# Patient Record
Sex: Male | Born: 1965
Health system: Southern US, Community
[De-identification: ages and names within clinical notes are randomized; demographics above are authoritative.]

## PROBLEM LIST (undated history)

## (undated) ENCOUNTER — Emergency Department (HOSPITAL_COMMUNITY): Admission: EM | Payer: Medicaid Other | Source: Home / Self Care

## (undated) DIAGNOSIS — R519 Headache, unspecified: Secondary | ICD-10-CM

## (undated) DIAGNOSIS — S51002A Unspecified open wound of left elbow, initial encounter: Secondary | ICD-10-CM

## (undated) DIAGNOSIS — K219 Gastro-esophageal reflux disease without esophagitis: Secondary | ICD-10-CM

## (undated) DIAGNOSIS — F419 Anxiety disorder, unspecified: Secondary | ICD-10-CM

## (undated) DIAGNOSIS — F319 Bipolar disorder, unspecified: Secondary | ICD-10-CM

## (undated) DIAGNOSIS — I700 Atherosclerosis of aorta: Secondary | ICD-10-CM

## (undated) DIAGNOSIS — F909 Attention-deficit hyperactivity disorder, unspecified type: Secondary | ICD-10-CM

## (undated) DIAGNOSIS — S53105A Unspecified dislocation of left ulnohumeral joint, initial encounter: Secondary | ICD-10-CM

## (undated) DIAGNOSIS — T8182XA Emphysema (subcutaneous) resulting from a procedure, initial encounter: Secondary | ICD-10-CM

## (undated) DIAGNOSIS — J439 Emphysema, unspecified: Secondary | ICD-10-CM

## (undated) DIAGNOSIS — R51 Headache: Secondary | ICD-10-CM

## (undated) DIAGNOSIS — T40601A Poisoning by unspecified narcotics, accidental (unintentional), initial encounter: Secondary | ICD-10-CM

## (undated) DIAGNOSIS — W5911XA Bitten by nonvenomous snake, initial encounter: Secondary | ICD-10-CM

## (undated) DIAGNOSIS — Z5189 Encounter for other specified aftercare: Secondary | ICD-10-CM

## (undated) DIAGNOSIS — R0600 Dyspnea, unspecified: Secondary | ICD-10-CM

## (undated) DIAGNOSIS — F32A Depression, unspecified: Secondary | ICD-10-CM

## (undated) DIAGNOSIS — J45909 Unspecified asthma, uncomplicated: Secondary | ICD-10-CM

## (undated) DIAGNOSIS — W3400XA Accidental discharge from unspecified firearms or gun, initial encounter: Secondary | ICD-10-CM

## (undated) DIAGNOSIS — I4891 Unspecified atrial fibrillation: Secondary | ICD-10-CM

## (undated) DIAGNOSIS — J449 Chronic obstructive pulmonary disease, unspecified: Secondary | ICD-10-CM

## (undated) HISTORY — DX: Emphysema, unspecified: J43.9

## (undated) HISTORY — PX: COLON SURGERY: SHX602

## (undated) HISTORY — DX: Encounter for other specified aftercare: Z51.89

## (undated) HISTORY — DX: Atherosclerosis of aorta: I70.0

## (undated) HISTORY — PX: LUNG SURGERY: SHX703

## (undated) HISTORY — PX: SPINE SURGERY: SHX786

## (undated) HISTORY — PX: HERNIA REPAIR: SHX51

## (undated) HISTORY — PX: FRACTURE SURGERY: SHX138

## (undated) HISTORY — DX: Unspecified dislocation of left ulnohumeral joint, initial encounter: S53.105A

## (undated) HISTORY — DX: Anxiety disorder, unspecified: F41.9

## (undated) HISTORY — DX: Depression, unspecified: F32.A

## (undated) HISTORY — DX: Unspecified dislocation of left ulnohumeral joint, initial encounter: S51.002A

## (undated) HISTORY — DX: Gastro-esophageal reflux disease without esophagitis: K21.9

---
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
## 1998-07-13 ENCOUNTER — Encounter: Payer: Self-pay | Admitting: General Surgery

## 1998-07-14 ENCOUNTER — Ambulatory Visit (HOSPITAL_COMMUNITY): Admission: RE | Admit: 1998-07-14 | Discharge: 1998-07-14 | Payer: Self-pay | Admitting: General Surgery

## 2000-01-30 ENCOUNTER — Emergency Department (HOSPITAL_COMMUNITY): Admission: EM | Admit: 2000-01-30 | Discharge: 2000-01-30 | Payer: Self-pay | Admitting: Emergency Medicine

## 2000-01-30 ENCOUNTER — Encounter: Payer: Self-pay | Admitting: Emergency Medicine

## 2000-04-09 ENCOUNTER — Emergency Department (HOSPITAL_COMMUNITY): Admission: EM | Admit: 2000-04-09 | Discharge: 2000-04-09 | Payer: Self-pay

## 2002-09-21 ENCOUNTER — Emergency Department (HOSPITAL_COMMUNITY): Admission: EM | Admit: 2002-09-21 | Discharge: 2002-09-21 | Payer: Self-pay

## 2004-02-12 ENCOUNTER — Emergency Department (HOSPITAL_COMMUNITY): Admission: EM | Admit: 2004-02-12 | Discharge: 2004-02-12 | Payer: Self-pay | Admitting: Emergency Medicine

## 2004-02-12 ENCOUNTER — Emergency Department (HOSPITAL_COMMUNITY): Admission: EM | Admit: 2004-02-12 | Discharge: 2004-02-13 | Payer: Self-pay | Admitting: Emergency Medicine
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
## 2004-04-03 ENCOUNTER — Emergency Department (HOSPITAL_COMMUNITY): Admission: EM | Admit: 2004-04-03 | Discharge: 2004-04-03 | Payer: Self-pay | Admitting: Emergency Medicine
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
## 2005-06-23 ENCOUNTER — Emergency Department (HOSPITAL_COMMUNITY): Admission: EM | Admit: 2005-06-23 | Discharge: 2005-06-23 | Payer: Self-pay | Admitting: Emergency Medicine

## 2010-07-19 ENCOUNTER — Emergency Department (HOSPITAL_COMMUNITY)
Admission: EM | Admit: 2010-07-19 | Discharge: 2010-07-19 | Payer: Self-pay | Source: Home / Self Care | Admitting: Emergency Medicine

## 2010-07-19 IMAGING — CR DG WRIST COMPLETE 3+V*L*
4 series · 4 of 4 positions shown · non-contrast
Comparison: None.

CLINICAL DATA: Left wrist injury with pain.

LEFT WRIST - COMPLETE 3+ VIEW

[x wrist pa left]
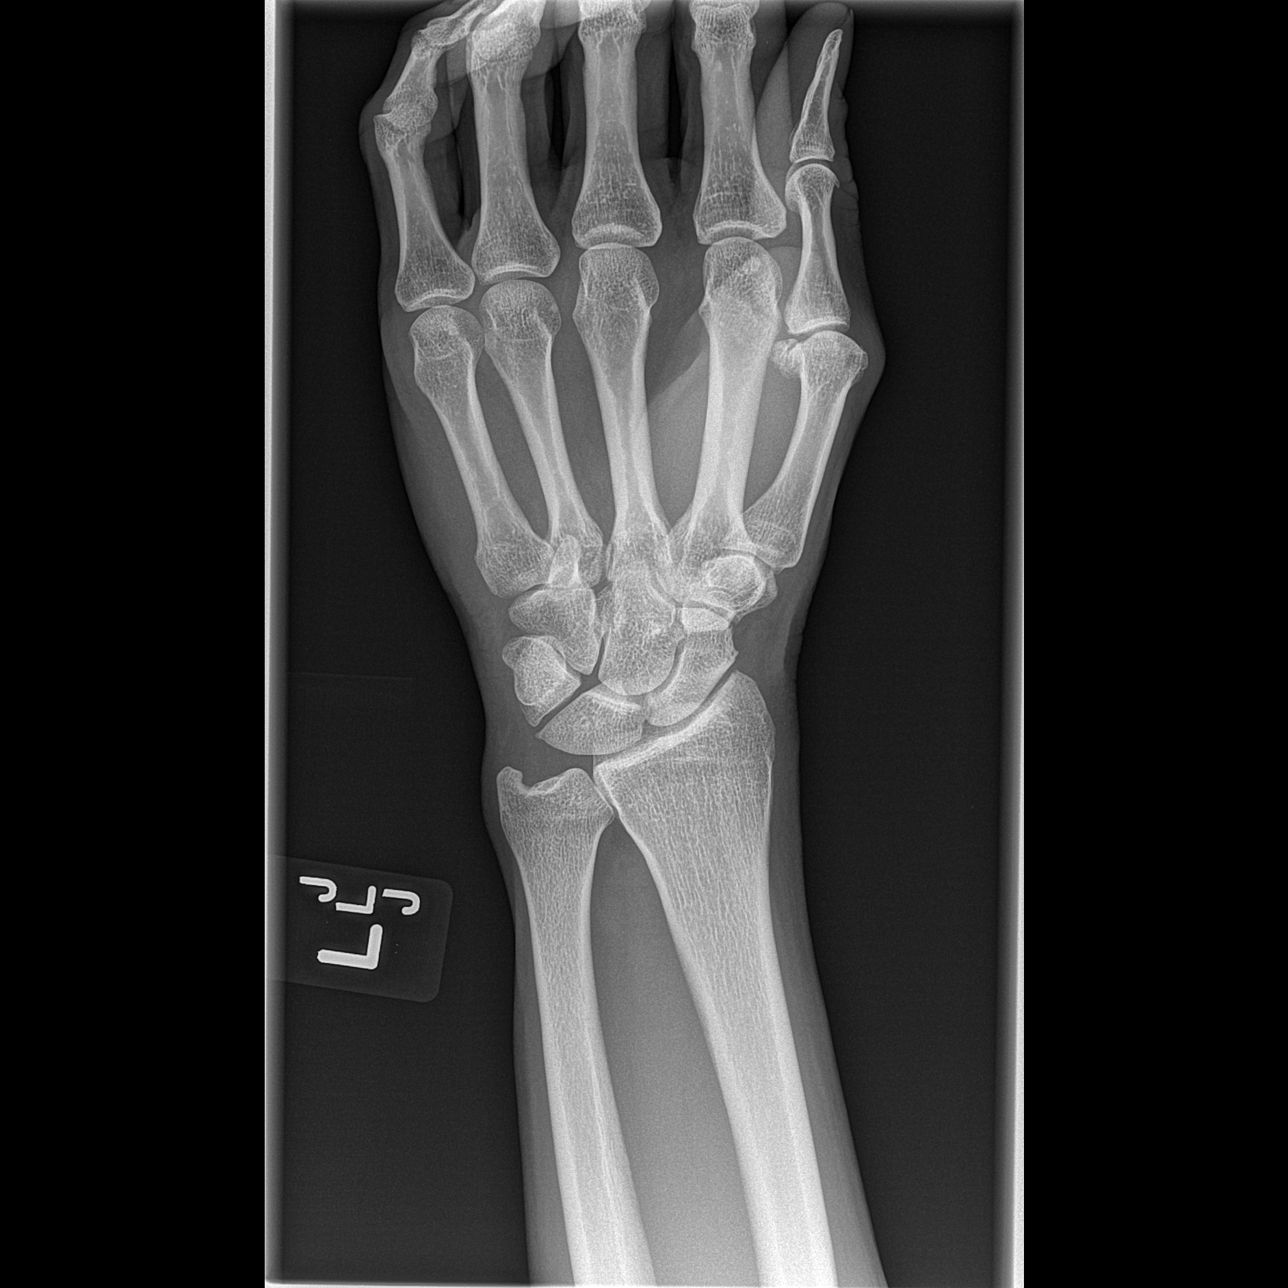

[x wrist navicular]
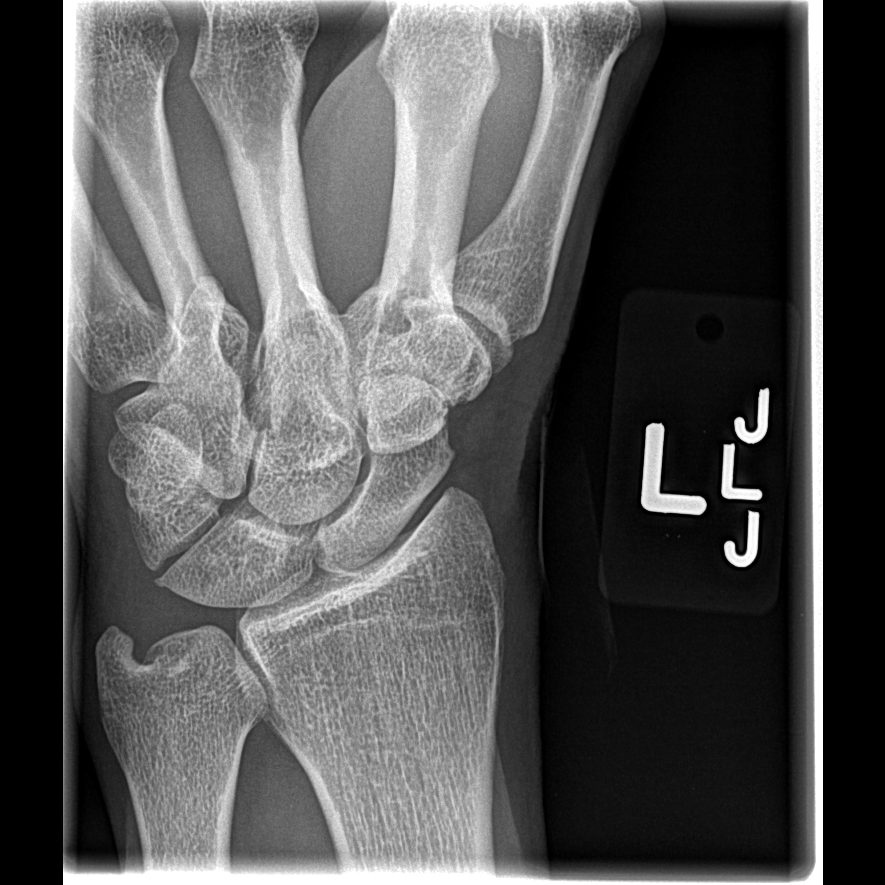

[x wrist obl left]
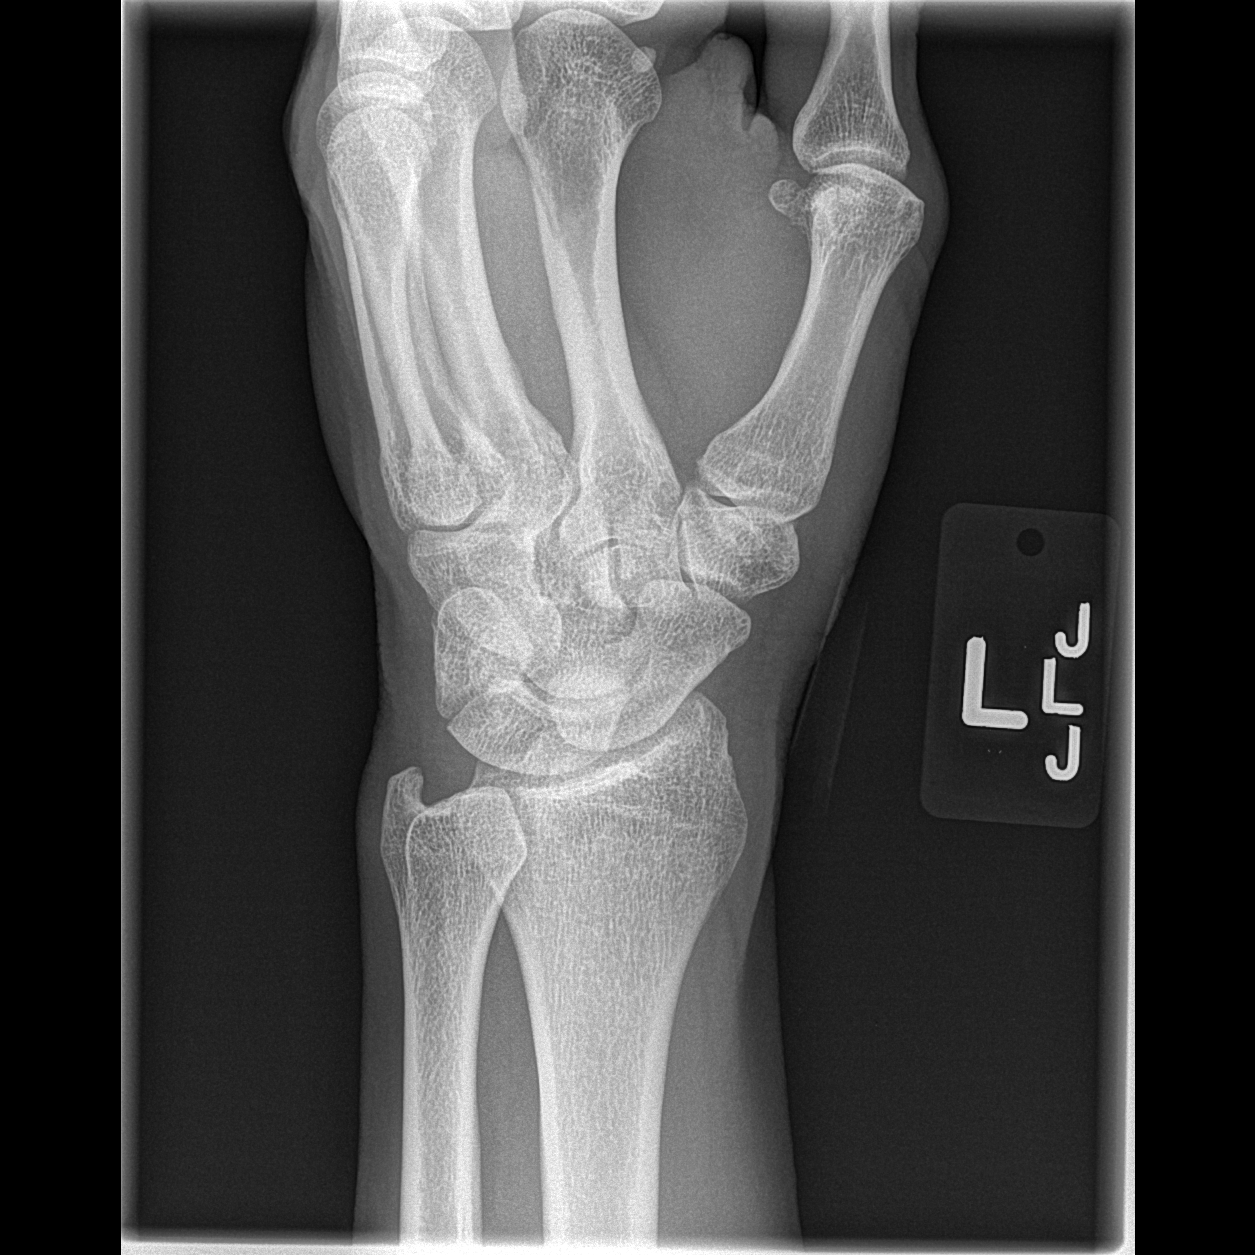

[x wrist lat left]
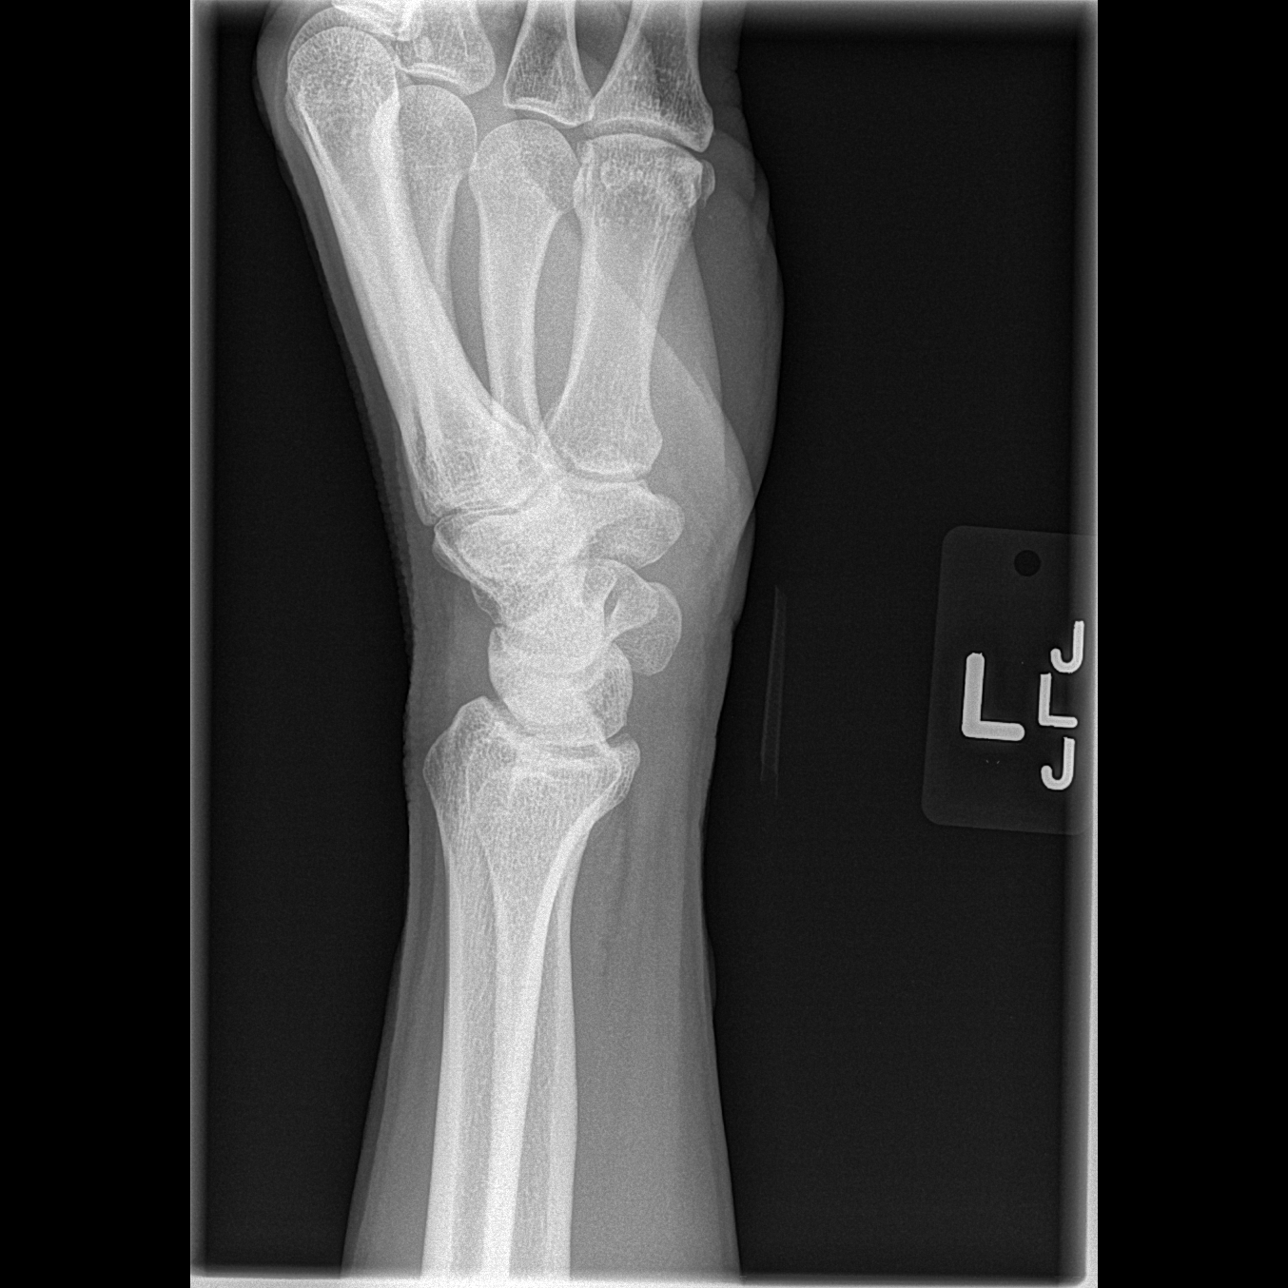

[4 of 4 positions shown; findings below may reference images not displayed]

FINDINGS: No acute osseous or joint abnormality.  Minimal
degenerative change at the scaphoid trapezium trapezoid and first
carpometacarpal joints.
IMPRESSION: No acute osseous or joint abnormality.  Minimal degenerative change
at the scaphoid trapezium trapezoid and first carpometacarpal
joints.
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
## 2010-11-21 ENCOUNTER — Emergency Department (HOSPITAL_COMMUNITY)
Admission: EM | Admit: 2010-11-21 | Discharge: 2010-11-22 | Payer: Self-pay | Source: Home / Self Care | Admitting: Emergency Medicine

## 2010-11-22 IMAGING — CR DG CHEST 2V
2 series · 2 of 2 positions shown · non-contrast
Comparison: None

CLINICAL DATA: Shortness of breath

CHEST - 2 VIEW

[w chest pa]
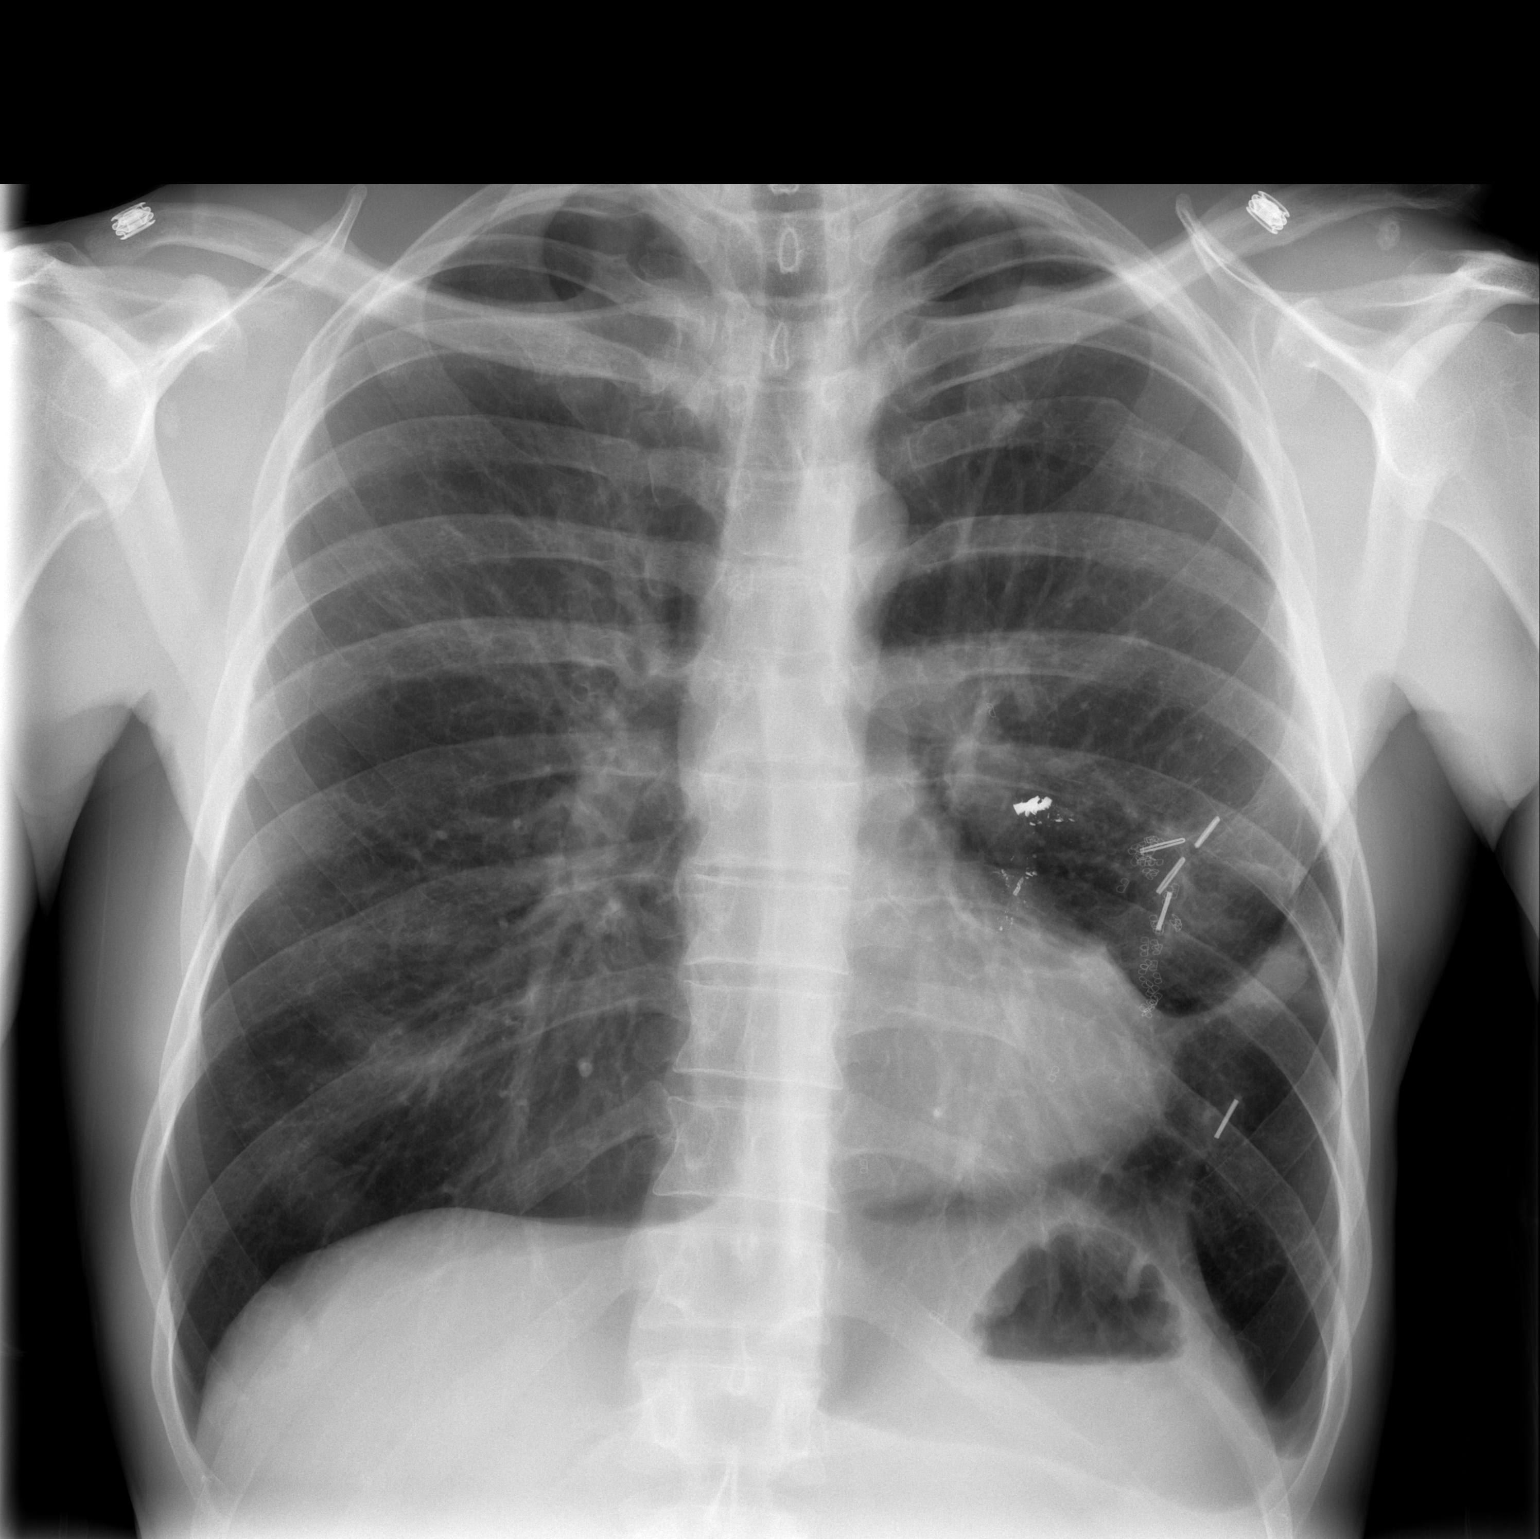

[w chest lat]
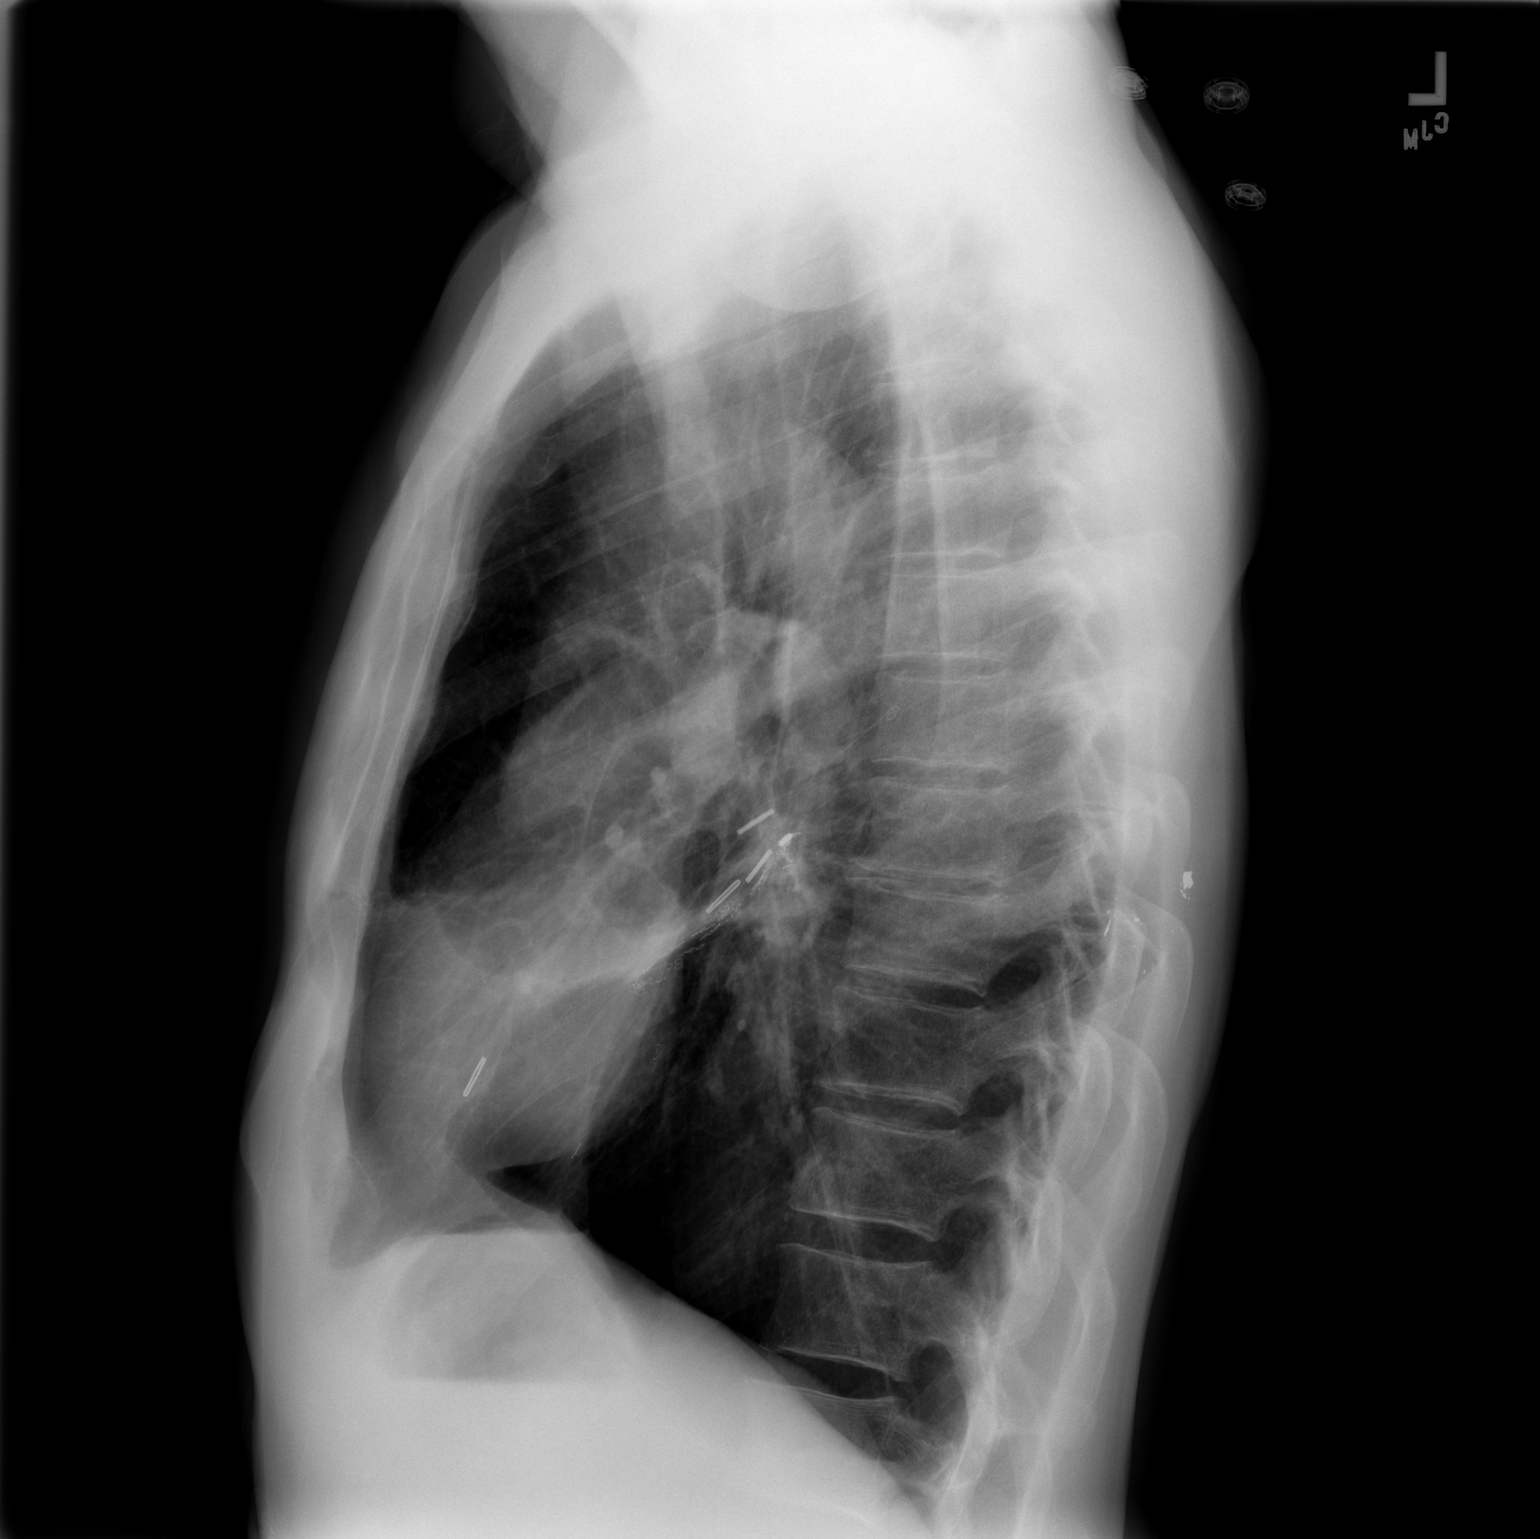

[2 of 2 positions shown; findings below may reference images not displayed]

FINDINGS: The cardiomediastinal silhouette is unremarkable.
Postoperative changes in the left chest are noted with scarring.
There is no evidence of focal airspace disease, pulmonary edema,
pulmonary nodule/mass, pleural effusion, or pneumothorax.
No acute bony abnormalities are identified.
IMPRESSION: No evidence of acute cardiopulmonary disease.

## 2010-11-25 ENCOUNTER — Emergency Department (HOSPITAL_COMMUNITY)
Admission: EM | Admit: 2010-11-25 | Discharge: 2010-11-25 | Payer: Self-pay | Source: Home / Self Care | Admitting: Emergency Medicine

## 2010-11-27 LAB — BASIC METABOLIC PANEL
BUN: 8 mg/dL (ref 6–23)
CO2: 22 mEq/L (ref 19–32)
Calcium: 9.9 mg/dL (ref 8.4–10.5)
Chloride: 108 mEq/L (ref 96–112)
Creatinine, Ser: 1.02 mg/dL (ref 0.4–1.5)
GFR calc Af Amer: >60 mL/min (ref >60)
GFR calc non Af Amer: >60 mL/min (ref >60)
Glucose, Bld: 128 mg/dL — ABNORMAL HIGH (ref 70–99)
Potassium: 3.9 mEq/L (ref 3.5–5.1)
Sodium: 141 mEq/L (ref 135–145)

## 2010-11-27 LAB — POCT CARDIAC MARKERS
CKMB, poc: <1 ng/mL — ABNORMAL LOW (ref 1.0–8.0)
Myoglobin, poc: 52.8 ng/mL (ref 12–200)
Troponin i, poc: <0.05 ng/mL (ref 0.00–0.09)

## 2010-11-27 LAB — URINALYSIS, ROUTINE W REFLEX MICROSCOPIC
Bilirubin Urine: NEGATIVE
Hgb urine dipstick: NEGATIVE
Ketones, ur: NEGATIVE mg/dL
Nitrite: NEGATIVE
Protein, ur: NEGATIVE mg/dL
Specific Gravity, Urine: 1.006 (ref 1.005–1.030)
Urine Glucose, Fasting: NEGATIVE mg/dL
Urobilinogen, UA: 0.2 mg/dL (ref 0.0–1.0)
pH: 7 (ref 5.0–8.0)

## 2010-11-27 LAB — DIFFERENTIAL
Basophils Absolute: 0 10*3/uL (ref 0.0–0.1)
Basophils Relative: 0 % (ref 0–1)
Eosinophils Absolute: 0.3 10*3/uL (ref 0.0–0.7)
Eosinophils Relative: 2 % (ref 0–5)
Lymphocytes Relative: 22 % (ref 12–46)
Lymphs Abs: 2.7 10*3/uL (ref 0.7–4.0)
Monocytes Absolute: 0.9 10*3/uL (ref 0.1–1.0)
Monocytes Relative: 8 % (ref 3–12)
Neutro Abs: 8 10*3/uL — ABNORMAL HIGH (ref 1.7–7.7)
Neutrophils Relative %: 67 % (ref 43–77)

## 2010-11-27 LAB — CBC
HCT: 45 % (ref 39.0–52.0)
Hemoglobin: 16.3 g/dL (ref 13.0–17.0)
MCH: 32.5 pg (ref 26.0–34.0)
MCHC: 36.2 g/dL — ABNORMAL HIGH (ref 30.0–36.0)
MCV: 89.8 fL (ref 78.0–100.0)
Platelets: 244 10*3/uL (ref 150–400)
RBC: 5.01 MIL/uL (ref 4.22–5.81)
RDW: 12.3 % (ref 11.5–15.5)
WBC: 11.9 10*3/uL — ABNORMAL HIGH (ref 4.0–10.5)

## 2010-11-27 LAB — GLUCOSE, CAPILLARY: Glucose-Capillary: 133 mg/dL — ABNORMAL HIGH (ref 70–99)

## 2010-11-27 LAB — RAPID URINE DRUG SCREEN, HOSP PERFORMED
Amphetamines: NOT DETECTED
Barbiturates: NOT DETECTED
Benzodiazepines: POSITIVE — AB
Cocaine: NOT DETECTED
Opiates: NOT DETECTED
Tetrahydrocannabinol: POSITIVE — AB

## 2010-12-02 ENCOUNTER — Emergency Department (HOSPITAL_COMMUNITY)
Admission: EM | Admit: 2010-12-02 | Discharge: 2010-12-02 | Payer: Self-pay | Source: Home / Self Care | Admitting: Emergency Medicine
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
## 2010-12-05 LAB — DIFFERENTIAL
Basophils Absolute: 0 10*3/uL (ref 0.0–0.1)
Basophils Relative: 0 % (ref 0–1)
Eosinophils Absolute: 0.3 10*3/uL (ref 0.0–0.7)
Eosinophils Relative: 2 % (ref 0–5)
Lymphocytes Relative: 26 % (ref 12–46)
Lymphs Abs: 3 10*3/uL (ref 0.7–4.0)
Monocytes Absolute: 1 10*3/uL (ref 0.1–1.0)
Monocytes Relative: 8 % (ref 3–12)
Neutro Abs: 7.6 10*3/uL (ref 1.7–7.7)
Neutrophils Relative %: 64 % (ref 43–77)

## 2010-12-05 LAB — POCT CARDIAC MARKERS
CKMB, poc: <1 ng/mL — ABNORMAL LOW (ref 1.0–8.0)
Myoglobin, poc: 54.2 ng/mL (ref 12–200)
Troponin i, poc: <0.05 ng/mL (ref 0.00–0.09)

## 2010-12-05 LAB — CBC
HCT: 42.6 % (ref 39.0–52.0)
Hemoglobin: 14.5 g/dL (ref 13.0–17.0)
MCH: 31 pg (ref 26.0–34.0)
MCHC: 34 g/dL (ref 30.0–36.0)
MCV: 91 fL (ref 78.0–100.0)
Platelets: 281 10*3/uL (ref 150–400)
RBC: 4.68 MIL/uL (ref 4.22–5.81)
RDW: 11.8 % (ref 11.5–15.5)
WBC: 11.9 10*3/uL — ABNORMAL HIGH (ref 4.0–10.5)

## 2010-12-05 LAB — POCT I-STAT, CHEM 8
BUN: 16 mg/dL (ref 6–23)
Calcium, Ion: 1.14 mmol/L (ref 1.12–1.32)
Chloride: 106 mEq/L (ref 96–112)
Creatinine, Ser: 0.9 mg/dL (ref 0.4–1.5)
Glucose, Bld: 118 mg/dL — ABNORMAL HIGH (ref 70–99)
HCT: 44 % (ref 39.0–52.0)
Hemoglobin: 15 g/dL (ref 13.0–17.0)
Potassium: 5.1 mEq/L (ref 3.5–5.1)
Sodium: 140 mEq/L (ref 135–145)
TCO2: 29 mmol/L (ref 0–100)

## 2011-10-19 ENCOUNTER — Emergency Department (HOSPITAL_COMMUNITY)
Admission: EM | Admit: 2011-10-19 | Discharge: 2011-10-19 | Disposition: A | Discharge status: Home / Self Care | Payer: Self-pay | Attending: Emergency Medicine | Admitting: Emergency Medicine

## 2011-10-19 ENCOUNTER — Encounter: Payer: Self-pay | Admitting: Physical Medicine and Rehabilitation

## 2011-10-19 DIAGNOSIS — K029 Dental caries, unspecified: Secondary | ICD-10-CM | POA: Insufficient documentation

## 2011-10-19 DIAGNOSIS — K089 Disorder of teeth and supporting structures, unspecified: Secondary | ICD-10-CM | POA: Insufficient documentation

## 2011-10-19 MED ORDER — OXYCODONE-ACETAMINOPHEN 5-325 MG PO TABS
2.0000 | ORAL_TABLET | Freq: Once | ORAL | Status: AC
  Administered 2011-10-19: 2 via ORAL
  Filled 2011-10-19: qty 2

## 2011-10-19 MED ORDER — HYDROCODONE-ACETAMINOPHEN 5-325 MG PO TABS: 1.0000 | ORAL_TABLET | ORAL | Status: AC | PRN

## 2011-10-19 MED ORDER — PENICILLIN V POTASSIUM 250 MG PO TABS: 250.0000 mg | ORAL_TABLET | Freq: Four times a day (QID) | ORAL | Status: AC

## 2011-10-19 NOTE — ED Notes (Signed)
Pt presents to department for evaluation of L upper molar toothache. Pain began yesterday. Pt states he chipped his filling. 9/10 "throbbing" pain. Respirations unlabored. Pt conscious alert and oriented x4. No signs of distress at the time.

## 2011-10-19 NOTE — ED Provider Notes (Signed)
History     CSN: [id number] Arrival date & time: 10/19/2011  6:07 PM   First MD Initiated Contact with Patient 10/19/11 1854      Chief Complaint  Patient presents with  . Dental Pain    (Consider location/radiation/quality/duration/timing/severity/associated sxs/prior treatment) Patient is a 45 y.o. male presenting with tooth pain. The history is provided by the patient.  Dental PainThe primary symptoms include mouth pain. The symptoms began 3 to 5 days ago. The symptoms are worsening. The symptoms are recurrent. The symptoms occur intermittently.  Additional symptoms include: dental sensitivity to temperature and gum tenderness.  Pain is located along upper right maxilla at site of several teeth with dental caries.  No past medical history on file.  No past surgical history on file.  No family history on file.  History  Substance Use Topics  . Smoking status: Former Games developer  . Smokeless tobacco: Not on file  . Alcohol Use: No      Review of Systems  All other systems reviewed and are negative.    Allergies  Review of patient's allergies indicates no known allergies.  Home Medications   Current Outpatient Rx  Name Route Sig Dispense Refill  . ALBUTEROL SULFATE HFA 108 (90 BASE) MCG/ACT IN AERS Inhalation Inhale 2 puffs into the lungs every 6 (six) hours as needed. For wheezing/shortness of breath     . BC HEADACHE POWDER PO Oral Take 1 packet by mouth 4 (four) times daily as needed. For tooth pain       BP 122/85  Pulse 94  Temp(Src) 98 F (36.7 C) (Oral)  Resp 20  SpO2 96%  Physical Exam  Nursing note and vitals reviewed. Constitutional: He appears well-developed and well-nourished.  HENT:  Head: Normocephalic.  Mouth/Throat: Dental caries present.    Eyes: Pupils are equal, round, and reactive to light.  Neck: Normal range of motion. Neck supple.  Cardiovascular: Normal rate and normal heart sounds.   Pulmonary/Chest: Effort normal and breath  sounds normal.  Abdominal: Soft. Bowel sounds are normal.  Musculoskeletal: Normal range of motion.  Neurological: He is alert.  Skin: Skin is warm and dry.  Psychiatric: He has a normal mood and affect.    ED Course  Procedures (including critical care time)  Labs Reviewed - No data to display No results found.   No diagnosis found.    MDM          Jimmye Norman, NP 10/19/11 2016

## 2011-10-19 NOTE — ED Notes (Signed)
I gave the patient a hot pack to put on his face.

## 2011-10-22 NOTE — ED Provider Notes (Signed)
Medical screening examination/treatment/procedure(s) were performed by non-physician practitioner and as supervising physician I was immediately available for consultation/collaboration.  Juliet Rude. Rubin Payor, MD 10/22/11 [phone]

## 2014-02-10 ENCOUNTER — Emergency Department (HOSPITAL_COMMUNITY)
Admission: EM | Admit: 2014-02-10 | Discharge: 2014-02-10 | Disposition: A | Discharge status: Home / Self Care | Payer: Medicaid Other | Attending: Emergency Medicine | Admitting: Emergency Medicine

## 2014-02-10 ENCOUNTER — Encounter (HOSPITAL_COMMUNITY): Payer: Self-pay | Admitting: Emergency Medicine

## 2014-02-10 DIAGNOSIS — Z87891 Personal history of nicotine dependence: Secondary | ICD-10-CM | POA: Insufficient documentation

## 2014-02-10 DIAGNOSIS — Z87828 Personal history of other (healed) physical injury and trauma: Secondary | ICD-10-CM | POA: Insufficient documentation

## 2014-02-10 DIAGNOSIS — J45901 Unspecified asthma with (acute) exacerbation: Secondary | ICD-10-CM

## 2014-02-10 DIAGNOSIS — Z79899 Other long term (current) drug therapy: Secondary | ICD-10-CM | POA: Insufficient documentation

## 2014-02-10 DIAGNOSIS — J441 Chronic obstructive pulmonary disease with (acute) exacerbation: Secondary | ICD-10-CM | POA: Insufficient documentation

## 2014-02-10 DIAGNOSIS — IMO0002 Reserved for concepts with insufficient information to code with codable children: Secondary | ICD-10-CM | POA: Insufficient documentation

## 2014-02-10 HISTORY — DX: Unspecified asthma, uncomplicated: J45.909

## 2014-02-10 HISTORY — DX: Chronic obstructive pulmonary disease, unspecified: J44.9

## 2014-02-10 HISTORY — DX: Accidental discharge from unspecified firearms or gun, initial encounter: W34.00XA

## 2014-02-10 LAB — CBC
HCT: 44.3 % (ref 39.0–52.0)
Hemoglobin: 15.9 g/dL (ref 13.0–17.0)
MCH: 31.9 pg (ref 26.0–34.0)
MCHC: 35.9 g/dL (ref 30.0–36.0)
MCV: 88.8 fL (ref 78.0–100.0)
Platelets: 184 10*3/uL (ref 150–400)
RBC: 4.99 MIL/uL (ref 4.22–5.81)
RDW: 12.2 % (ref 11.5–15.5)
WBC: 6.6 10*3/uL (ref 4.0–10.5)

## 2014-02-10 LAB — BASIC METABOLIC PANEL
BUN: 9 mg/dL (ref 6–23)
CO2: 21 mEq/L (ref 19–32)
Calcium: 9.1 mg/dL (ref 8.4–10.5)
Chloride: 102 mEq/L (ref 96–112)
Creatinine, Ser: 0.89 mg/dL (ref 0.50–1.35)
Glucose, Bld: 106 mg/dL — ABNORMAL HIGH (ref 70–99)
Potassium: 4.2 mEq/L (ref 3.7–5.3)
SODIUM: 137 meq/L (ref 137–147)

## 2014-02-10 LAB — I-STAT TROPONIN, ED: Troponin i, poc: 0 ng/mL (ref 0.00–0.08)

## 2014-02-10 MED ORDER — ALBUTEROL SULFATE HFA 108 (90 BASE) MCG/ACT IN AERS
2.0000 | INHALATION_SPRAY | RESPIRATORY_TRACT | Status: DC | PRN
  Administered 2014-02-10: 2 via RESPIRATORY_TRACT
  Filled 2014-02-10: qty 6.7

## 2014-02-10 MED ORDER — PREDNISONE 20 MG PO TABS
60.0000 mg | ORAL_TABLET | Freq: Once | ORAL | Status: AC
  Administered 2014-02-10: 60 mg via ORAL
  Filled 2014-02-10: qty 3

## 2014-02-10 MED ORDER — IPRATROPIUM BROMIDE 0.02 % IN SOLN
0.5000 mg | Freq: Once | RESPIRATORY_TRACT | Status: AC
  Administered 2014-02-10: 0.5 mg via RESPIRATORY_TRACT
  Filled 2014-02-10: qty 2.5

## 2014-02-10 MED ORDER — ALBUTEROL SULFATE (2.5 MG/3ML) 0.083% IN NEBU: INHALATION_SOLUTION | RESPIRATORY_TRACT | Status: DC

## 2014-02-10 MED ORDER — ALBUTEROL SULFATE (2.5 MG/3ML) 0.083% IN NEBU
5.0000 mg | INHALATION_SOLUTION | Freq: Once | RESPIRATORY_TRACT | Status: AC
  Administered 2014-02-10: 5 mg via RESPIRATORY_TRACT
  Filled 2014-02-10: qty 6

## 2014-02-10 MED ORDER — PREDNISONE 20 MG PO TABS: ORAL_TABLET | ORAL | Status: DC

## 2014-02-10 NOTE — ED Notes (Signed)
Has run out of asthma meds z 1 week having cp also hurts to breath in and out
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
## 2014-02-10 NOTE — Discharge Instructions (Signed)
Use the inhaler and nebulizer for your wheezing. Take the prednisone until gone. Recheck if you get a fever, or you seem worse.    Asthma, Adult Asthma is a recurring condition in which the airways tighten and narrow. Asthma can make it difficult to breathe. It can cause coughing, wheezing, and shortness of breath. Asthma episodes (also called asthma attacks) range from minor to life-threatening. Asthma cannot be cured, but medicines and lifestyle changes can help control it. CAUSES Asthma is believed to be caused by inherited (genetic) and environmental factors, but its exact cause is unknown. Asthma may be triggered by allergens, lung infections, or irritants in the air. Asthma triggers are different for each person. Common triggers include:   Animal dander.  Dust mites.  Cockroaches.  Pollen from trees or grass.  Mold.  Smoke.  Air pollutants such as dust, household cleaners, hair sprays, aerosol sprays, paint fumes, strong chemicals, or strong odors.  Cold air, weather changes, and winds (which increase molds and pollens in the air).  Strong emotional expressions such as crying or laughing hard.  Stress.  Certain medicines (such as aspirin) or types of drugs (such as beta-blockers).  Sulfites in foods and drinks. Foods and drinks that may contain sulfites include dried fruit, potato chips, and sparkling grape juice.  Infections or inflammatory conditions such as the flu, a cold, or an inflammation of the nasal membranes (rhinitis).  Gastroesophageal reflux disease (GERD).  Exercise or strenuous activity. SYMPTOMS Symptoms may occur immediately after asthma is triggered or many hours later. Symptoms include:  Wheezing.  Excessive nighttime or early morning coughing.  Frequent or severe coughing with a common cold.  Chest tightness.  Shortness of breath. DIAGNOSIS  The diagnosis of asthma is made by a review of your medical history and a physical exam. Tests may  also be performed. These may include:  Lung function studies. These tests show how much air you breath in and out.  Allergy tests.  Imaging tests such as X-rays. TREATMENT  Asthma cannot be cured, but it can usually be controlled. Treatment involves identifying and avoiding your asthma triggers. It also involves medicines. There are 2 classes of medicine used for asthma treatment:   Controller medicines. These prevent asthma symptoms from occurring. They are usually taken every day.  Reliever or rescue medicines. These quickly relieve asthma symptoms. They are used as needed and provide short-term relief. Your health care provider will help you create an asthma action plan. An asthma action plan is a written plan for managing and treating your asthma attacks. It includes a list of your asthma triggers and how they may be avoided. It also includes information on when medicines should be taken and when their dosage should be changed. An action plan may also involve the use of a device called a peak flow meter. A peak flow meter measures how well the lungs are working. It helps you monitor your condition. HOME CARE INSTRUCTIONS   Take medicine as directed by your health care provider. Speak with your health care provider if you have questions about how or when to take the medicines.  Use a peak flow meter as directed by your health care provider. Record and keep track of readings.  Understand and use the action plan to help minimize or stop an asthma attack without needing to seek medical care.  Control your home environment in the following ways to help prevent asthma attacks:  Do not smoke. Avoid being exposed to secondhand smoke.  Change your heating and air conditioning filter regularly.  Limit your use of fireplaces and wood stoves.  Get rid of pests (such as roaches and mice) and their droppings.  Throw away plants if you see mold on them.  Clean your floors and dust regularly.  Use unscented cleaning products.  Try to have someone else vacuum for you regularly. Stay out of rooms while they are being vacuumed and for a short while afterward. If you vacuum, use a dust mask from a hardware store, a double-layered or microfilter vacuum cleaner bag, or a vacuum cleaner with a HEPA filter.  Replace carpet with wood, tile, or vinyl flooring. Carpet can trap dander and dust.  Use allergy-proof pillows, mattress covers, and box spring covers.  Wash bed sheets and blankets every week in hot water and dry them in a dryer.  Use blankets that are made of polyester or cotton.  Clean bathrooms and kitchens with bleach. If possible, have someone repaint the walls in these rooms with mold-resistant paint. Keep out of the rooms that are being cleaned and painted.  Wash hands frequently. SEEK MEDICAL CARE IF:   You have wheezing, shortness of breath, or a cough even if taking medicine to prevent attacks.  The colored mucus you cough up (sputum) is thicker than usual.  Your sputum changes from clear or white to yellow, green, gray, or bloody.  You have any problems that may be related to the medicines you are taking (such as a rash, itching, swelling, or trouble breathing).  You are using a reliever medicine more than 2 3 times per week.  Your peak flow is still at 50 79% of you personal best after following your action plan for 1 hour. SEEK IMMEDIATE MEDICAL CARE IF:   You seem to be getting worse and are unresponsive to treatment during an asthma attack.  You are short of breath even at rest.  You get short of breath when doing very little physical activity.  You have difficulty eating, drinking, or talking due to asthma symptoms.  You develop chest pain.  You develop a fast heartbeat.  You have a bluish color to your lips or fingernails.  You are lightheaded, dizzy, or faint.  Your peak flow is less than 50% of your personal best.  You have a fever or  persistent symptoms for more than 2 3 days.  You have a fever and symptoms suddenly get worse. MAKE SURE YOU:   Understand these instructions.  Will watch your condition.  Will get help right away if you are not doing well or get worse. Document Released: 10/29/2005 Document Revised: 07/01/2013 Document Reviewed: 05/28/2013 [facility] Patient Information 2014 [place], [place].

## 2014-02-10 NOTE — Progress Notes (Signed)
   CARE MANAGEMENT ED NOTE 02/10/2014  Patient:  Kenneth Mcdowell, Kenneth Mcdowell   Account Number:  [id number]  Date Initiated:  02/10/2014  Documentation initiated by:  [facility]  Subjective/Objective Assessment:   Patient presented to [facility] ED with SOB COPD     Subjective/Objective Assessment Detail:     Action/Plan:   Action/Plan Detail:   Anticipated DC Date:  02/10/2014     Status Recommendation to Physician:   Result of Recommendation:  Agreed    DC Planning Services  CM consult    Choice offered to / List presented to:  C-1 Patient          Status of service:  Completed, signed off  ED Comments:   ED Comments Detail:  02/10/2014 15:03 . Stann Mainland RN BSN [phone] ED CM consulted concerning medication assistance. Pt presented to [facility] ED with SOB, COPD. Pt has an out of state medicaid as payor source. Pt is states he ran out of his rescue inhaler while here., He is from [place]. Met with patient at bedside, he reports he is here for a couple of weeks assisting his elderly mother and will be returning to [place]. He is requesting medication assistance due to the prices of inhalers. Patient in need of Albuterol nebulizers for machine.  Discussed nebulizers 1 box of 25 on [facility] $4 list. Pt verbalized understanding and is agreeable with plan. Discussed plan with Dr. Tomi Bamberger, and providing patient with Albuterol inhaler upon discharge. Dr. Tomi Bamberger agreeable to discharge. No further ED CM needs identified.

## 2014-02-10 NOTE — ED Notes (Signed)
Pt reports he feels a lot better after the first neb treatment but is still feeling a little tight.

## 2014-03-04 ENCOUNTER — Emergency Department (HOSPITAL_COMMUNITY)
Admission: EM | Admit: 2014-03-04 | Discharge: 2014-03-04 | Disposition: A | Discharge status: Home / Self Care | Payer: Self-pay | Attending: Emergency Medicine | Admitting: Emergency Medicine

## 2014-03-04 ENCOUNTER — Encounter (HOSPITAL_COMMUNITY): Payer: Self-pay | Admitting: Emergency Medicine

## 2014-03-04 ENCOUNTER — Emergency Department (HOSPITAL_COMMUNITY): Payer: Medicaid Other

## 2014-03-04 ENCOUNTER — Emergency Department (HOSPITAL_COMMUNITY): Payer: Self-pay

## 2014-03-04 DIAGNOSIS — R222 Localized swelling, mass and lump, trunk: Secondary | ICD-10-CM | POA: Insufficient documentation

## 2014-03-04 DIAGNOSIS — J441 Chronic obstructive pulmonary disease with (acute) exacerbation: Secondary | ICD-10-CM | POA: Insufficient documentation

## 2014-03-04 DIAGNOSIS — Z87891 Personal history of nicotine dependence: Secondary | ICD-10-CM | POA: Insufficient documentation

## 2014-03-04 DIAGNOSIS — R918 Other nonspecific abnormal finding of lung field: Secondary | ICD-10-CM

## 2014-03-04 DIAGNOSIS — Z9889 Other specified postprocedural states: Secondary | ICD-10-CM | POA: Insufficient documentation

## 2014-03-04 DIAGNOSIS — Z87828 Personal history of other (healed) physical injury and trauma: Secondary | ICD-10-CM | POA: Insufficient documentation

## 2014-03-04 DIAGNOSIS — Z79899 Other long term (current) drug therapy: Secondary | ICD-10-CM | POA: Insufficient documentation

## 2014-03-04 DIAGNOSIS — J45901 Unspecified asthma with (acute) exacerbation: Secondary | ICD-10-CM

## 2014-03-04 LAB — CBC
HCT: 43.8 % (ref 39.0–52.0)
HEMOGLOBIN: 15.5 g/dL (ref 13.0–17.0)
MCH: 31.6 pg (ref 26.0–34.0)
MCHC: 35.4 g/dL (ref 30.0–36.0)
MCV: 89.2 fL (ref 78.0–100.0)
Platelets: 190 10*3/uL (ref 150–400)
RBC: 4.91 MIL/uL (ref 4.22–5.81)
RDW: 12.5 % (ref 11.5–15.5)
WBC: 12.1 10*3/uL — ABNORMAL HIGH (ref 4.0–10.5)

## 2014-03-04 LAB — BASIC METABOLIC PANEL
BUN: 11 mg/dL (ref 6–23)
CO2: 22 meq/L (ref 19–32)
Calcium: 9.6 mg/dL (ref 8.4–10.5)
Chloride: 102 mEq/L (ref 96–112)
Creatinine, Ser: 1.13 mg/dL (ref 0.50–1.35)
GFR calc Af Amer: 88 mL/min — ABNORMAL LOW (ref >90)
GFR, EST NON AFRICAN AMERICAN: 76 mL/min — AB (ref >90)
Glucose, Bld: 81 mg/dL (ref 70–99)
POTASSIUM: 4 meq/L (ref 3.7–5.3)
SODIUM: 140 meq/L (ref 137–147)

## 2014-03-04 LAB — I-STAT TROPONIN, ED: TROPONIN I, POC: 0 ng/mL (ref 0.00–0.08)

## 2014-03-04 LAB — PRO B NATRIURETIC PEPTIDE: Pro B Natriuretic peptide (BNP): 39.2 pg/mL (ref 0–125)
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
## 2014-03-04 MED ORDER — METHYLPREDNISOLONE SODIUM SUCC 125 MG IJ SOLR
125.0000 mg | Freq: Once | INTRAMUSCULAR | Status: AC
  Administered 2014-03-04: 125 mg via INTRAVENOUS
  Filled 2014-03-04: qty 2

## 2014-03-04 MED ORDER — MORPHINE SULFATE 4 MG/ML IJ SOLN
4.0000 mg | Freq: Once | INTRAMUSCULAR | Status: AC
  Administered 2014-03-04: 4 mg via INTRAVENOUS
  Filled 2014-03-04: qty 1

## 2014-03-04 MED ORDER — IPRATROPIUM-ALBUTEROL 0.5-2.5 (3) MG/3ML IN SOLN
5.0000 mL | Freq: Once | RESPIRATORY_TRACT | Status: AC
  Administered 2014-03-04: 3 mL via RESPIRATORY_TRACT
  Filled 2014-03-04: qty 3

## 2014-03-04 MED ORDER — SODIUM CHLORIDE 0.9 % IV BOLUS (SEPSIS)
1000.0000 mL | Freq: Once | INTRAVENOUS | Status: AC
  Administered 2014-03-04: 1000 mL via INTRAVENOUS

## 2014-03-04 MED ORDER — ALBUTEROL SULFATE HFA 108 (90 BASE) MCG/ACT IN AERS: 1.0000 | INHALATION_SPRAY | Freq: Four times a day (QID) | RESPIRATORY_TRACT | Status: DC | PRN

## 2014-03-04 MED ORDER — PREDNISONE 20 MG PO TABS: 60.0000 mg | ORAL_TABLET | Freq: Every day | ORAL | Status: DC

## 2014-03-04 MED ORDER — ALBUTEROL SULFATE HFA 108 (90 BASE) MCG/ACT IN AERS
1.0000 | INHALATION_SPRAY | Freq: Four times a day (QID) | RESPIRATORY_TRACT | Status: DC
  Administered 2014-03-04: 2 via RESPIRATORY_TRACT
  Filled 2014-03-04: qty 6.7

## 2014-03-04 MED ORDER — IOHEXOL 350 MG/ML SOLN
100.0000 mL | Freq: Once | INTRAVENOUS | Status: AC | PRN
  Administered 2014-03-04: 100 mL via INTRAVENOUS

## 2014-03-04 MED ORDER — ALBUTEROL SULFATE (2.5 MG/3ML) 0.083% IN NEBU
5.0000 mg | INHALATION_SOLUTION | Freq: Once | RESPIRATORY_TRACT | Status: AC
  Administered 2014-03-04: 5 mg via RESPIRATORY_TRACT
  Filled 2014-03-04: qty 6

## 2014-03-04 MED ORDER — MOMETASONE FURO-FORMOTEROL FUM 100-5 MCG/ACT IN AERO: 2.0000 | INHALATION_SPRAY | Freq: Two times a day (BID) | RESPIRATORY_TRACT | Status: DC

## 2014-03-04 MED ORDER — AEROCHAMBER PLUS FLO-VU MEDIUM MISC
1.0000 | Freq: Once | Status: AC
  Administered 2014-03-04: 1
  Filled 2014-03-04: qty 1

## 2014-03-04 NOTE — ED Notes (Signed)
PA at bedside.

## 2014-03-04 NOTE — ED Notes (Signed)
Pt c/o sob and cp that started yesterday and progressively became worse today. Hx of copd, asthma and smoking. Denies being a current smoker. Pt sts he becomes more sob while laying flat and with exertion. Denies swelling in extremities. sts he has noticed that lately he has felt bilateral leg numbness when he sits for a long period of time. Nad, skin warm and dry, resp labored but equal.

## 2014-03-04 NOTE — Discharge Instructions (Signed)
Call for a follow up appointment with a Family or Primary Care Provider to make an appointment for further evaluation of your lung mass, for a PET (positron emission tomography) scan of your chest. Return if Symptoms worsen.   Take medication as prescribed.    Emergency Department Resource Guide 1) Find a Doctor and Pay Out of Pocket Although you won't have to find out who is covered by your insurance plan, it is a good idea to ask around and get recommendations. You will then need to call the office and see if the doctor you have chosen will accept you as a new patient and what types of options they offer for patients who are self-pay. Some doctors offer discounts or will set up payment plans for their patients who do not have insurance, but you will need to ask so you aren't surprised when you get to your appointment.  2) Contact Your Local Health Department Not all health departments have doctors that can see patients for sick visits, but many do, so it is worth a call to see if yours does. If you don't know where your local health department is, you can check in your phone book. The CDC also has a tool to help you locate your state's health department, and many state websites also have listings of all of their local health departments.  3) Find a [facility] If your illness is not likely to be very severe or complicated, you may want to try a walk in clinic. These are popping up all over the country in pharmacies, drugstores, and shopping centers. They're usually staffed by nurse practitioners or physician assistants that have been trained to treat common illnesses and complaints. They're usually fairly quick and inexpensive. However, if you have serious medical issues or chronic medical problems, these are probably not your best option.  No Primary Care Doctor: - Call Health Connect at  [phone] - they can help you locate a primary care doctor that  accepts your insurance, provides certain  services, etc. - Physician Referral Service- [phone]  Chronic Pain Problems: Organization         Address  Phone   Notes  [facility]  [phone] Patients need to be referred by their primary care doctor.   Medication Assistance: Organization         Address  Phone   Notes  [facility] Medication [facility] [address] [place], [place] [address] [phone] --Must be a resident of [facility] -- Must have NO insurance coverage whatsoever (no Medicaid/ Medicare, etc.) -- The pt. MUST have a primary care doctor that directs their care regularly and follows them in the community   MedAssist  [phone]   [employer]  [phone]    Agencies that provide inexpensive medical care: Organization         Address  Phone   Notes  [place]  [phone]   [facility]    [phone]   [facility] [place], [place] [address] [phone]   [place] [address], [place] [phone]   Planned Parenthood    [phone]   [facility]    [phone]   [place] and [address], [place] Phone:  [phone], Fax:  [phone] Hours of Operation:  9 am - 6 pm, M-F.  Also accepts Medicaid/Medicare and self-pay.  [facility] for [address], [address], [place] Phone: [phone], Fax: [phone]. Hours of Operation:  8:30 am - 5:30 pm, M-F.  Also accepts Medicaid and self-pay.  [facility] [address] [place] Phone: [phone]   [place], [place], [place] [phone], Ext. [phone] Mondays & Thursdays: 7-9 AM.  First 15 patients are seen on a first come, first serve basis.    [place] Providers:  Organization         Address  Phone   Notes  [facility] [address], [place] [phone] Also accepts self-pay patients.  [facility] [address] [place], [place]  [phone]   [place], [address], [place] [phone]   [facility] [address], [place] [phone]   Lucianne Lei [address], [place]   [phone] Only accepts [place] Access [place] patients after they have their name applied to their card.   Self-Pay (no insurance) in [facility] Xxx:  Organization         Address  Phone   Notes  Sickle Cell Patients, [facility] Internal Medicine [place] [phone]   [facility]gent [facility] [place] [phone]   [facility] [place]  [place], [place], [place] [phone]   [facility]/Dr. Osei-Bonsu  [address] [place] or [address], [place] [phone] Phone number for both [place] and [place] locations is the same.  Urgent Medical and [facility] [address], [place] [phone]   [facility] [address] [place] or [address] [phone]   [facility] [address] [place] [phone], phone; [phone], fax Sees patients 1st and 3rd Saturday of every month.  Must not qualify for public or private insurance (i.e. Medicaid, Medicare, [place] Health Choice, Veterans' Benefits)  Household income should be no more than 200% of the poverty level The clinic cannot treat you if you are pregnant or think you are pregnant  Sexually transmitted diseases are not treated at the clinic.    Dental Care: Organization         Address  Phone  Notes  [facility] Department of [facility] [place] [phone] Accepts  children up to age 57 who are enrolled in [place] or [place]; pregnant women with a Medicaid card; and children who have applied for Medicaid or [place] Health Choice, but were declined, whose parents can pay a reduced fee at time of service.  [facility] Department of [facility]  [address], [place] [phone] Accepts children up to age 80 who are enrolled in [place] or [place]; pregnant women with a Medicaid card; and children who have applied for Medicaid or [place] Health Choice, but were declined, whose parents can pay a reduced fee at time of service.  [facility] Adult Dental Access PROGRAM  [place] [phone] Patients are seen by appointment only. Walk-ins are not accepted. [place] will see patients 42 years of age and older. Monday - Tuesday (  8am-5pm) Most Wednesdays (8:30-5pm) $30 per visit, cash only  [facility] Adult Dental Access PROGRAM  [address], [facility] [phone] Patients are seen by appointment only. Walk-ins are not accepted. [place] will see patients 78 years of age and older. One Wednesday Evening (Monthly: Volunteer Based).  $30 per visit, cash only  [place]  [phone] for adults; Children under age 55, call Graduate Pediatric Dentistry at [phone]. Children aged 17-14, please call [phone] to request a pediatric application.  Dental services are provided in all areas of dental care including fillings, crowns and bridges, complete and partial dentures, implants, gum treatment, root canals, and extractions. Preventive care is also provided. Treatment is provided to both adults and children. Patients are selected via a lottery and there is often a waiting list.   [facility] [place] [address], [place]  [phone] [email or web address]   [facility] [address] [place], [place] [phone], Ext. [phone] Second and  Fourth Thursday of each month, opens at 6:30 AM; Clinic ends at 9 AM.  Patients are seen on a first-come first-served basis, and a limited number are seen during each clinic.   [facility]  [address] Hillard Danker [place], [place] [phone]   Eligibility Requirements You must have lived in [place], [place], or [place] counties for at least the last three months.   You cannot be eligible for state or federal sponsored [employer], including [employer], [place], or [employer].   You generally cannot be eligible for healthcare insurance through your employer.    How to apply: Eligibility screenings are held every Tuesday and Wednesday afternoon from 1:00 pm until 4:00 pm. You do not need an appointment for the interview!  [facility] [address] [place], [place]   Head of the Harbor  [place] Department  [place]  [phone]    Behavioral Health Resources in the Community: Intensive Outpatient Programs Organization         Address  Phone  Notes  [place]. [address] [place], [place] [phone]   [facility] [address], [place], [place]   ADS: Alcohol & Drug Svcs [address] [place], [place]   [place] [address],  [place], [place] or [phone]   Substance Abuse Resources Organization         Address  Phone  Notes  Alcohol and Drug Services  [phone]   [place]  [phone]   [facility] [place]   Chinita Pester  [phone]   Residential & Outpatient Substance Abuse Program  [phone]   Psychological Services Organization         Address  Phone  Notes  [facility] [place]  [place]  [phone]   [place]  [address], [place] or [phone]    Mobile Crisis Teams Organization         Address  Phone  Notes  [facility], Mobile Crisis Care Unit  [phone]   Assertive Psychotherapeutic Services  [address] [place], [place]   [place] Levels [address], [place] [phone]    Self-Help/Support Groups Organization         Address  Phone  Notes  Mental Health Assoc. of [place] - variety of support groups  [place] Call for more information  Narcotics Anonymous (NA), [facility] [address], [employer] [place]  2 meetings at this location   Special educational needs teacher         Address  Phone  Notes  ASAP Residential Treatment [place],    [place]  [phone]   [facility]  [address], [place] [address], [place], [place]   [place], [place] [phone] Admissions: 8am-3pm M-F  Incentives Substance [place] [address]    [place], [place] [phone]   [facility] [address] [place], [place], [place]   [facility] [address]  [place], [place]   Insight Programs - Intensive Outpatient [address] Kristeen [address], [place], [place]   [facility] ([place] [place].,  [place], [place] [phone] or [phone]   [facility] Services (RTS) [address] [place], [place] Accepts Medicaid  [facility] [place] [address],  [place] [phone] Substance Abuse/Addiction Treatment   [facility] Organization         Address  Phone  Notes  [facility]  [phone]   Domenic Schwab, PhD [address] Arlis [place], [place]   [phone] or [phone]   [place], [place] [phone]   [facility] [address], [place], [place] [phone] Insurance/Medicaid/sponsorship through [facility] and Families [address]                                    [place], [place] [phone] [place] [address], [place] [phone]    Dr. Adele Schilder  [phone]   [facility] Dept. 1) [address], [place] 2) [place] 3)  [place] [address], [place] [phone]  [phone]   [place] [phone] or [phone] (After Hours)

## 2014-03-04 NOTE — ED Notes (Signed)
Pt given turkey sandwich per PA approval.  

## 2014-03-04 NOTE — ED Notes (Signed)
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 Pt states hes been increasingly SOB with chest pain today. Pt becomes short of breath while speaking full sentences in triage

## 2014-03-04 NOTE — ED Notes (Signed)
Pt reports he has been out of his inhaler and COPD medications for four days.
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
## 2014-03-05 NOTE — Progress Notes (Signed)
03/05/14 1740 W. Stann Mainland RN BSN [phone] ED CM contacted patient concerning scheduling a f/u with the [facility]. Informed patient that I would send the [facility] an email regarding f/u. Checked schedule for Wednesday 11:15 with Dr. Erin Hearing. Pt made aware of appt date and time. He verbalizes understanding Teach back used. Provided patient with my contact information should any questions or concerns should arise. No further ED CM needs identified.   03/04/14 16:42 Wendi Maya RN BSN [phone] ED CM spoke with L.Parker PA-C  Regarding, Chest CT results concerning lung mass. At bedside patient tearful, offered self patient stated that he called his brother. Unable to speak at this time. I informed patient that I will call him tomorrow, after the start of my shift. Made patient aware that I would be in after 3p. Pt verbalized understanding . ED CM will f/u 4/24.  03/04/14 16:20 W. Stann Mainland RN BSN [phone] Late Entry: ED CM consulted to meet with patient regarding f/u care resources. Pt presented to [facility] ED with cough, chest tightness, wheezing. Hx of COPD. Met patient at bedside, confirmed information. Met with patient last month when he was visiting from [place], and needed medication assistance. Pt stated he is not gong back to CT. Discussed the importance and benefits of PCP and f/u care. Pt verbalizes understanding and agrees. Pt does not have health coverage and is currently unemployed. Discussed the [facility] for the uninsured. Pt is interested,offered to assist with scheduling follow up care. Pt appreciative for the assistance. ED work up in progress, Neb tx, Chest X-ray done. Awaiting  Chest CT results.

## 2014-03-07 NOTE — ED Provider Notes (Signed)
Medical screening examination/treatment/procedure(s) were performed by non-physician practitioner and as supervising physician I was immediately available for consultation/collaboration.   EKG Interpretation   Date/Time:  Thursday March 04 2014 14:55:27 EDT Ventricular Rate:  95 PR Interval:  126 QRS Duration: 78 QT Interval:  340 QTC Calculation: 427 R Axis:   58 Text Interpretation:  Normal sinus rhythm Biatrial enlargement Left  ventricular hypertrophy Abnormal ECG No significant change since last  tracing Confirmed by YAO  MD, DAVID ([id number]) on 03/04/2014 8:54:02 PM        Wandra Arthurs, MD 03/07/14 [phone]

## 2014-03-15 ENCOUNTER — Ambulatory Visit: Payer: Medicaid Other | Attending: Internal Medicine | Admitting: Internal Medicine

## 2014-03-15 ENCOUNTER — Encounter: Payer: Self-pay | Admitting: Internal Medicine

## 2014-03-15 VITALS — BP 118/79 | HR 84 | Temp 98.2°F | Resp 16 | Ht 76.0 in | Wt 155.0 lb

## 2014-03-15 DIAGNOSIS — R634 Abnormal weight loss: Secondary | ICD-10-CM | POA: Insufficient documentation

## 2014-03-15 DIAGNOSIS — Z87891 Personal history of nicotine dependence: Secondary | ICD-10-CM | POA: Insufficient documentation

## 2014-03-15 DIAGNOSIS — R222 Localized swelling, mass and lump, trunk: Secondary | ICD-10-CM | POA: Insufficient documentation

## 2014-03-15 DIAGNOSIS — J4489 Other specified chronic obstructive pulmonary disease: Secondary | ICD-10-CM | POA: Insufficient documentation

## 2014-03-15 DIAGNOSIS — J449 Chronic obstructive pulmonary disease, unspecified: Secondary | ICD-10-CM

## 2014-03-15 DIAGNOSIS — R918 Other nonspecific abnormal finding of lung field: Secondary | ICD-10-CM | POA: Insufficient documentation

## 2014-03-15 MED ORDER — ALBUTEROL SULFATE HFA 108 (90 BASE) MCG/ACT IN AERS: 1.0000 | INHALATION_SPRAY | Freq: Four times a day (QID) | RESPIRATORY_TRACT | Status: DC | PRN

## 2014-03-15 MED ORDER — ALBUTEROL SULFATE (2.5 MG/3ML) 0.083% IN NEBU: 2.5000 mg | INHALATION_SOLUTION | Freq: Four times a day (QID) | RESPIRATORY_TRACT | Status: DC | PRN

## 2014-03-15 MED ORDER — MOMETASONE FURO-FORMOTEROL FUM 100-5 MCG/ACT IN AERO: 2.0000 | INHALATION_SPRAY | Freq: Two times a day (BID) | RESPIRATORY_TRACT | Status: DC
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
## 2014-03-15 NOTE — Patient Instructions (Signed)
Chronic Obstructive Pulmonary Disease Chronic obstructive pulmonary disease (COPD) is a common lung condition in which airflow from the lungs is limited. COPD is a general term that can be used to describe many different lung problems that limit airflow, including both chronic bronchitis and emphysema. If you have COPD, your lung function will probably never return to normal, but there are measures you can take to improve lung function and make yourself feel better.  CAUSES   Smoking (common).   Exposure to secondhand smoke.   Genetic problems.  Chronic inflammatory lung diseases or recurrent infections. SYMPTOMS   Shortness of breath, especially with physical activity.   Deep, persistent (chronic) cough with a large amount of thick mucus.   Wheezing.   Rapid breaths (tachypnea).   Gray or bluish discoloration (cyanosis) of the skin, especially in fingers, toes, or lips.   Fatigue.   Weight loss.   Frequent infections or episodes when breathing symptoms become much worse (exacerbations).   Chest tightness. DIAGNOSIS  Your healthcare provider will take a medical history and perform a physical examination to make the initial diagnosis. Additional tests for COPD may include:   Lung (pulmonary) function tests.  Chest X-ray.  CT scan.  Blood tests. TREATMENT  Treatment available to help you feel better when you have COPD include:   Inhaler and nebulizer medicines. These help manage the symptoms of COPD and make your breathing more comfortable  Supplemental oxygen. Supplemental oxygen is only helpful if you have a low oxygen level in your blood.   Exercise and physical activity. These are beneficial for nearly all people with COPD. Some people may also benefit from a pulmonary rehabilitation program. HOME CARE INSTRUCTIONS   Take all medicines (inhaled or pills) as directed by your health care provider.  Only take over-the-counter or prescription medicines  for pain, fever, or discomfort as directed by your health care provider.   Avoid over-the-counter medicines or cough syrups that dry up your airway (such as antihistamines) and slow down the elimination of secretions unless instructed otherwise by your healthcare provider.   If you are a smoker, the most important thing that you can do is stop smoking. Continuing to smoke will cause further lung damage and breathing trouble. Ask your health care provider for help with quitting smoking. He or she can direct you to community resources or hospitals that provide support.  Avoid exposure to irritants such as smoke, chemicals, and fumes that aggravate your breathing.  Use oxygen therapy and pulmonary rehabilitation if directed by your health care provider. If you require home oxygen therapy, ask your healthcare provider whether you should purchase a pulse oximeter to measure your oxygen level at home.   Avoid contact with individuals who have a contagious illness.  Avoid extreme temperature and humidity changes.  Eat healthy foods. Eating smaller, more frequent meals and resting before meals may help you maintain your strength.  Stay active, but balance activity with periods of rest. Exercise and physical activity will help you maintain your ability to do things you want to do.  Preventing infection and hospitalization is very important when you have COPD. Make sure to receive all the vaccines your health care provider recommends, especially the pneumococcal and influenza vaccines. Ask your healthcare provider whether you need a pneumonia vaccine.  Learn and use relaxation techniques to manage stress.  Learn and use controlled breathing techniques as directed by your health care provider. Controlled breathing techniques include:   Pursed lip breathing. Start by breathing   in (inhaling) through your nose for 1 second. Then, purse your lips as if you were going to whistle and breathe out (exhale)  through the pursed lips for 2 seconds.   Diaphragmatic breathing. Start by putting one hand on your abdomen just above your waist. Inhale slowly through your nose. The hand on your abdomen should move out. Then purse your lips and exhale slowly. You should be able to feel the hand on your abdomen moving in as you exhale.   Learn and use controlled coughing to clear mucus from your lungs. Controlled coughing is a series of short, progressive coughs. The steps of controlled coughing are:  1. Lean your head slightly forward.  2. Breathe in deeply using diaphragmatic breathing.  3. Try to hold your breath for 3 seconds.  4. Keep your mouth slightly open while coughing twice.  5. Spit any mucus out into a tissue.  6. Rest and repeat the steps once or twice as needed. SEEK MEDICAL CARE IF:   You are coughing up more mucus than usual.   There is a change in the color or thickness of your mucus.   Your breathing is more labored than usual.   Your breathing is faster than usual.  SEEK IMMEDIATE MEDICAL CARE IF:   You have shortness of breath while you are resting.   You have shortness of breath that prevents you from:  Being able to talk.   Performing your usual physical activities.   You have chest pain lasting longer than 5 minutes.   Your skin color is more cyanotic than usual.  You measure low oxygen saturations for longer than 5 minutes with a pulse oximeter. MAKE SURE YOU:   Understand these instructions.  Will watch your condition.  Will get help right away if you are not doing well or get worse. Document Released: 08/08/2005 Document Revised: 08/19/2013 Document Reviewed: 06/25/2013 [facility] Patient Information 2014 [place], [place]. Pulmonary Nodule A pulmonary nodule is a small, round growth of tissue in the lung. Pulmonary nodules can range in size from less than 1/5 inch (4 mm) to a little bigger than an inch (25 mm). Most pulmonary nodules are detected  when imaging tests of the lung are being performed for a different problem. Pulmonary nodules are usually not cancerous (benign). However, some pulmonary nodules are cancerous (malignant). Follow-up treatment or testing is based on the size of the pulmonary nodule and your risk of getting lung cancer.  CAUSES Benign pulmonary nodules can be caused by various things. Some of the causes include:   Bacterial, fungal, or viral infections. This is usually an old infection that is no longer active, but it can sometimes be a current, active infection.  A benign mass of tissue.  Inflammation from conditions such as rheumatoid arthritis.   Abnormal blood vessels in the lungs. Malignant pulmonary nodules can result from lung cancer or from cancers that spread to the lung from other places in the body. SIGNS AND SYMPTOMS Pulmonary nodules usually do not cause symptoms. DIAGNOSIS Most often, pulmonary nodules are found incidentally when an X-ray or CT scan is performed to look for some other problem in the lung area. To help determine whether a pulmonary nodule is benign or malignant, your health care provider will take a medical history and order a variety of tests. Tests done may include:   Blood tests.  A skin test called a tuberculin test. This test is used to determine if you have been exposed to the germ  that causes tuberculosis.   Chest X-rays. If possible, a new X-ray may be compared with X-rays you have had in the past.   CT scan. This test shows smaller pulmonary nodules more clearly than an X-ray.   Positron emission tomography (PET) scan. In this test, a safe amount of a radioactive substance is injected into the bloodstream. Then, the scan takes a picture of the pulmonary nodule. The radioactive substance is eliminated from your body in your urine.   Biopsy. A tiny piece of the pulmonary nodule is removed so it can be checked under a microscope. TREATMENT  Pulmonary nodules that  are benign normally do not require any treatment because they usually do not cause symptoms or breathing problems. Your health care provider may want to monitor the pulmonary nodule through follow-up CT scans. The frequency of these CT scans will vary based on the size of the nodule and the risk factors for lung cancer. For example, CT scans will need to be done more frequently if the pulmonary nodule is larger and if you have a history of smoking and a family history of cancer. Further testing or biopsies may be done if any follow-up CT scan shows that the size of the pulmonary nodule has increased. HOME CARE INSTRUCTIONS  Only take over-the-counter or prescription medicines as directed by your health care provider.  Keep all follow-up appointments with your health care provider. SEEK MEDICAL CARE IF:  You have trouble breathing when you are active.   You feel sick or unusually tired.   You do not feel like eating.   You lose weight without trying to.   You develop chills or night sweats.  SEEK IMMEDIATE MEDICAL CARE IF:  You cannot catch your breath, or you begin wheezing.   You cannot stop coughing.   You cough up blood.   You become dizzy or feel like you are going to pass out.   You have sudden chest pain.   You have a fever or persistent symptoms for more than 2 3 days.   You have a fever and your symptoms suddenly get worse. MAKE SURE YOU:  Understand these instructions.  Will watch your condition.  Will get help right away if you are not doing well or get worse. Document Released: 08/26/2009 Document Revised: 07/01/2013 Document Reviewed: 04/20/2013 [facility] Patient Information 2014 [place].

## 2014-03-22 ENCOUNTER — Ambulatory Visit: Payer: Self-pay | Attending: Internal Medicine

## 2014-03-24 ENCOUNTER — Other Ambulatory Visit: Payer: Self-pay | Admitting: Internal Medicine

## 2014-03-24 DIAGNOSIS — J449 Chronic obstructive pulmonary disease, unspecified: Secondary | ICD-10-CM

## 2014-03-24 MED ORDER — ALBUTEROL SULFATE HFA 108 (90 BASE) MCG/ACT IN AERS: 1.0000 | INHALATION_SPRAY | Freq: Four times a day (QID) | RESPIRATORY_TRACT | Status: DC | PRN

## 2014-03-24 MED ORDER — MOMETASONE FURO-FORMOTEROL FUM 100-5 MCG/ACT IN AERO: 2.0000 | INHALATION_SPRAY | Freq: Two times a day (BID) | RESPIRATORY_TRACT | Status: DC

## 2014-03-26 ENCOUNTER — Encounter: Payer: Self-pay | Admitting: Pulmonary Disease

## 2014-03-26 ENCOUNTER — Ambulatory Visit (INDEPENDENT_AMBULATORY_CARE_PROVIDER_SITE_OTHER): Payer: Medicaid Other | Admitting: Pulmonary Disease

## 2014-03-26 VITALS — BP 128/88 | HR 73 | Temp 98.4°F | Ht 74.0 in | Wt 160.0 lb

## 2014-03-26 DIAGNOSIS — R222 Localized swelling, mass and lump, trunk: Secondary | ICD-10-CM

## 2014-03-26 DIAGNOSIS — R918 Other nonspecific abnormal finding of lung field: Secondary | ICD-10-CM
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
## 2014-03-26 NOTE — Patient Instructions (Signed)
Will get the PET scan, and call you once I get the results.  Make sure they send me a copy of the report at the time you check in.

## 2014-04-01 ENCOUNTER — Encounter (HOSPITAL_COMMUNITY): Payer: Self-pay

## 2014-04-01 ENCOUNTER — Ambulatory Visit (HOSPITAL_COMMUNITY)
Admission: RE | Admit: 2014-04-01 | Discharge: 2014-04-01 | Disposition: A | Discharge status: Home / Self Care | Payer: Medicaid Other | Source: Ambulatory Visit | Attending: Internal Medicine | Admitting: Internal Medicine

## 2014-04-01 DIAGNOSIS — M24019 Loose body in unspecified shoulder: Secondary | ICD-10-CM | POA: Insufficient documentation

## 2014-04-01 DIAGNOSIS — R918 Other nonspecific abnormal finding of lung field: Secondary | ICD-10-CM

## 2014-04-01 DIAGNOSIS — K7689 Other specified diseases of liver: Secondary | ICD-10-CM | POA: Insufficient documentation

## 2014-04-01 DIAGNOSIS — M25419 Effusion, unspecified shoulder: Secondary | ICD-10-CM | POA: Insufficient documentation

## 2014-04-01 DIAGNOSIS — I700 Atherosclerosis of aorta: Secondary | ICD-10-CM | POA: Insufficient documentation

## 2014-04-01 DIAGNOSIS — R222 Localized swelling, mass and lump, trunk: Secondary | ICD-10-CM | POA: Insufficient documentation

## 2014-04-01 DIAGNOSIS — Z902 Acquired absence of lung [part of]: Secondary | ICD-10-CM | POA: Insufficient documentation

## 2014-04-01 LAB — GLUCOSE, CAPILLARY: Glucose-Capillary: 96 mg/dL (ref 70–99)
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
## 2014-04-01 MED ORDER — FLUDEOXYGLUCOSE F - 18 (FDG) INJECTION
8.7000 | Freq: Once | INTRAVENOUS | Status: AC | PRN
  Administered 2014-04-01: 8.7 via INTRAVENOUS

## 2014-04-07 ENCOUNTER — Telehealth: Payer: Self-pay | Admitting: Emergency Medicine
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
## 2014-04-07 NOTE — Telephone Encounter (Signed)
Left message for pt to call for results  

## 2014-04-08 ENCOUNTER — Telehealth: Payer: Self-pay | Admitting: Emergency Medicine

## 2014-04-08 NOTE — Telephone Encounter (Signed)
Left message for pt to call when message received 

## 2014-04-09 ENCOUNTER — Ambulatory Visit: Payer: Self-pay | Attending: Internal Medicine
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
## 2014-06-15 ENCOUNTER — Ambulatory Visit: Payer: Medicaid Other | Admitting: Internal Medicine

## 2014-06-27 ENCOUNTER — Emergency Department (HOSPITAL_COMMUNITY): Payer: Self-pay

## 2014-06-27 ENCOUNTER — Encounter (HOSPITAL_COMMUNITY): Payer: Self-pay | Admitting: Emergency Medicine

## 2014-06-27 ENCOUNTER — Emergency Department (HOSPITAL_COMMUNITY)
Admission: EM | Admit: 2014-06-27 | Discharge: 2014-06-27 | Disposition: A | Discharge status: Home / Self Care | Payer: Self-pay | Attending: Emergency Medicine | Admitting: Emergency Medicine

## 2014-06-27 DIAGNOSIS — Z79899 Other long term (current) drug therapy: Secondary | ICD-10-CM | POA: Insufficient documentation

## 2014-06-27 DIAGNOSIS — J441 Chronic obstructive pulmonary disease with (acute) exacerbation: Secondary | ICD-10-CM | POA: Insufficient documentation

## 2014-06-27 DIAGNOSIS — R61 Generalized hyperhidrosis: Secondary | ICD-10-CM | POA: Insufficient documentation

## 2014-06-27 DIAGNOSIS — Z87828 Personal history of other (healed) physical injury and trauma: Secondary | ICD-10-CM | POA: Insufficient documentation

## 2014-06-27 DIAGNOSIS — F1110 Opioid abuse, uncomplicated: Secondary | ICD-10-CM | POA: Insufficient documentation

## 2014-06-27 DIAGNOSIS — R079 Chest pain, unspecified: Secondary | ICD-10-CM | POA: Insufficient documentation

## 2014-06-27 DIAGNOSIS — F1210 Cannabis abuse, uncomplicated: Secondary | ICD-10-CM | POA: Insufficient documentation

## 2014-06-27 DIAGNOSIS — R569 Unspecified convulsions: Secondary | ICD-10-CM | POA: Insufficient documentation

## 2014-06-27 DIAGNOSIS — G8929 Other chronic pain: Secondary | ICD-10-CM | POA: Insufficient documentation

## 2014-06-27 DIAGNOSIS — M545 Low back pain, unspecified: Secondary | ICD-10-CM | POA: Insufficient documentation

## 2014-06-27 DIAGNOSIS — Z87891 Personal history of nicotine dependence: Secondary | ICD-10-CM | POA: Insufficient documentation

## 2014-06-27 DIAGNOSIS — R112 Nausea with vomiting, unspecified: Secondary | ICD-10-CM | POA: Insufficient documentation

## 2014-06-27 LAB — CBC
HCT: 41 % (ref 39.0–52.0)
Hemoglobin: 14 g/dL (ref 13.0–17.0)
MCH: 31.2 pg (ref 26.0–34.0)
MCHC: 34.1 g/dL (ref 30.0–36.0)
MCV: 91.3 fL (ref 78.0–100.0)
PLATELETS: 148 10*3/uL — AB (ref 150–400)
RBC: 4.49 MIL/uL (ref 4.22–5.81)
RDW: 12.7 % (ref 11.5–15.5)
WBC: 7.1 10*3/uL (ref 4.0–10.5)

## 2014-06-27 LAB — RAPID URINE DRUG SCREEN, HOSP PERFORMED
AMPHETAMINES: NOT DETECTED
BENZODIAZEPINES: NOT DETECTED
Barbiturates: NOT DETECTED
Cocaine: NOT DETECTED
Opiates: POSITIVE — AB
Tetrahydrocannabinol: POSITIVE — AB

## 2014-06-27 LAB — URINALYSIS, ROUTINE W REFLEX MICROSCOPIC
BILIRUBIN URINE: NEGATIVE
Glucose, UA: NEGATIVE mg/dL
Hgb urine dipstick: NEGATIVE
Ketones, ur: NEGATIVE mg/dL
LEUKOCYTES UA: NEGATIVE
Nitrite: NEGATIVE
PH: 6.5 (ref 5.0–8.0)
Protein, ur: NEGATIVE mg/dL
Specific Gravity, Urine: 1.025 (ref 1.005–1.030)
Urobilinogen, UA: 0.2 mg/dL (ref 0.0–1.0)

## 2014-06-27 LAB — BASIC METABOLIC PANEL
ANION GAP: 12 (ref 5–15)
BUN: 7 mg/dL (ref 6–23)
CALCIUM: 8.8 mg/dL (ref 8.4–10.5)
CO2: 24 mEq/L (ref 19–32)
CREATININE: 0.96 mg/dL (ref 0.50–1.35)
Chloride: 104 mEq/L (ref 96–112)
GFR calc non Af Amer: >90 mL/min (ref >90)
Glucose, Bld: 97 mg/dL (ref 70–99)
Potassium: 4.1 mEq/L (ref 3.7–5.3)
Sodium: 140 mEq/L (ref 137–147)

## 2014-06-27 LAB — I-STAT TROPONIN, ED: Troponin i, poc: 0.01 ng/mL (ref 0.00–0.08)

## 2014-06-27 LAB — LIPASE, BLOOD: Lipase: 19 U/L (ref 11–59)

## 2014-06-27 IMAGING — CR DG CHEST 2V
3 series · 3 of 3 positions shown · non-contrast
Comparison: [DATE]

CLINICAL DATA: Chest pain.

EXAM:
CHEST  2 VIEW

[w chest pa (1 of 2)]
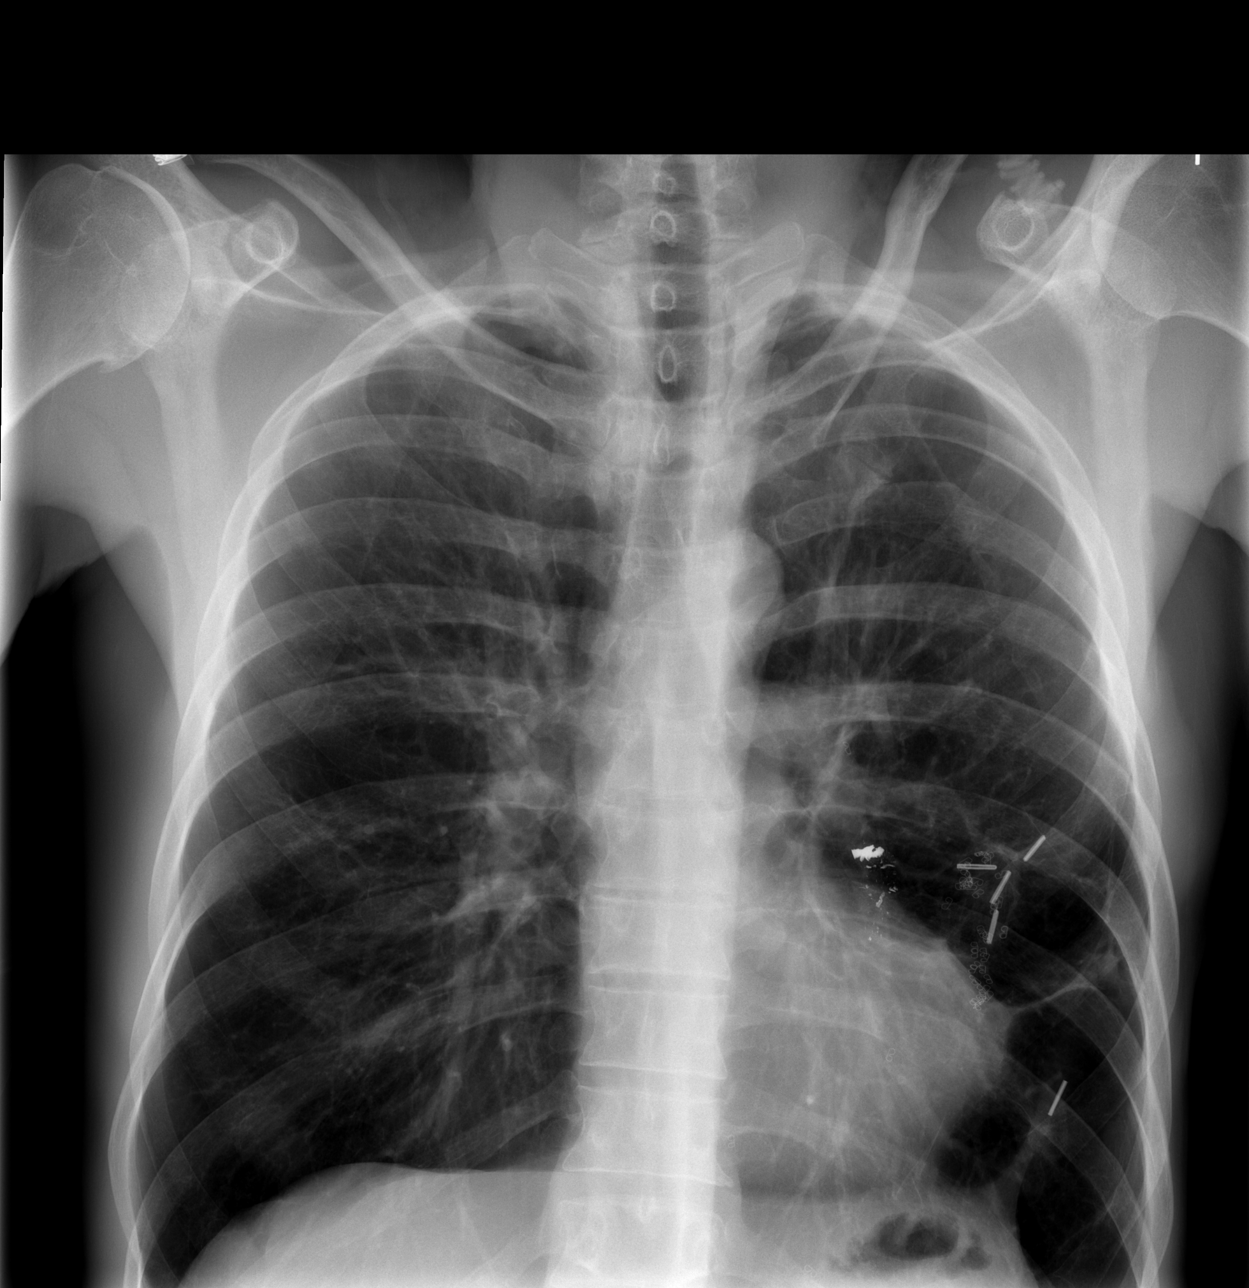

[w chest pa (2 of 2)]
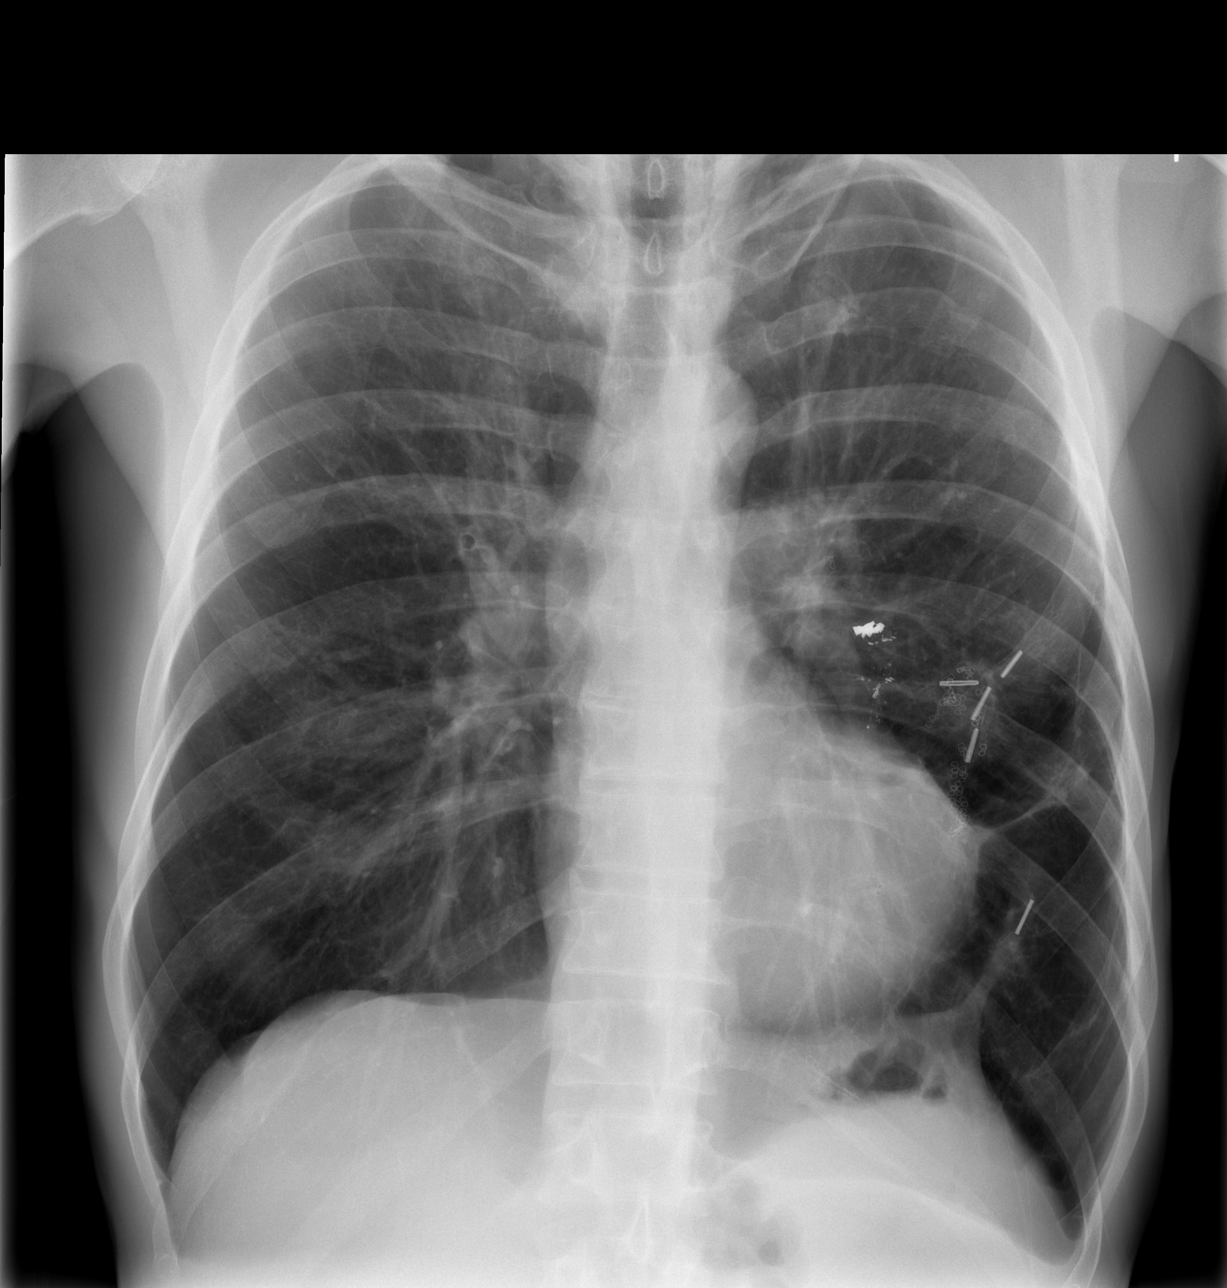

[w chest lat]
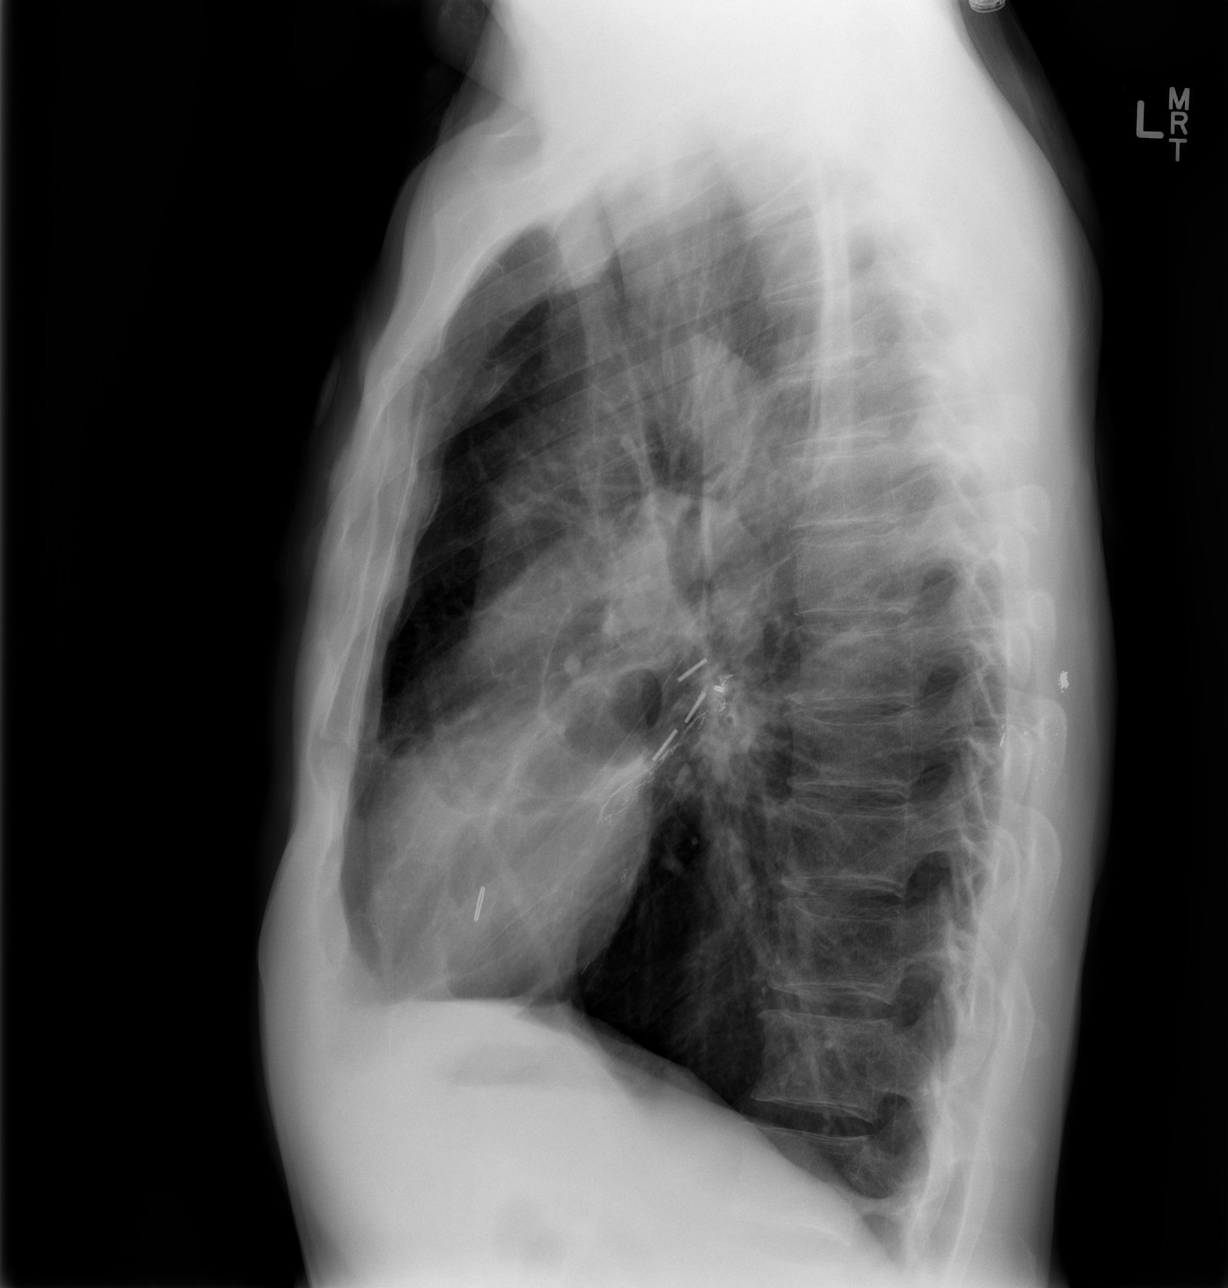

[3 of 3 positions shown; findings below may reference images not displayed]

FINDINGS: The cardiac silhouette, mediastinal and hilar contours are stable.
Stable surgical changes involving the left hemi thorax. Stable
emphysematous changes. No acute overlying pulmonary process. No
pleural effusion. The bony thorax is intact.
IMPRESSION: Chronic lung changes and chronic surgical changes. No acute
pulmonary findings.

## 2014-06-27 IMAGING — CT CT HEAD W/O CM
2 series · 16 of 30 positions shown, 20 images · non-contrast
Comparison: [DATE].

CLINICAL DATA: Seizure-like episode.

EXAM:
CT HEAD WITHOUT CONTRAST
TECHNIQUE: Contiguous axial images were obtained from the base of the skull
through the vertex without intravenous contrast.

[Series 201: head w/o, idose (1) · axial · non-contrast · 0.49mm/px · z∈[+84,+214]mm · 13 of 32 slices shown, 17 images]
[im 3/32  brain]
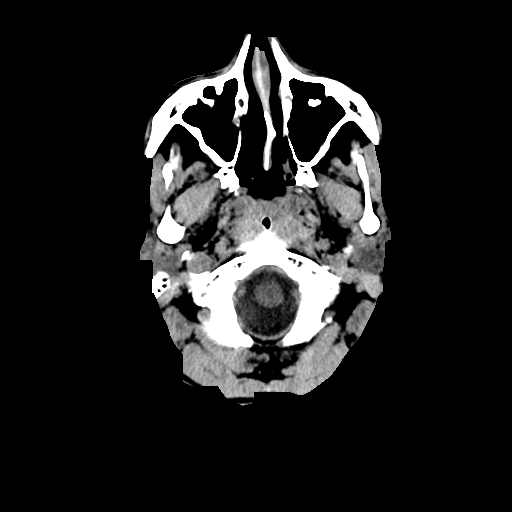
[im 3/32  bone]
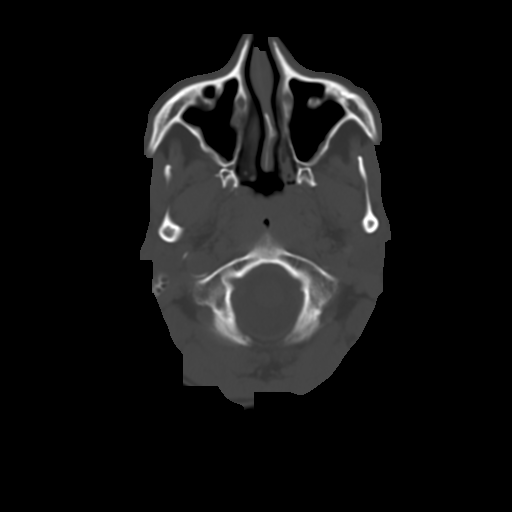
[im 5/32  brain]
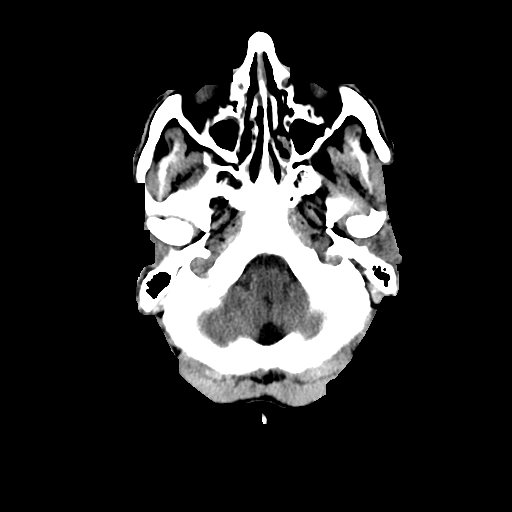
[im 7/32  brain]
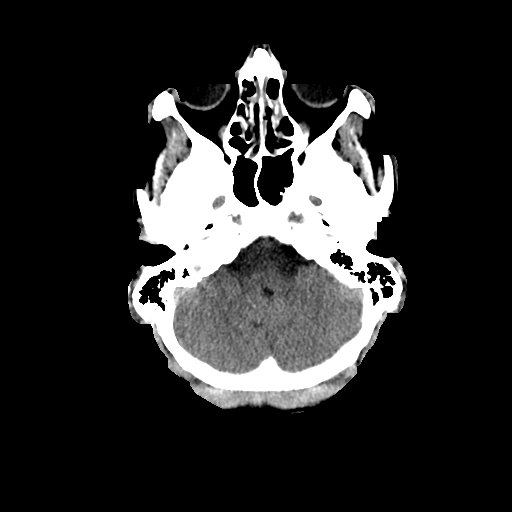
[im 9/32  brain]
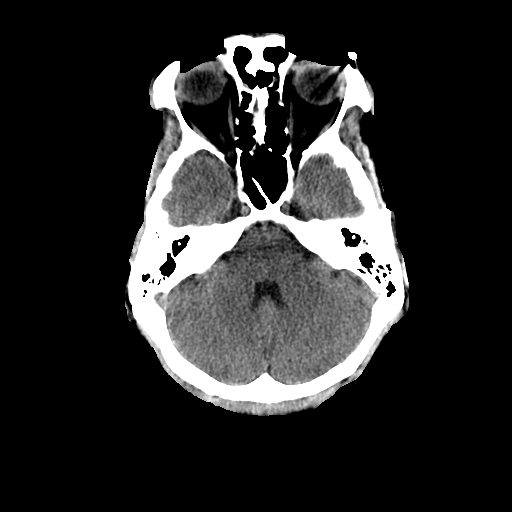
[im 12/32  brain]
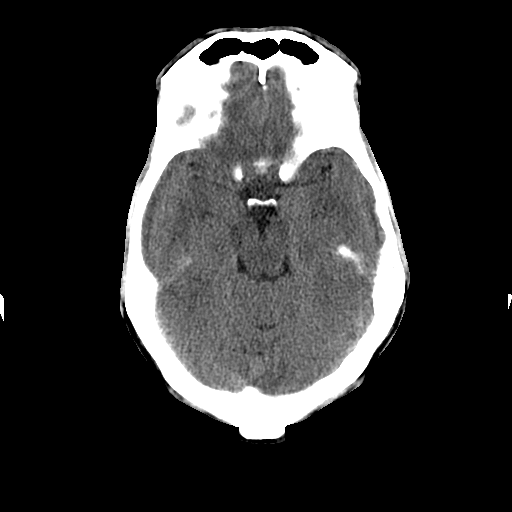
[im 12/32  bone]
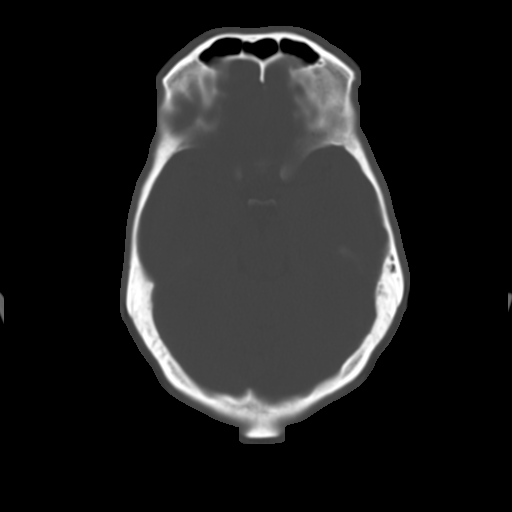
[im 14/32  brain]
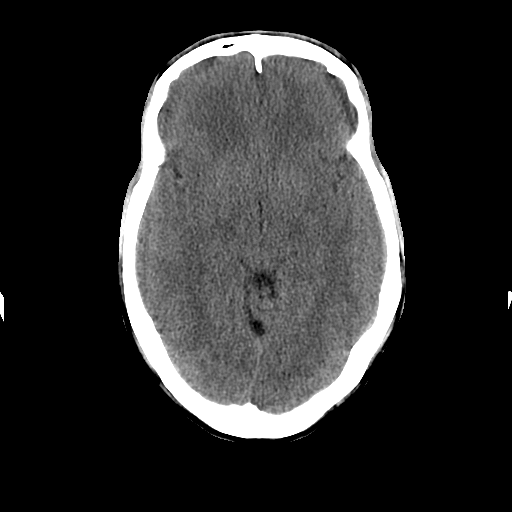
[im 16/32  brain]
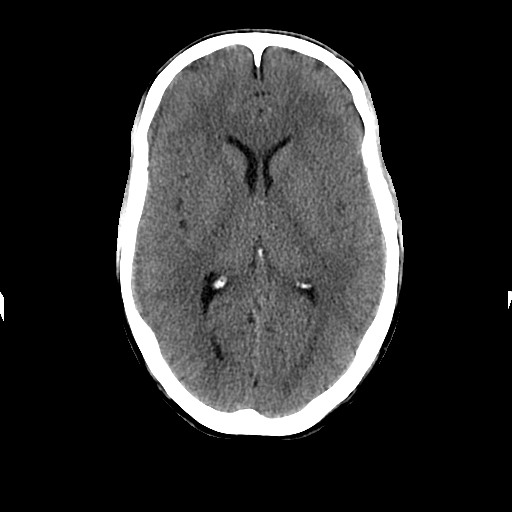
[im 18/32  brain]
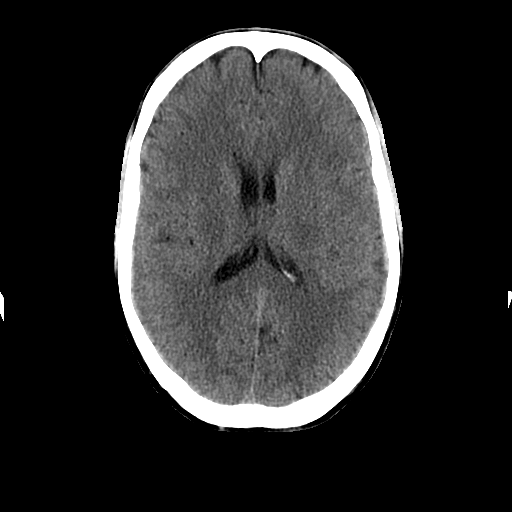
[im 20/32  brain]
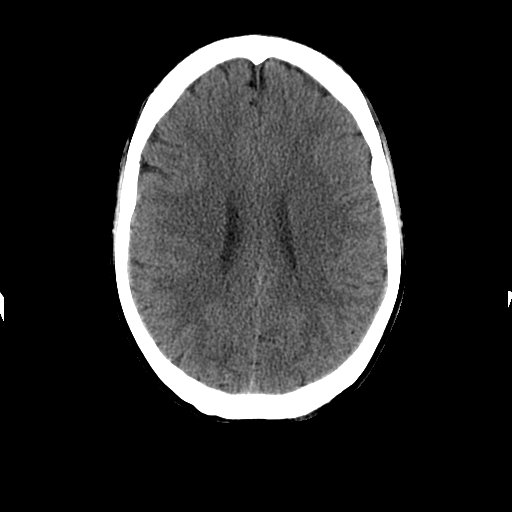
[im 20/32  bone]
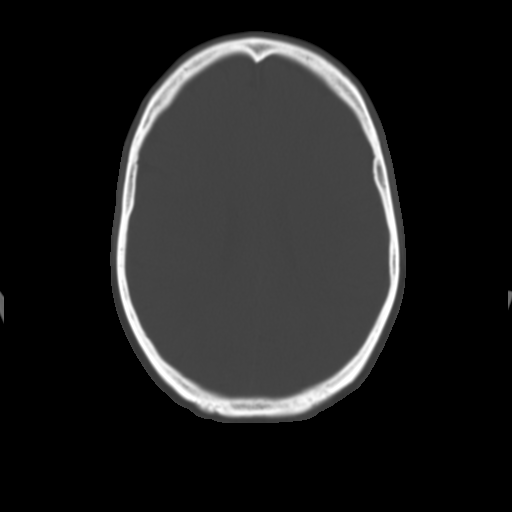
[im 23/32  brain]
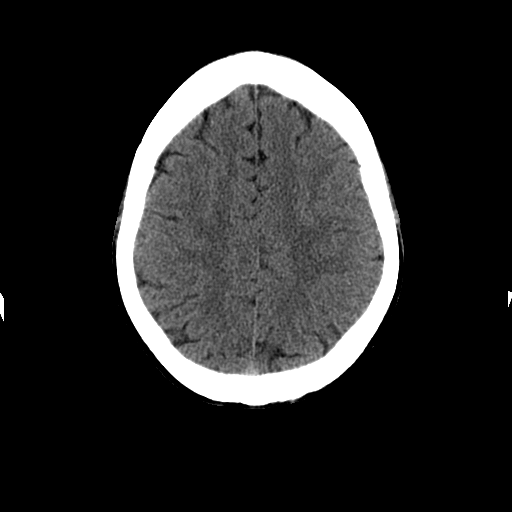
[im 25/32  brain]
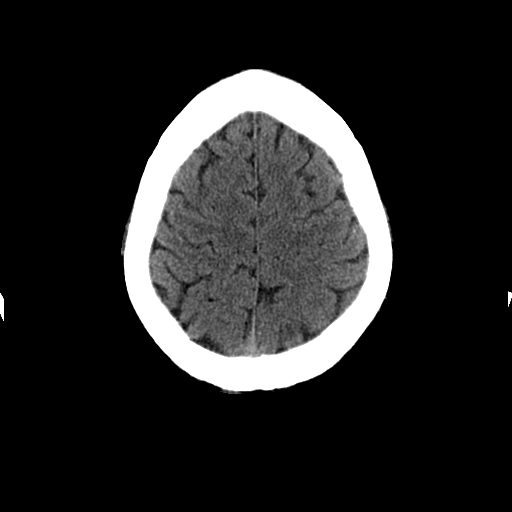
[im 27/32  brain]
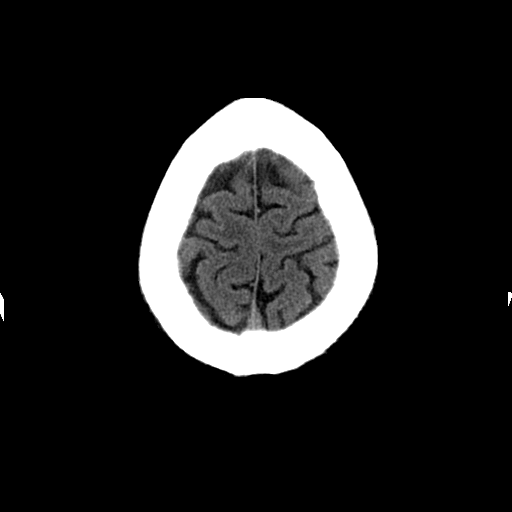
[im 29/32  brain]
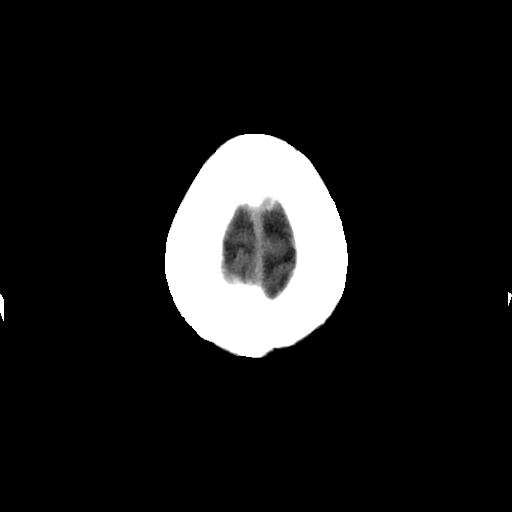
[im 29/32  bone]
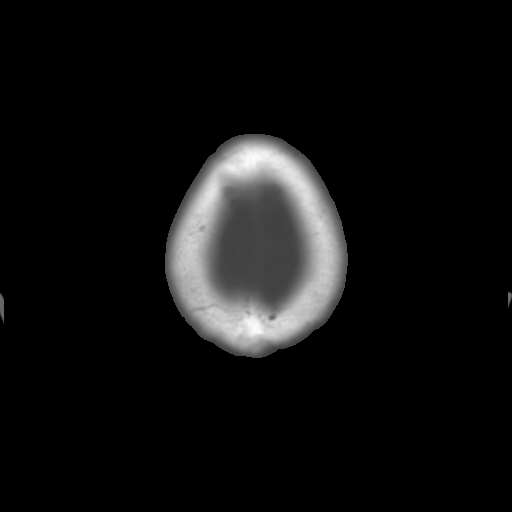

[Series 202: head w/o bone, idose (1) · axial · non-contrast · 0.49mm/px · z∈[+84,+129]mm · 3 of 32 slices shown]
[im 3/32  bone]
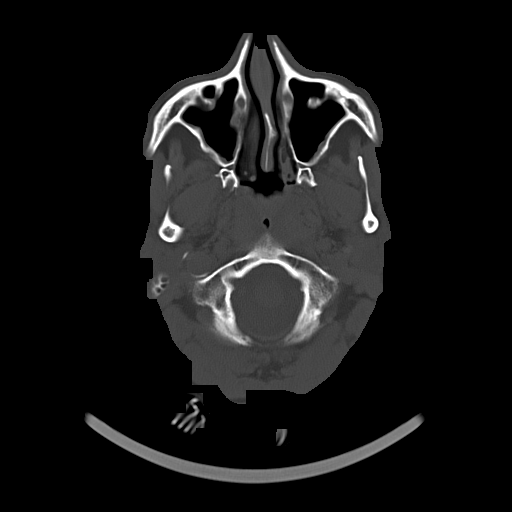
[im 7/32  bone]
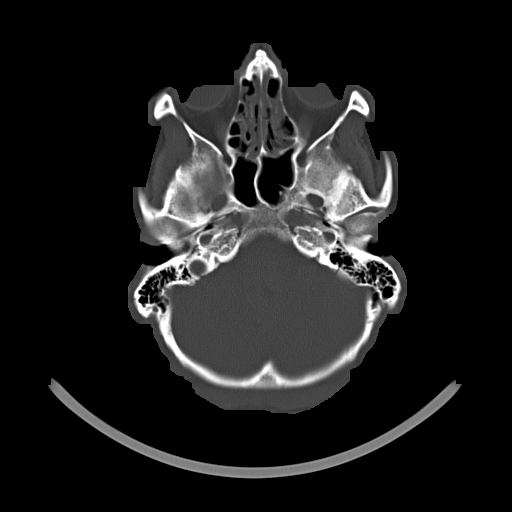
[im 12/32  bone]
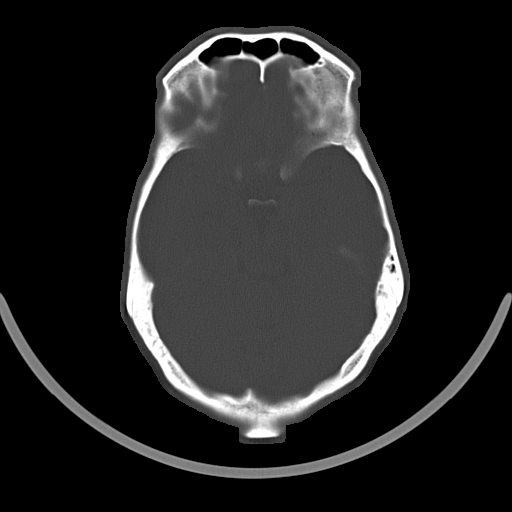

[16 of 30 positions shown; findings below may reference images not displayed]

FINDINGS: Skull and Sinuses:Negative for fracture or destructive process.
Inflammatory mucosal thickening throughout the ethmoid and maxillary
sinuses. No acute sinus effusion. Clear mastoid and middle ear
cavities.

Orbits: No acute abnormality.

Brain: No evidence of acute abnormality, such as acute infarction,
hemorrhage, hydrocephalus, or mass lesion/mass effect.
IMPRESSION: No acute intracranial abnormality.

## 2014-06-27 MED ORDER — ONDANSETRON HCL 4 MG/2ML IJ SOLN
4.0000 mg | Freq: Once | INTRAMUSCULAR | Status: AC
  Administered 2014-06-27: 4 mg via INTRAVENOUS
  Filled 2014-06-27: qty 2

## 2014-06-27 MED ORDER — PREDNISONE 20 MG PO TABS: 40.0000 mg | ORAL_TABLET | Freq: Every day | ORAL | Status: AC

## 2014-06-27 MED ORDER — SODIUM CHLORIDE 0.9 % IV BOLUS (SEPSIS)
1000.0000 mL | Freq: Once | INTRAVENOUS | Status: AC
  Administered 2014-06-27: 1000 mL via INTRAVENOUS

## 2014-06-27 MED ORDER — IPRATROPIUM-ALBUTEROL 0.5-2.5 (3) MG/3ML IN SOLN
3.0000 mL | Freq: Once | RESPIRATORY_TRACT | Status: AC
  Administered 2014-06-27: 3 mL via RESPIRATORY_TRACT

## 2014-06-27 MED ORDER — IPRATROPIUM-ALBUTEROL 0.5-2.5 (3) MG/3ML IN SOLN: 3.0000 mL | RESPIRATORY_TRACT | Status: AC

## 2014-06-27 MED ORDER — METHYLPREDNISOLONE SODIUM SUCC 125 MG IJ SOLR
125.0000 mg | Freq: Once | INTRAMUSCULAR | Status: AC
  Administered 2014-06-27: 125 mg via INTRAVENOUS
  Filled 2014-06-27: qty 2

## 2014-06-27 MED ORDER — ALBUTEROL SULFATE HFA 108 (90 BASE) MCG/ACT IN AERS: 1.0000 | INHALATION_SPRAY | Freq: Four times a day (QID) | RESPIRATORY_TRACT | Status: DC | PRN

## 2014-06-27 MED ORDER — MOMETASONE FURO-FORMOTEROL FUM 100-5 MCG/ACT IN AERO: 1.0000 | INHALATION_SPRAY | Freq: Two times a day (BID) | RESPIRATORY_TRACT | Status: DC

## 2014-06-27 MED ORDER — MORPHINE SULFATE 4 MG/ML IJ SOLN: 4.0000 mg | Freq: Once | INTRAMUSCULAR | Status: DC

## 2014-06-27 MED ORDER — IPRATROPIUM-ALBUTEROL 0.5-2.5 (3) MG/3ML IN SOLN
5.0000 mL | Freq: Once | RESPIRATORY_TRACT | Status: DC
Start: 2014-06-27 — End: 2014-06-27
  Filled 2014-06-27: qty 3

## 2014-06-27 MED ORDER — MORPHINE SULFATE 4 MG/ML IJ SOLN
8.0000 mg | Freq: Once | INTRAMUSCULAR | Status: AC
  Administered 2014-06-27: 8 mg via INTRAVENOUS
  Filled 2014-06-27: qty 2

## 2014-06-27 NOTE — ED Notes (Signed)
Attempted to draw blood on pt.  Pt became distressed, shaking, heavy breathing, and shedding tears.  Mother states the behavior is unusual.  EDP's stated it was ok to delay labs

## 2014-06-27 NOTE — ED Notes (Signed)
Phlebotomy at bedside.

## 2014-06-27 NOTE — ED Notes (Addendum)
Presents with SOB, nausea, vomiting and generalized chest pain. Unable to hold food down for the last 24 hours. COPD pt with diminished breath sounds Sats 94% RA, out of COPD medication for the past 3 days. endorses chills. Pt is diaphoretic-restless and vomiting. Able to speak in full sentences. Denies diarrhea, also c/o lower back pain.

## 2014-06-27 NOTE — ED Notes (Signed)
Dr. Smith at bedside.

## 2014-06-27 NOTE — Discharge Instructions (Signed)
Chronic Obstructive Pulmonary Disease  Chronic obstructive pulmonary disease (COPD) is a common lung condition in which airflow from the lungs is limited. COPD is a general term that can be used to describe many different lung problems that limit airflow, including both chronic bronchitis and emphysema. If you have COPD, your lung function will probably never return to normal, but there are measures you can take to improve lung function and make yourself feel better.   CAUSES    Smoking (common).    Exposure to secondhand smoke.    Genetic problems.   Chronic inflammatory lung diseases or recurrent infections.  SYMPTOMS    Shortness of breath, especially with physical activity.    Deep, persistent (chronic) cough with a large amount of thick mucus.    Wheezing.    Rapid breaths (tachypnea).    Gray or bluish discoloration (cyanosis) of the skin, especially in fingers, toes, or lips.    Fatigue.    Weight loss.    Frequent infections or episodes when breathing symptoms become much worse (exacerbations).    Chest tightness.  DIAGNOSIS   Your health care provider will take a medical history and perform a physical examination to make the initial diagnosis. Additional tests for COPD may include:    Lung (pulmonary) function tests.   Chest X-ray.   CT scan.   Blood tests.  TREATMENT   Treatment available to help you feel better when you have COPD includes:    Inhaler and nebulizer medicines. These help manage the symptoms of COPD and make your breathing more comfortable.   Supplemental oxygen. Supplemental oxygen is only helpful if you have a low oxygen level in your blood.    Exercise and physical activity. These are beneficial for nearly all people with COPD. Some people may also benefit from a pulmonary rehabilitation program.  HOME CARE INSTRUCTIONS    Take all medicines (inhaled or pills) as directed by your health care provider.   Avoid over-the-counter medicines or cough syrups  that dry up your airway (such as antihistamines) and slow down the elimination of secretions unless instructed otherwise by your health care provider.    If you are a smoker, the most important thing that you can do is stop smoking. Continuing to smoke will cause further lung damage and breathing trouble. Ask your health care provider for help with quitting smoking. He or she can direct you to community resources or hospitals that provide support.   Avoid exposure to irritants such as smoke, chemicals, and fumes that aggravate your breathing.   Use oxygen therapy and pulmonary rehabilitation if directed by your health care provider. If you require home oxygen therapy, ask your health care provider whether you should purchase a pulse oximeter to measure your oxygen level at home.    Avoid contact with individuals who have a contagious illness.   Avoid extreme temperature and humidity changes.   Eat healthy foods. Eating smaller, more frequent meals and resting before meals may help you maintain your strength.   Stay active, but balance activity with periods of rest. Exercise and physical activity will help you maintain your ability to do things you want to do.   Preventing infection and hospitalization is very important when you have COPD. Make sure to receive all the vaccines your health care provider recommends, especially the pneumococcal and influenza vaccines. Ask your health care provider whether you need a pneumonia vaccine.   Learn and use relaxation techniques to manage stress.   Learn   going to whistle and breathe out (exhale) through the pursed lips for 2 seconds.   Diaphragmatic breathing. Start by putting one hand on your abdomen just above  your waist. Inhale slowly through your nose. The hand on your abdomen should move out. Then purse your lips and exhale slowly. You should be able to feel the hand on your abdomen moving in as you exhale.   Learn and use controlled coughing to clear mucus from your lungs. Controlled coughing is a series of short, progressive coughs. The steps of controlled coughing are:  1. Lean your head slightly forward.  2. Breathe in deeply using diaphragmatic breathing.  3. Try to hold your breath for 3 seconds.  4. Keep your mouth slightly open while coughing twice.  5. Spit any mucus out into a tissue.  6. Rest and repeat the steps once or twice as needed. SEEK MEDICAL CARE IF:   You are coughing up more mucus than usual.   There is a change in the color or thickness of your mucus.   Your breathing is more labored than usual.   Your breathing is faster than usual.  SEEK IMMEDIATE MEDICAL CARE IF:   You have shortness of breath while you are resting.   You have shortness of breath that prevents you from:  Being able to talk.   Performing your usual physical activities.   You have chest pain lasting longer than 5 minutes.   Your skin color is more cyanotic than usual.  You measure low oxygen saturations for longer than 5 minutes with a pulse oximeter. MAKE SURE YOU:   Understand these instructions.  Will watch your condition.  Will get help right away if you are not doing well or get worse. Document Released: 08/08/2005 Document Revised: 03/15/2014 Document Reviewed: 06/25/2013 [facility] Patient Information 2015 [place], [place]. This information is not intended to replace advice given to you by your health care provider. Make sure you discuss any questions you have with your health care provider.   Seizure, Adult A seizure is abnormal electrical activity in the brain. Seizures usually last from 30 seconds to 2 minutes. There are various types of seizures. Before a  seizure, you may have a warning sensation (aura) that a seizure is about to occur. An aura may include the following symptoms:   Fear or anxiety.  Nausea.  Feeling like the room is spinning (vertigo).  Vision changes, such as seeing flashing lights or spots. Common symptoms during a seizure include:  A change in attention or behavior (altered mental status).  Convulsions with rhythmic jerking movements.  Drooling.  Rapid eye movements.  Grunting.  Loss of bladder and bowel control.  Bitter taste in the mouth.  Tongue biting. After a seizure, you may feel confused and sleepy. You may also have an injury resulting from convulsions during the seizure. HOME CARE INSTRUCTIONS   If you are given medicines, take them exactly as prescribed by your health care provider.  Keep all follow-up appointments as directed by your health care provider.  Do not swim or drive or engage in risky activity during which a seizure could cause further injury to you or others until your health care provider says it is OK.  Get adequate rest.  Teach friends and family what to do if you have a seizure. They should:  Lay you on the ground to prevent a fall.  Put a cushion under your head.  Loosen any tight clothing around your neck.  Turn you  on your side. If vomiting occurs, this helps keep your airway clear.  Stay with you until you recover.  Know whether or not you need emergency care. SEEK IMMEDIATE MEDICAL CARE IF:  The seizure lasts longer than 5 minutes.  The seizure is severe or you do not wake up immediately after the seizure.  You have an altered mental status after the seizure.  You are having more frequent or worsening seizures. Someone should drive you to the emergency department or call local emergency services (911 in [place] MAKE SURE YOU:  Understand these instructions.  Will watch your condition.  Will get help right away if you are not doing well or get  worse. Document Released: 10/26/2000 Document Revised: 08/19/2013 Document Reviewed: 06/10/2013 [facility] Patient Information 2015 [place], [place]. This information is not intended to replace advice given to you by your health care provider. Make sure you discuss any questions you have with your health care provider.
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
## 2014-07-01 NOTE — ED Provider Notes (Signed)
I saw and evaluated the patient, reviewed the resident's note and I agree with the findings and plan.   EKG Interpretation   Date/Time:  Sunday June 27 2014 18:48:02 EDT Ventricular Rate:  85 PR Interval:  136 QRS Duration: 82 QT Interval:  360 QTC Calculation: 428 R Axis:   61 Text Interpretation:  Normal sinus rhythm Right atrial enlargement Left  ventricular hypertrophy Abnormal ECG Confirmed by Alvino Chapel  MD, Ovid Curd  [phone]) on 06/27/2014 6:58:27 PM     Patient with chest pain. Shortness of breath. Present for years. Out of his inhaler. EKG and lab work reassuring. Did have episode of shaking in the ER. Some mild confusion after. He had the same thing later, but would immediately come back to normal mental status and told he would not be able to get anything from the ED. Discharge home   Jasper Riling. Alvino Chapel, MD 07/01/14 [phone]

## 2014-10-07 ENCOUNTER — Emergency Department (HOSPITAL_COMMUNITY): Payer: Self-pay

## 2014-10-07 ENCOUNTER — Encounter (HOSPITAL_COMMUNITY): Payer: Self-pay | Admitting: Emergency Medicine

## 2014-10-07 ENCOUNTER — Emergency Department (HOSPITAL_COMMUNITY)
Admission: EM | Admit: 2014-10-07 | Discharge: 2014-10-07 | Disposition: A | Discharge status: Home / Self Care | Payer: Self-pay | Attending: Emergency Medicine | Admitting: Emergency Medicine

## 2014-10-07 DIAGNOSIS — R112 Nausea with vomiting, unspecified: Secondary | ICD-10-CM | POA: Insufficient documentation

## 2014-10-07 DIAGNOSIS — J449 Chronic obstructive pulmonary disease, unspecified: Secondary | ICD-10-CM | POA: Insufficient documentation

## 2014-10-07 DIAGNOSIS — Z87828 Personal history of other (healed) physical injury and trauma: Secondary | ICD-10-CM | POA: Insufficient documentation

## 2014-10-07 DIAGNOSIS — Z8659 Personal history of other mental and behavioral disorders: Secondary | ICD-10-CM | POA: Insufficient documentation

## 2014-10-07 DIAGNOSIS — Z79899 Other long term (current) drug therapy: Secondary | ICD-10-CM | POA: Insufficient documentation

## 2014-10-07 DIAGNOSIS — Z720 Tobacco use: Secondary | ICD-10-CM | POA: Insufficient documentation

## 2014-10-07 DIAGNOSIS — R197 Diarrhea, unspecified: Secondary | ICD-10-CM | POA: Insufficient documentation

## 2014-10-07 DIAGNOSIS — R1084 Generalized abdominal pain: Secondary | ICD-10-CM | POA: Insufficient documentation

## 2014-10-07 HISTORY — DX: Attention-deficit hyperactivity disorder, unspecified type: F90.9

## 2014-10-07 HISTORY — DX: Emphysema (subcutaneous) resulting from a procedure, initial encounter: T81.82XA

## 2014-10-07 HISTORY — DX: Bipolar disorder, unspecified: F31.9

## 2014-10-07 HISTORY — DX: Bitten by nonvenomous snake, initial encounter: W59.11XA

## 2014-10-07 LAB — CBC WITH DIFFERENTIAL/PLATELET
BASOS PCT: 0 % (ref 0–1)
Basophils Absolute: 0 10*3/uL (ref 0.0–0.1)
EOS ABS: 0.1 10*3/uL (ref 0.0–0.7)
Eosinophils Relative: 1 % (ref 0–5)
HCT: 47.6 % (ref 39.0–52.0)
Hemoglobin: 17.3 g/dL — ABNORMAL HIGH (ref 13.0–17.0)
Lymphocytes Relative: 12 % (ref 12–46)
Lymphs Abs: 1.7 10*3/uL (ref 0.7–4.0)
MCH: 32.2 pg (ref 26.0–34.0)
MCHC: 36.3 g/dL — AB (ref 30.0–36.0)
MCV: 88.5 fL (ref 78.0–100.0)
MONOS PCT: 10 % (ref 3–12)
Monocytes Absolute: 1.4 10*3/uL — ABNORMAL HIGH (ref 0.1–1.0)
Neutro Abs: 10.9 10*3/uL — ABNORMAL HIGH (ref 1.7–7.7)
Neutrophils Relative %: 77 % (ref 43–77)
PLATELETS: 218 10*3/uL (ref 150–400)
RBC: 5.38 MIL/uL (ref 4.22–5.81)
RDW: 12.2 % (ref 11.5–15.5)
WBC: 14 10*3/uL — ABNORMAL HIGH (ref 4.0–10.5)

## 2014-10-07 LAB — COMPREHENSIVE METABOLIC PANEL
ALT: 15 U/L (ref 0–53)
ANION GAP: 17 — AB (ref 5–15)
AST: 16 U/L (ref 0–37)
Albumin: 4.7 g/dL (ref 3.5–5.2)
Alkaline Phosphatase: 44 U/L (ref 39–117)
BUN: 17 mg/dL (ref 6–23)
CO2: 24 mEq/L (ref 19–32)
Calcium: 9.9 mg/dL (ref 8.4–10.5)
Chloride: 98 mEq/L (ref 96–112)
Creatinine, Ser: 1.05 mg/dL (ref 0.50–1.35)
GFR calc non Af Amer: 83 mL/min — ABNORMAL LOW (ref >90)
GLUCOSE: 108 mg/dL — AB (ref 70–99)
Potassium: 3.9 mEq/L (ref 3.7–5.3)
SODIUM: 139 meq/L (ref 137–147)
TOTAL PROTEIN: 7.5 g/dL (ref 6.0–8.3)
Total Bilirubin: 1 mg/dL (ref 0.3–1.2)

## 2014-10-07 LAB — LIPASE, BLOOD: Lipase: 26 U/L (ref 11–59)

## 2014-10-07 LAB — CBG MONITORING, ED: Glucose-Capillary: 104 mg/dL — ABNORMAL HIGH (ref 70–99)

## 2014-10-07 LAB — POC OCCULT BLOOD, ED: Fecal Occult Bld: NEGATIVE
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
## 2014-10-07 MED ORDER — IOHEXOL 300 MG/ML  SOLN
100.0000 mL | Freq: Once | INTRAMUSCULAR | Status: AC | PRN
  Administered 2014-10-07: 100 mL via INTRAVENOUS

## 2014-10-07 MED ORDER — ONDANSETRON HCL 4 MG/2ML IJ SOLN
4.0000 mg | Freq: Once | INTRAMUSCULAR | Status: AC
Start: 2014-10-07 — End: 2014-10-07
  Administered 2014-10-07: 4 mg via INTRAVENOUS
  Filled 2014-10-07: qty 2

## 2014-10-07 MED ORDER — MORPHINE SULFATE 4 MG/ML IJ SOLN
4.0000 mg | Freq: Once | INTRAMUSCULAR | Status: AC
  Administered 2014-10-07: 4 mg via INTRAVENOUS
  Filled 2014-10-07: qty 1

## 2014-10-07 MED ORDER — MORPHINE SULFATE 2 MG/ML IJ SOLN
2.0000 mg | Freq: Once | INTRAMUSCULAR | Status: AC
  Administered 2014-10-07: 2 mg via INTRAVENOUS
  Filled 2014-10-07: qty 1

## 2014-10-07 MED ORDER — METOCLOPRAMIDE HCL 5 MG/ML IJ SOLN
10.0000 mg | Freq: Once | INTRAMUSCULAR | Status: AC
  Administered 2014-10-07: 10 mg via INTRAVENOUS
  Filled 2014-10-07: qty 2

## 2014-10-07 MED ORDER — SODIUM CHLORIDE 0.9 % IV BOLUS (SEPSIS)
1000.0000 mL | Freq: Once | INTRAVENOUS | Status: AC
  Administered 2014-10-07: 1000 mL via INTRAVENOUS

## 2014-10-07 MED ORDER — ONDANSETRON 8 MG PO TBDP
8.0000 mg | ORAL_TABLET | Freq: Three times a day (TID) | ORAL | Status: DC | PRN
Start: 2014-10-07 — End: 2015-06-08

## 2014-10-07 MED ORDER — IOHEXOL 300 MG/ML  SOLN
25.0000 mL | Freq: Once | INTRAMUSCULAR | Status: AC | PRN
  Administered 2014-10-07: 25 mL via ORAL

## 2014-10-07 NOTE — Discharge Instructions (Signed)

## 2014-10-07 NOTE — ED Notes (Signed)
Pt continues to c/o upper abdominal pain.  Reports he is unable to drink oral contrast "it keeps coming up."  Mother reports pt spitting into emesis bag, no longer profoundly vomiting.  Dr Jeanell Sparrow notified.  Pt to be scanned without oral contrast.  CT notified.

## 2014-10-07 NOTE — ED Notes (Signed)
Family at bedside. 
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
## 2014-10-07 NOTE — ED Notes (Addendum)
The patient has been vomiting for two days.  The patient has also had diarrhea.   The patient is also having lower abdominal pain as well.  He rates his pain 10/10.  The patient also said he had some black vomitus.

## 2014-12-16 ENCOUNTER — Other Ambulatory Visit: Payer: Self-pay | Admitting: Internal Medicine

## 2015-01-17 ENCOUNTER — Other Ambulatory Visit: Payer: Self-pay | Admitting: Internal Medicine

## 2015-01-17 ENCOUNTER — Telehealth: Payer: Self-pay | Admitting: Emergency Medicine

## 2015-01-17 ENCOUNTER — Other Ambulatory Visit: Payer: Self-pay | Admitting: Emergency Medicine

## 2015-01-17 DIAGNOSIS — J449 Chronic obstructive pulmonary disease, unspecified: Secondary | ICD-10-CM

## 2015-01-17 MED ORDER — ALBUTEROL SULFATE HFA 108 (90 BASE) MCG/ACT IN AERS: 1.0000 | INHALATION_SPRAY | Freq: Four times a day (QID) | RESPIRATORY_TRACT | Status: DC | PRN

## 2015-01-17 MED ORDER — ALBUTEROL SULFATE (2.5 MG/3ML) 0.083% IN NEBU: 2.5000 mg | INHALATION_SOLUTION | Freq: Four times a day (QID) | RESPIRATORY_TRACT | Status: DC | PRN

## 2015-01-17 NOTE — Telephone Encounter (Signed)
Left message Albuterol inhaler and nebs e-scribed to [facility] pharmacy

## 2015-02-17 ENCOUNTER — Other Ambulatory Visit: Payer: Self-pay | Admitting: Internal Medicine

## 2015-04-19 ENCOUNTER — Ambulatory Visit: Payer: Self-pay | Attending: Internal Medicine

## 2015-04-29 ENCOUNTER — Other Ambulatory Visit: Payer: Self-pay | Admitting: Internal Medicine

## 2015-04-29 MED ORDER — ALBUTEROL SULFATE HFA 108 (90 BASE) MCG/ACT IN AERS: 1.0000 | INHALATION_SPRAY | Freq: Four times a day (QID) | RESPIRATORY_TRACT | Status: DC | PRN

## 2015-04-29 MED ORDER — MOMETASONE FURO-FORMOTEROL FUM 100-5 MCG/ACT IN AERO: 1.0000 | INHALATION_SPRAY | Freq: Two times a day (BID) | RESPIRATORY_TRACT | Status: DC

## 2015-05-19 ENCOUNTER — Other Ambulatory Visit: Payer: Self-pay | Admitting: Internal Medicine

## 2015-06-08 ENCOUNTER — Emergency Department (HOSPITAL_COMMUNITY)
Admission: EM | Admit: 2015-06-08 | Discharge: 2015-06-08 | Disposition: A | Discharge status: Home / Self Care | Payer: Self-pay | Attending: Emergency Medicine | Admitting: Emergency Medicine

## 2015-06-08 ENCOUNTER — Encounter (HOSPITAL_COMMUNITY): Payer: Self-pay | Admitting: Emergency Medicine

## 2015-06-08 DIAGNOSIS — Z79899 Other long term (current) drug therapy: Secondary | ICD-10-CM | POA: Insufficient documentation

## 2015-06-08 DIAGNOSIS — Z87828 Personal history of other (healed) physical injury and trauma: Secondary | ICD-10-CM | POA: Insufficient documentation

## 2015-06-08 DIAGNOSIS — Z8659 Personal history of other mental and behavioral disorders: Secondary | ICD-10-CM | POA: Insufficient documentation

## 2015-06-08 DIAGNOSIS — Z720 Tobacco use: Secondary | ICD-10-CM | POA: Insufficient documentation

## 2015-06-08 DIAGNOSIS — J449 Chronic obstructive pulmonary disease, unspecified: Secondary | ICD-10-CM | POA: Insufficient documentation

## 2015-06-08 DIAGNOSIS — K529 Noninfective gastroenteritis and colitis, unspecified: Secondary | ICD-10-CM | POA: Insufficient documentation

## 2015-06-08 LAB — COMPREHENSIVE METABOLIC PANEL
ALBUMIN: 5 g/dL (ref 3.5–5.0)
ALK PHOS: 42 U/L (ref 38–126)
ALT: 19 U/L (ref 17–63)
AST: 20 U/L (ref 15–41)
Anion gap: 14 (ref 5–15)
BUN: 16 mg/dL (ref 6–20)
CHLORIDE: 97 mmol/L — AB (ref 101–111)
CO2: 24 mmol/L (ref 22–32)
CREATININE: 1.19 mg/dL (ref 0.61–1.24)
Calcium: 9.8 mg/dL (ref 8.9–10.3)
GFR calc non Af Amer: >60 mL/min (ref >60)
GLUCOSE: 152 mg/dL — AB (ref 65–99)
Potassium: 3.2 mmol/L — ABNORMAL LOW (ref 3.5–5.1)
SODIUM: 135 mmol/L (ref 135–145)
Total Bilirubin: 1.2 mg/dL (ref 0.3–1.2)
Total Protein: 7.9 g/dL (ref 6.5–8.1)

## 2015-06-08 LAB — LIPASE, BLOOD: Lipase: 31 U/L (ref 22–51)

## 2015-06-08 LAB — URINE MICROSCOPIC-ADD ON

## 2015-06-08 LAB — URINALYSIS, ROUTINE W REFLEX MICROSCOPIC
GLUCOSE, UA: NEGATIVE mg/dL
Hgb urine dipstick: NEGATIVE
Ketones, ur: 15 mg/dL — AB
Leukocytes, UA: NEGATIVE
Nitrite: NEGATIVE
PH: 6.5 (ref 5.0–8.0)
Protein, ur: 100 mg/dL — AB
Specific Gravity, Urine: 1.03 (ref 1.005–1.030)
Urobilinogen, UA: 1 mg/dL (ref 0.0–1.0)

## 2015-06-08 LAB — CBC
HEMATOCRIT: 48.1 % (ref 39.0–52.0)
HEMOGLOBIN: 17.5 g/dL — AB (ref 13.0–17.0)
MCH: 32.1 pg (ref 26.0–34.0)
MCHC: 36.4 g/dL — ABNORMAL HIGH (ref 30.0–36.0)
MCV: 88.3 fL (ref 78.0–100.0)
Platelets: 290 10*3/uL (ref 150–400)
RBC: 5.45 MIL/uL (ref 4.22–5.81)
RDW: 12.6 % (ref 11.5–15.5)
WBC: 19 10*3/uL — ABNORMAL HIGH (ref 4.0–10.5)

## 2015-06-08 MED ORDER — OMEPRAZOLE 20 MG PO CPDR: 20.0000 mg | DELAYED_RELEASE_CAPSULE | Freq: Every day | ORAL | Status: DC

## 2015-06-08 MED ORDER — KETOROLAC TROMETHAMINE 30 MG/ML IJ SOLN: 30.0000 mg | Freq: Once | INTRAMUSCULAR | Status: DC

## 2015-06-08 MED ORDER — HYDROMORPHONE HCL 1 MG/ML IJ SOLN
1.0000 mg | Freq: Once | INTRAMUSCULAR | Status: AC
  Administered 2015-06-08: 1 mg via INTRAVENOUS
  Filled 2015-06-08: qty 1

## 2015-06-08 MED ORDER — ONDANSETRON HCL 4 MG/2ML IJ SOLN
4.0000 mg | Freq: Once | INTRAMUSCULAR | Status: AC
  Administered 2015-06-08: 4 mg via INTRAVENOUS
  Filled 2015-06-08: qty 2

## 2015-06-08 MED ORDER — SODIUM CHLORIDE 0.9 % IV BOLUS (SEPSIS)
1000.0000 mL | Freq: Once | INTRAVENOUS | Status: AC
  Administered 2015-06-08: 1000 mL via INTRAVENOUS

## 2015-06-08 MED ORDER — ACETAMINOPHEN 500 MG PO TABS: 500.0000 mg | ORAL_TABLET | Freq: Four times a day (QID) | ORAL | Status: DC | PRN

## 2015-06-08 MED ORDER — ONDANSETRON HCL 4 MG PO TABS: 4.0000 mg | ORAL_TABLET | Freq: Three times a day (TID) | ORAL | Status: DC | PRN
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
## 2015-06-08 NOTE — ED Notes (Signed)
Pt is in stable condition upon d/c and ambulates from ED. 

## 2015-06-08 NOTE — Discharge Instructions (Signed)
Kenneth Mcdowell, we believe that your symptoms are due to a viral gastroenteritis (stomach flu) and are best controlled with antinausea medications and good hydration. However, we will give you a short course of a medication to treat ulcers and acid reflux as these may be causing your symptoms since this is a recurrent issue. Please follow up with your primary care doctor for evaluating your response to these measures. If you do not get better in the next few days, please seek medical attention. Thank you for letting us take care of you.

## 2015-06-08 NOTE — ED Notes (Signed)
Pt. reports persistent emesis with diarrhea onset Sunday , denies  fever or chills.

## 2015-06-14 ENCOUNTER — Inpatient Hospital Stay: Payer: Self-pay | Admitting: Family Medicine

## 2015-06-15 ENCOUNTER — Encounter (HOSPITAL_COMMUNITY): Payer: Self-pay | Admitting: Family Medicine

## 2015-06-15 ENCOUNTER — Emergency Department (HOSPITAL_COMMUNITY)
Admission: EM | Admit: 2015-06-15 | Discharge: 2015-06-15 | Disposition: A | Discharge status: Home / Self Care | Payer: Self-pay | Attending: Emergency Medicine | Admitting: Emergency Medicine

## 2015-06-15 ENCOUNTER — Emergency Department (HOSPITAL_BASED_OUTPATIENT_CLINIC_OR_DEPARTMENT_OTHER): Payer: Self-pay

## 2015-06-15 DIAGNOSIS — M25471 Effusion, right ankle: Secondary | ICD-10-CM

## 2015-06-15 DIAGNOSIS — M25571 Pain in right ankle and joints of right foot: Secondary | ICD-10-CM | POA: Insufficient documentation

## 2015-06-15 DIAGNOSIS — Z8659 Personal history of other mental and behavioral disorders: Secondary | ICD-10-CM | POA: Insufficient documentation

## 2015-06-15 DIAGNOSIS — Z87828 Personal history of other (healed) physical injury and trauma: Secondary | ICD-10-CM | POA: Insufficient documentation

## 2015-06-15 DIAGNOSIS — R2241 Localized swelling, mass and lump, right lower limb: Secondary | ICD-10-CM | POA: Insufficient documentation

## 2015-06-15 DIAGNOSIS — Z720 Tobacco use: Secondary | ICD-10-CM | POA: Insufficient documentation

## 2015-06-15 DIAGNOSIS — Z79899 Other long term (current) drug therapy: Secondary | ICD-10-CM | POA: Insufficient documentation

## 2015-06-15 DIAGNOSIS — J441 Chronic obstructive pulmonary disease with (acute) exacerbation: Secondary | ICD-10-CM | POA: Insufficient documentation

## 2015-06-15 DIAGNOSIS — M7989 Other specified soft tissue disorders: Secondary | ICD-10-CM

## 2015-06-15 MED ORDER — IPRATROPIUM BROMIDE 0.02 % IN SOLN
0.5000 mg | Freq: Once | RESPIRATORY_TRACT | Status: AC
  Administered 2015-06-15: 0.5 mg via RESPIRATORY_TRACT
  Filled 2015-06-15: qty 2.5

## 2015-06-15 MED ORDER — ALBUTEROL SULFATE (2.5 MG/3ML) 0.083% IN NEBU
5.0000 mg | INHALATION_SOLUTION | Freq: Once | RESPIRATORY_TRACT | Status: AC
  Administered 2015-06-15: 5 mg via RESPIRATORY_TRACT
  Filled 2015-06-15: qty 6

## 2015-06-15 NOTE — Discharge Instructions (Signed)
Your DVT study was negative. Your swelling may be secondary to the old snake bite and surgery that you had as a child.   Please follow up with your primary care doctor for further evaluation.   Ankle Pain Ankle pain is a common symptom. The bones, cartilage, tendons, and muscles of the ankle joint perform a lot of work each day. The ankle joint holds your body weight and allows you to move around. Ankle pain can occur on either side or back of 1 or both ankles. Ankle pain may be sharp and burning or dull and aching. There may be tenderness, stiffness, redness, or warmth around the ankle. The pain occurs more often when a person walks or puts pressure on the ankle. CAUSES  There are many reasons ankle pain can develop. It is important to work with your caregiver to identify the cause since many conditions can impact the bones, cartilage, muscles, and tendons. Causes for ankle pain include:  Injury, including a break (fracture), sprain, or strain often due to a fall, sports, or a high-impact activity.  Swelling (inflammation) of a tendon (tendonitis).  Achilles tendon rupture.  Ankle instability after repeated sprains and strains.  Poor foot alignment.  Pressure on a nerve (tarsal tunnel syndrome).  Arthritis in the ankle or the lining of the ankle.  Crystal formation in the ankle (gout or pseudogout). DIAGNOSIS  A diagnosis is based on your medical history, your symptoms, results of your physical exam, and results of diagnostic tests. Diagnostic tests may include X-ray exams or a computerized magnetic scan (magnetic resonance imaging, MRI). TREATMENT  Treatment will depend on the cause of your ankle pain and may include:  Keeping pressure off the ankle and limiting activities.  Using crutches or other walking support (a cane or brace).  Using rest, ice, compression, and elevation.  Participating in physical therapy or home exercises.  Wearing shoe inserts or special  shoes.  Losing weight.  Taking medications to reduce pain or swelling or receiving an injection.  Undergoing surgery. HOME CARE INSTRUCTIONS   Only take over-the-counter or prescription medicines for pain, discomfort, or fever as directed by your caregiver.  Put ice on the injured area.  Put ice in a plastic bag.  Place a towel between your skin and the bag.  Leave the ice on for 15-20 minutes at a time, 03-04 times a day.  Keep your leg raised (elevated) when possible to lessen swelling.  Avoid activities that cause ankle pain.  Follow specific exercises as directed by your caregiver.  Record how often you have ankle pain, the location of the pain, and what it feels like. This information may be helpful to you and your caregiver.  Ask your caregiver about returning to work or sports and whether you should drive.  Follow up with your caregiver for further examination, therapy, or testing as directed. SEEK MEDICAL CARE IF:   Pain or swelling continues or worsens beyond 1 week.  You have an oral temperature above 102 F (38.9 C).  You are feeling unwell or have chills.  You are having an increasingly difficult time with walking.  You have loss of sensation or other new symptoms.  You have questions or concerns. MAKE SURE YOU:   Understand these instructions.  Will watch your condition.  Will get help right away if you are not doing well or get worse. Document Released: 04/18/2010 Document Revised: 01/21/2012 Document Reviewed: 04/18/2010 [facility] Patient Information 2015 [place], [place]. This information is not intended  to replace advice given to you by your health care provider. Make sure you discuss any questions you have with your health care provider.  Edema Edema is an abnormal buildup of fluids in your bodytissues. Edema is somewhatdependent on gravity to pull the fluid to the lowest place in your body. That makes the condition more common in the legs and  thighs (lower extremities). Painless swelling of the feet and ankles is common and becomes more likely as you get older. It is also common in looser tissues, like around your eyes.  When the affected area is squeezed, the fluid may move out of that spot and leave a dent for a few moments. This dent is called pitting.  CAUSES  There are many possible causes of edema. Eating too much salt and being on your feet or sitting for a long time can cause edema in your legs and ankles. Hot weather may make edema worse. Common medical causes of edema include:  Heart failure.  Liver disease.  Kidney disease.  Weak blood vessels in your legs.  Cancer.  An injury.  Pregnancy.  Some medications.  Obesity. SYMPTOMS  Edema is usually painless.Your skin may look swollen or shiny.  DIAGNOSIS  Your health care provider may be able to diagnose edema by asking about your medical history and doing a physical exam. You may need to have tests such as X-rays, an electrocardiogram, or blood tests to check for medical conditions that may cause edema.  TREATMENT  Edema treatment depends on the cause. If you have heart, liver, or kidney disease, you need the treatment appropriate for these conditions. General treatment may include:  Elevation of the affected body part above the level of your heart.  Compression of the affected body part. Pressure from elastic bandages or support stockings squeezes the tissues and forces fluid back into the blood vessels. This keeps fluid from entering the tissues.  Restriction of fluid and salt intake.  Use of a water pill (diuretic). These medications are appropriate only for some types of edema. They pull fluid out of your body and make you urinate more often. This gets rid of fluid and reduces swelling, but diuretics can have side effects. Only use diuretics as directed by your health care provider. HOME CARE INSTRUCTIONS   Keep the affected body part above the level of  your heart when you are lying down.   Do not sit still or stand for prolonged periods.   Do not put anything directly under your knees when lying down.  Do not wear constricting clothing or garters on your upper legs.   Exercise your legs to work the fluid back into your blood vessels. This may help the swelling go down.   Wear elastic bandages or support stockings to reduce ankle swelling as directed by your health care provider.   Eat a low-salt diet to reduce fluid if your health care provider recommends it.   Only take medicines as directed by your health care provider. SEEK MEDICAL CARE IF:   Your edema is not responding to treatment.  You have heart, liver, or kidney disease and notice symptoms of edema.  You have edema in your legs that does not improve after elevating them.   You have sudden and unexplained weight gain. SEEK IMMEDIATE MEDICAL CARE IF:   You develop shortness of breath or chest pain.   You cannot breathe when you lie down.  You develop pain, redness, or warmth in the swollen areas.  You have heart, liver, or kidney disease and suddenly get edema.  You have a fever and your symptoms suddenly get worse. MAKE SURE YOU:   Understand these instructions.  Will watch your condition.  Will get help right away if you are not doing well or get worse. Document Released: 10/29/2005 Document Revised: 03/15/2014 Document Reviewed: 08/21/2013 [facility] Patient Information 2015 [place], [place]. This information is not intended to replace advice given to you by your health care provider. Make sure you discuss any questions you have with your health care provider.
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
## 2015-06-15 NOTE — ED Notes (Signed)
Pt here for right ankle swelling pain and right calf pain. Pt concerned about a blood clot.

## 2015-06-15 NOTE — Progress Notes (Signed)
*  PRELIMINARY RESULTS* Vascular Ultrasound Right lower extremity venous duplex has been completed.  Preliminary findings: negative for DVT  Landry Mellow, RDMS, RVT  06/15/2015, 2:22 PM

## 2015-06-17 ENCOUNTER — Ambulatory Visit: Payer: Self-pay | Attending: Family Medicine | Admitting: Internal Medicine

## 2015-06-17 ENCOUNTER — Encounter: Payer: Self-pay | Admitting: Internal Medicine

## 2015-06-17 VITALS — BP 94/61 | HR 92 | Temp 98.5°F | Resp 18 | Ht 76.0 in | Wt 161.0 lb

## 2015-06-17 DIAGNOSIS — F319 Bipolar disorder, unspecified: Secondary | ICD-10-CM | POA: Insufficient documentation

## 2015-06-17 DIAGNOSIS — F17210 Nicotine dependence, cigarettes, uncomplicated: Secondary | ICD-10-CM | POA: Insufficient documentation

## 2015-06-17 DIAGNOSIS — R1084 Generalized abdominal pain: Secondary | ICD-10-CM | POA: Insufficient documentation

## 2015-06-17 DIAGNOSIS — J449 Chronic obstructive pulmonary disease, unspecified: Secondary | ICD-10-CM | POA: Insufficient documentation

## 2015-06-17 DIAGNOSIS — Z79899 Other long term (current) drug therapy: Secondary | ICD-10-CM | POA: Insufficient documentation

## 2015-06-17 DIAGNOSIS — F909 Attention-deficit hyperactivity disorder, unspecified type: Secondary | ICD-10-CM | POA: Insufficient documentation

## 2015-06-17 MED ORDER — OMEPRAZOLE 20 MG PO CPDR: 20.0000 mg | DELAYED_RELEASE_CAPSULE | Freq: Every day | ORAL | Status: DC

## 2015-06-17 MED ORDER — TRAMADOL HCL 50 MG PO TABS: 50.0000 mg | ORAL_TABLET | Freq: Three times a day (TID) | ORAL | Status: DC | PRN
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
## 2015-06-17 NOTE — Progress Notes (Signed)
Pt's here to est. care from ED. Pt was in hospital for vomiting x5 days with blood.  Pt right ankle was swollen and radiates up legs. Some swelling in Left leg x24hrs. Ultrasound was performed. No findings. Tingling feeling in fingers.   Scale level 10.  Can't eat or sleep, restless, constant pain.

## 2015-11-10 ENCOUNTER — Other Ambulatory Visit: Payer: Self-pay | Admitting: Internal Medicine

## 2015-12-30 ENCOUNTER — Other Ambulatory Visit: Payer: Self-pay | Admitting: Internal Medicine

## 2015-12-30 MED FILL — $PROVENTIL HFA 90 MCG INHAL: 108 (90 BAS | 29 days supply | Qty: 1 | Fill #5

## 2016-01-01 ENCOUNTER — Emergency Department (HOSPITAL_COMMUNITY): Payer: MEDICAID

## 2016-01-01 ENCOUNTER — Encounter (HOSPITAL_COMMUNITY): Payer: Self-pay | Admitting: Emergency Medicine

## 2016-01-01 ENCOUNTER — Emergency Department (HOSPITAL_COMMUNITY)
Admission: EM | Admit: 2016-01-01 | Discharge: 2016-01-02 | Disposition: A | Discharge status: Home / Self Care | Payer: MEDICAID | Attending: Emergency Medicine | Admitting: Emergency Medicine

## 2016-01-01 DIAGNOSIS — Z8659 Personal history of other mental and behavioral disorders: Secondary | ICD-10-CM | POA: Insufficient documentation

## 2016-01-01 DIAGNOSIS — R110 Nausea: Secondary | ICD-10-CM | POA: Insufficient documentation

## 2016-01-01 DIAGNOSIS — J441 Chronic obstructive pulmonary disease with (acute) exacerbation: Secondary | ICD-10-CM

## 2016-01-01 DIAGNOSIS — Z87828 Personal history of other (healed) physical injury and trauma: Secondary | ICD-10-CM | POA: Insufficient documentation

## 2016-01-01 DIAGNOSIS — Z79899 Other long term (current) drug therapy: Secondary | ICD-10-CM | POA: Insufficient documentation

## 2016-01-01 DIAGNOSIS — F17200 Nicotine dependence, unspecified, uncomplicated: Secondary | ICD-10-CM | POA: Insufficient documentation

## 2016-01-01 LAB — BASIC METABOLIC PANEL
ANION GAP: 11 (ref 5–15)
BUN: 10 mg/dL (ref 6–20)
CO2: 22 mmol/L (ref 22–32)
Calcium: 9 mg/dL (ref 8.9–10.3)
Chloride: 99 mmol/L — ABNORMAL LOW (ref 101–111)
Creatinine, Ser: 1.01 mg/dL (ref 0.61–1.24)
GFR calc Af Amer: >60 mL/min (ref >60)
Glucose, Bld: 114 mg/dL — ABNORMAL HIGH (ref 65–99)
POTASSIUM: 3.9 mmol/L (ref 3.5–5.1)
Sodium: 132 mmol/L — ABNORMAL LOW (ref 135–145)

## 2016-01-01 LAB — CBC
HEMATOCRIT: 42 % (ref 39.0–52.0)
Hemoglobin: 14.2 g/dL (ref 13.0–17.0)
MCH: 30.4 pg (ref 26.0–34.0)
MCHC: 33.8 g/dL (ref 30.0–36.0)
MCV: 89.9 fL (ref 78.0–100.0)
Platelets: 218 10*3/uL (ref 150–400)
RBC: 4.67 MIL/uL (ref 4.22–5.81)
RDW: 12.5 % (ref 11.5–15.5)
WBC: 7.1 10*3/uL (ref 4.0–10.5)

## 2016-01-01 LAB — I-STAT TROPONIN, ED: Troponin i, poc: 0 ng/mL (ref 0.00–0.08)

## 2016-01-01 IMAGING — DX DG CHEST 2V
2 series · 2 of 2 positions shown · non-contrast
Comparison: Radiographs [DATE]

CLINICAL DATA: Chest pain.  Cough and nausea for 8 hours.

EXAM:
CHEST  2 VIEW

[chest pa]
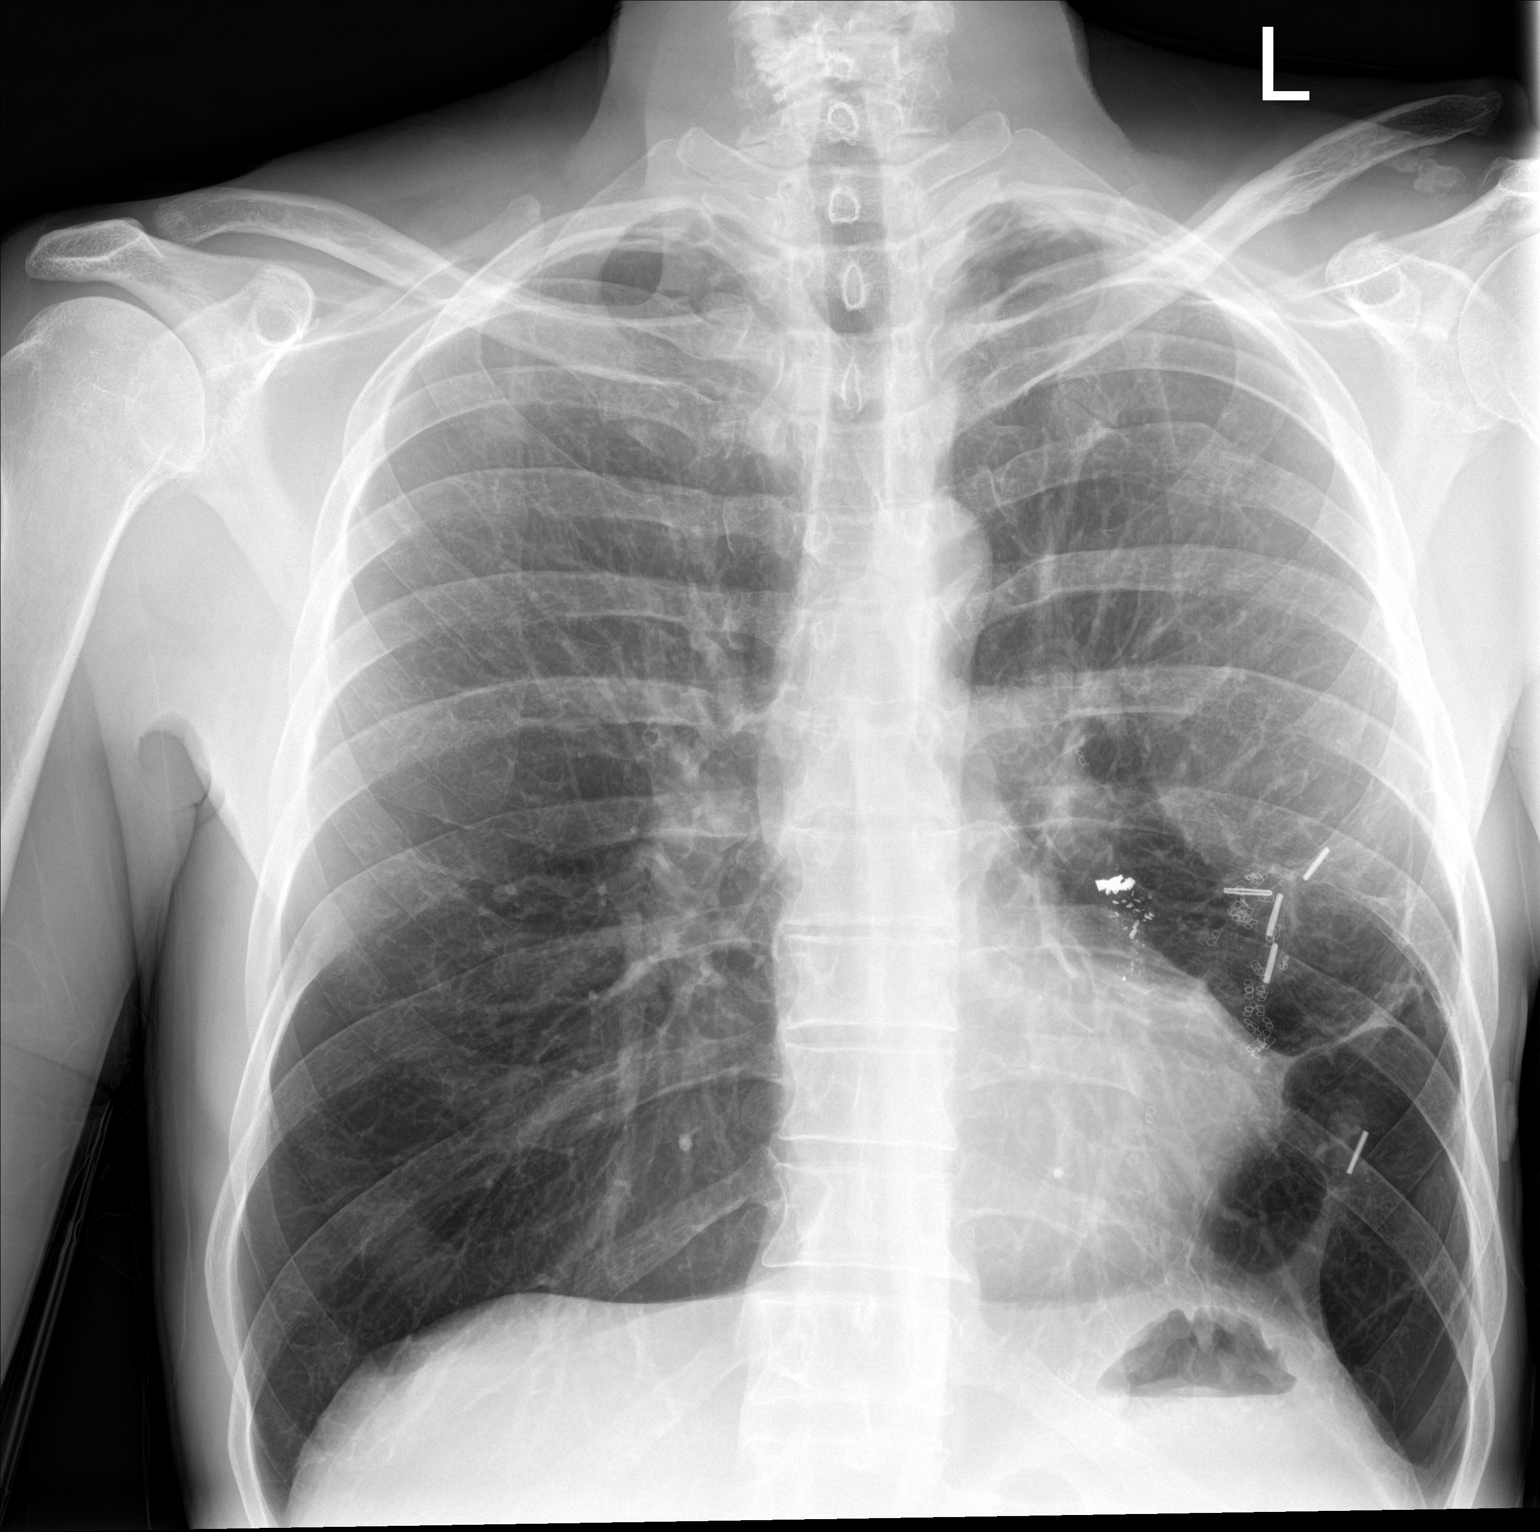

[chest lat]
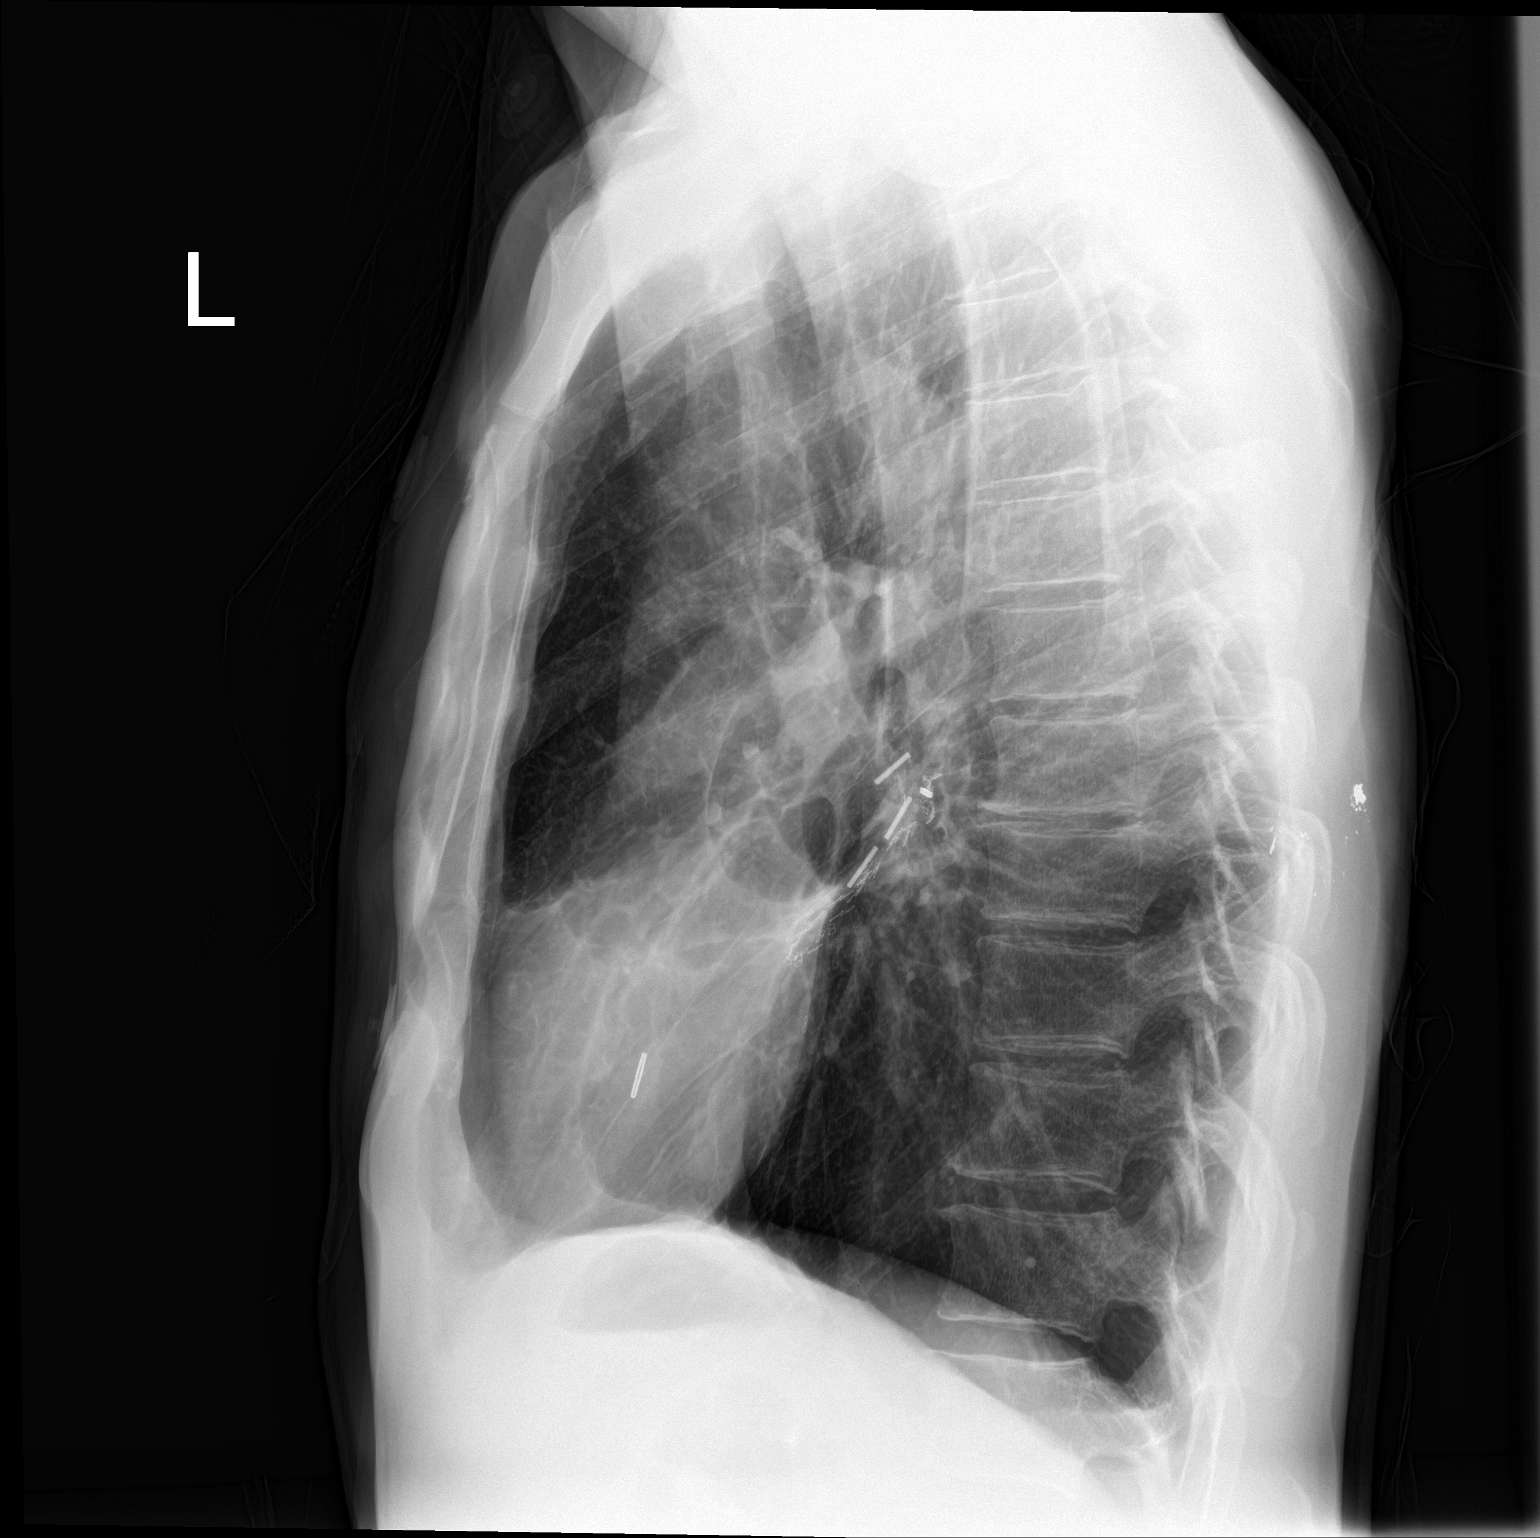

[2 of 2 positions shown; findings below may reference images not displayed]

FINDINGS: Lungs are hyperinflated with emphysema. Postsurgical and
posttraumatic change in the left mid lung with scarring. No
consolidation, pulmonary edema, pleural effusion or pneumothorax.
Osseous structures are stable.
IMPRESSION: 1.  No acute pulmonary process.
2. Stable hyperinflation and postsurgical/posttraumatic change.

## 2016-01-01 NOTE — ED Notes (Signed)
The patient started having chest pain and SOB since yesterday.  He says it got worse today so he came in.  He advises he has COPD, Emphysema, asthma and Bronchitis.  He rates his pain 9/10.  He did use his rescue inhaler but he is out of [place] but he is out. The rescue inhaler is not working anymore.

## 2016-01-02 MED ORDER — ALBUTEROL SULFATE HFA 108 (90 BASE) MCG/ACT IN AERS: 2.0000 | INHALATION_SPRAY | RESPIRATORY_TRACT | Status: DC | PRN

## 2016-01-02 MED ORDER — ALBUTEROL SULFATE HFA 108 (90 BASE) MCG/ACT IN AERS
2.0000 | INHALATION_SPRAY | Freq: Once | RESPIRATORY_TRACT | Status: AC
  Administered 2016-01-02: 2 via RESPIRATORY_TRACT
  Filled 2016-01-02: qty 6.7

## 2016-01-02 MED ORDER — METHYLPREDNISOLONE SODIUM SUCC 125 MG IJ SOLR
125.0000 mg | Freq: Once | INTRAMUSCULAR | Status: AC
  Administered 2016-01-02: 125 mg via INTRAVENOUS
  Filled 2016-01-02: qty 2

## 2016-01-02 MED ORDER — DOXYCYCLINE HYCLATE 100 MG PO CAPS: 100.0000 mg | ORAL_CAPSULE | Freq: Two times a day (BID) | ORAL | Status: DC

## 2016-01-02 MED ORDER — ONDANSETRON HCL 4 MG/2ML IJ SOLN
4.0000 mg | Freq: Once | INTRAMUSCULAR | Status: AC
  Administered 2016-01-02: 4 mg via INTRAVENOUS
  Filled 2016-01-02: qty 2

## 2016-01-02 MED ORDER — PREDNISONE 20 MG PO TABS: 40.0000 mg | ORAL_TABLET | Freq: Every day | ORAL | Status: DC

## 2016-01-02 MED ORDER — IPRATROPIUM-ALBUTEROL 0.5-2.5 (3) MG/3ML IN SOLN
3.0000 mL | Freq: Once | RESPIRATORY_TRACT | Status: AC
  Administered 2016-01-02: 3 mL via RESPIRATORY_TRACT
  Filled 2016-01-02: qty 3

## 2016-01-02 MED FILL — ?ALBUTEROL SUL 2.5 MG/3 MLS: (2.5 MG/3ML | 30 days supply | Qty: 90 | Fill #0

## 2016-01-02 MED FILL — ?DOXYCYCLINE 100 MG TABLET: 100 | 10 days supply | Qty: 20 | Fill #0

## 2016-01-02 MED FILL — predniSONE 20 MG TABS: 20 | 5 days supply | Qty: 10 | Fill #0
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
## 2016-01-02 NOTE — Discharge Instructions (Signed)
Chronic Obstructive Pulmonary Disease Chronic obstructive pulmonary disease (COPD) is a common lung condition in which airflow from the lungs is limited. COPD is a general term that can be used to describe many different lung problems that limit airflow, including both chronic bronchitis and emphysema. If you have COPD, your lung function will probably never return to normal, but there are measures you can take to improve lung function and make yourself feel better. CAUSES   Smoking (common).  Exposure to secondhand smoke.  Genetic problems.  Chronic inflammatory lung diseases or recurrent infections. SYMPTOMS  Shortness of breath, especially with physical activity.  Deep, persistent (chronic) cough with a large amount of thick mucus.  Wheezing.  Rapid breaths (tachypnea).  Gray or bluish discoloration (cyanosis) of the skin, especially in your fingers, toes, or lips.  Fatigue.  Weight loss.  Frequent infections or episodes when breathing symptoms become much worse (exacerbations).  Chest tightness. DIAGNOSIS Your health care provider will take a medical history and perform a physical examination to diagnose COPD. Additional tests for COPD may include:  Lung (pulmonary) function tests.  Chest X-ray.  CT scan.  Blood tests. TREATMENT  Treatment for COPD may include:  Inhaler and nebulizer medicines. These help manage the symptoms of COPD and make your breathing more comfortable.  Supplemental oxygen. Supplemental oxygen is only helpful if you have a low oxygen level in your blood.  Exercise and physical activity. These are beneficial for nearly all people with COPD.  Lung surgery or transplant.  Nutrition therapy to gain weight, if you are underweight.  Pulmonary rehabilitation. This may involve working with a team of health care providers and specialists, such as respiratory, occupational, and physical therapists. HOME CARE INSTRUCTIONS  Take all medicines  (inhaled or pills) as directed by your health care provider.  Avoid over-the-counter medicines or cough syrups that dry up your airway (such as antihistamines) and slow down the elimination of secretions unless instructed otherwise by your health care provider.  If you are a smoker, the most important thing that you can do is stop smoking. Continuing to smoke will cause further lung damage and breathing trouble. Ask your health care provider for help with quitting smoking. He or she can direct you to community resources or hospitals that provide support.  Avoid exposure to irritants such as smoke, chemicals, and fumes that aggravate your breathing.  Use oxygen therapy and pulmonary rehabilitation if directed by your health care provider. If you require home oxygen therapy, ask your health care provider whether you should purchase a pulse oximeter to measure your oxygen level at home.  Avoid contact with individuals who have a contagious illness.  Avoid extreme temperature and humidity changes.  Eat healthy foods. Eating smaller, more frequent meals and resting before meals may help you maintain your strength.  Stay active, but balance activity with periods of rest. Exercise and physical activity will help you maintain your ability to do things you want to do.  Preventing infection and hospitalization is very important when you have COPD. Make sure to receive all the vaccines your health care provider recommends, especially the pneumococcal and influenza vaccines. Ask your health care provider whether you need a pneumonia vaccine.  Learn and use relaxation techniques to manage stress.  Learn and use controlled breathing techniques as directed by your health care provider. Controlled breathing techniques include:  Pursed lip breathing. Start by breathing in (inhaling) through your nose for 1 second. Then, purse your lips as if you were   going to whistle and breathe out (exhale) through the  pursed lips for 2 seconds.  Diaphragmatic breathing. Start by putting one hand on your abdomen just above your waist. Inhale slowly through your nose. The hand on your abdomen should move out. Then purse your lips and exhale slowly. You should be able to feel the hand on your abdomen moving in as you exhale.  Learn and use controlled coughing to clear mucus from your lungs. Controlled coughing is a series of short, progressive coughs. The steps of controlled coughing are: 1. Lean your head slightly forward. 2. Breathe in deeply using diaphragmatic breathing. 3. Try to hold your breath for 3 seconds. 4. Keep your mouth slightly open while coughing twice. 5. Spit any mucus out into a tissue. 6. Rest and repeat the steps once or twice as needed. SEEK MEDICAL CARE IF:  You are coughing up more mucus than usual.  There is a change in the color or thickness of your mucus.  Your breathing is more labored than usual.  Your breathing is faster than usual. SEEK IMMEDIATE MEDICAL CARE IF:  You have shortness of breath while you are resting.  You have shortness of breath that prevents you from:  Being able to talk.  Performing your usual physical activities.  You have chest pain lasting longer than 5 minutes.  Your skin color is more cyanotic than usual.  You measure low oxygen saturations for longer than 5 minutes with a pulse oximeter. MAKE SURE YOU:  Understand these instructions.  Will watch your condition.  Will get help right away if you are not doing well or get worse.   This information is not intended to replace advice given to you by your health care provider. Make sure you discuss any questions you have with your health care provider.   Document Released: 08/08/2005 Document Revised: 11/19/2014 Document Reviewed: 06/25/2013 Elsevier Interactive Patient Education 2016 [employer]  

## 2016-01-02 NOTE — ED Notes (Signed)
Discharge instructions and prescriptions reviewed - voiced understanding 

## 2016-01-09 MED FILL — VENTOLIN HFA 90 MCG INHALER: 108 (90 BAS | 30 days supply | Qty: 18 | Fill #0

## 2016-01-09 MED FILL — $Dulera 100 mcg Inhaler: 100-5 | 60 days supply | Qty: 13 | Fill #5

## 2016-02-17 ENCOUNTER — Ambulatory Visit: Payer: Self-pay | Attending: Family Medicine | Admitting: Family Medicine

## 2016-02-17 ENCOUNTER — Encounter: Payer: Self-pay | Admitting: Family Medicine

## 2016-02-17 ENCOUNTER — Other Ambulatory Visit: Payer: Self-pay | Admitting: Family Medicine

## 2016-02-17 VITALS — BP 124/83 | HR 90 | Temp 98.1°F | Resp 18 | Ht 76.0 in | Wt 151.0 lb

## 2016-02-17 DIAGNOSIS — R1084 Generalized abdominal pain: Secondary | ICD-10-CM | POA: Insufficient documentation

## 2016-02-17 DIAGNOSIS — Z9119 Patient's noncompliance with other medical treatment and regimen: Secondary | ICD-10-CM | POA: Insufficient documentation

## 2016-02-17 DIAGNOSIS — K219 Gastro-esophageal reflux disease without esophagitis: Secondary | ICD-10-CM | POA: Insufficient documentation

## 2016-02-17 DIAGNOSIS — J449 Chronic obstructive pulmonary disease, unspecified: Secondary | ICD-10-CM

## 2016-02-17 DIAGNOSIS — R5383 Other fatigue: Secondary | ICD-10-CM | POA: Insufficient documentation

## 2016-02-17 DIAGNOSIS — J45909 Unspecified asthma, uncomplicated: Secondary | ICD-10-CM | POA: Insufficient documentation

## 2016-02-17 DIAGNOSIS — M25512 Pain in left shoulder: Secondary | ICD-10-CM | POA: Insufficient documentation

## 2016-02-17 DIAGNOSIS — G8929 Other chronic pain: Secondary | ICD-10-CM | POA: Insufficient documentation

## 2016-02-17 DIAGNOSIS — Z79899 Other long term (current) drug therapy: Secondary | ICD-10-CM | POA: Insufficient documentation

## 2016-02-17 DIAGNOSIS — R112 Nausea with vomiting, unspecified: Secondary | ICD-10-CM | POA: Insufficient documentation

## 2016-02-17 LAB — CBC WITH DIFFERENTIAL/PLATELET
BASOS ABS: 0 {cells}/uL (ref 0–200)
BASOS PCT: 0 %
EOS ABS: 83 {cells}/uL (ref 15–500)
Eosinophils Relative: 1 %
HEMATOCRIT: 43.3 % (ref 38.5–50.0)
HEMOGLOBIN: 15 g/dL (ref 13.2–17.1)
LYMPHS PCT: 15 %
Lymphs Abs: 1245 cells/uL (ref 850–3900)
MCH: 31.1 pg (ref 27.0–33.0)
MCHC: 34.6 g/dL (ref 32.0–36.0)
MCV: 89.6 fL (ref 80.0–100.0)
MONOS PCT: 6 %
MPV: 11.1 fL (ref 7.5–12.5)
Monocytes Absolute: 498 cells/uL (ref 200–950)
Neutro Abs: 6474 cells/uL (ref 1500–7800)
Neutrophils Relative %: 78 %
Platelets: 236 10*3/uL (ref 140–400)
RBC: 4.83 MIL/uL (ref 4.20–5.80)
RDW: 13.3 % (ref 11.0–15.0)
WBC: 8.3 10*3/uL (ref 3.8–10.8)

## 2016-02-17 MED ORDER — OMEPRAZOLE 20 MG PO CPDR: 20.0000 mg | DELAYED_RELEASE_CAPSULE | Freq: Every day | ORAL | Status: DC

## 2016-02-17 MED ORDER — ALBUTEROL SULFATE (2.5 MG/3ML) 0.083% IN NEBU: 2.5000 mg | INHALATION_SOLUTION | Freq: Four times a day (QID) | RESPIRATORY_TRACT | Status: DC | PRN

## 2016-02-17 MED ORDER — BUDESONIDE-FORMOTEROL FUMARATE 160-4.5 MCG/ACT IN AERO: 2.0000 | INHALATION_SPRAY | Freq: Two times a day (BID) | RESPIRATORY_TRACT | Status: DC

## 2016-02-17 MED ORDER — ALBUTEROL SULFATE HFA 108 (90 BASE) MCG/ACT IN AERS: 2.0000 | INHALATION_SPRAY | RESPIRATORY_TRACT | Status: DC | PRN

## 2016-02-17 MED ORDER — ACETAMINOPHEN-CODEINE #3 300-30 MG PO TABS: 1.0000 | ORAL_TABLET | Freq: Three times a day (TID) | ORAL | Status: DC | PRN

## 2016-02-17 MED FILL — $PROVENTIL HFA 90 MCG INHAL: 108 (90 BAS | 30 days supply | Qty: 1 | Fill #0

## 2016-02-17 MED FILL — SYMBICORT 160-4.5 MCG INH: 160-4.5 | 30 days supply | Qty: 10 | Fill #0

## 2016-02-17 MED FILL — ?OMEPRAZOLE DR 20 MG CAPSUL: 20 | 30 days supply | Qty: 30 | Fill #0

## 2016-02-17 MED FILL — ACETAMINOPHEN/COD #3 TABLET: 300-30 | 20 days supply | Qty: 60 | Fill #0

## 2016-02-17 MED FILL — ?ALBUTEROL SUL 2.5 MG/3 MLS: (2.5 MG/3ML | 30 days supply | Qty: 90 | Fill #0
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
## 2016-02-17 NOTE — Progress Notes (Signed)
Patient is here to Establish Care.  Patient complains of chronic left shoulder pain and lower back pain. Pain is currently scaled at a 5.   Patient complains of having 24 hour episodes of vomiting and not being able to consume liquid or solids. Patient states last episode occurred 2 weeks ago.  Patient request refills on Inhaler and Prednisone to assist with COPD. Patient states current inhalers provide no relief.  Patient complains of SOB with short distances and talking for long periods of time.

## 2016-02-17 NOTE — Patient Instructions (Signed)
Chronic Obstructive Pulmonary Disease Chronic obstructive pulmonary disease (COPD) is a common lung condition in which airflow from the lungs is limited. COPD is a general term that can be used to describe many different lung problems that limit airflow, including both chronic bronchitis and emphysema. If you have COPD, your lung function will probably never return to normal, but there are measures you can take to improve lung function and make yourself feel better. CAUSES   Smoking (common).  Exposure to secondhand smoke.  Genetic problems.  Chronic inflammatory lung diseases or recurrent infections. SYMPTOMS  Shortness of breath, especially with physical activity.  Deep, persistent (chronic) cough with a large amount of thick mucus.  Wheezing.  Rapid breaths (tachypnea).  Gray or bluish discoloration (cyanosis) of the skin, especially in your fingers, toes, or lips.  Fatigue.  Weight loss.  Frequent infections or episodes when breathing symptoms become much worse (exacerbations).  Chest tightness. DIAGNOSIS Your health care provider will take a medical history and perform a physical examination to diagnose COPD. Additional tests for COPD may include:  Lung (pulmonary) function tests.  Chest X-ray.  CT scan.  Blood tests. TREATMENT  Treatment for COPD may include:  Inhaler and nebulizer medicines. These help manage the symptoms of COPD and make your breathing more comfortable.  Supplemental oxygen. Supplemental oxygen is only helpful if you have a low oxygen level in your blood.  Exercise and physical activity. These are beneficial for nearly all people with COPD.  Lung surgery or transplant.  Nutrition therapy to gain weight, if you are underweight.  Pulmonary rehabilitation. This may involve working with a team of health care providers and specialists, such as respiratory, occupational, and physical therapists. HOME CARE INSTRUCTIONS  Take all medicines  (inhaled or pills) as directed by your health care provider.  Avoid over-the-counter medicines or cough syrups that dry up your airway (such as antihistamines) and slow down the elimination of secretions unless instructed otherwise by your health care provider.  If you are a smoker, the most important thing that you can do is stop smoking. Continuing to smoke will cause further lung damage and breathing trouble. Ask your health care provider for help with quitting smoking. He or she can direct you to community resources or hospitals that provide support.  Avoid exposure to irritants such as smoke, chemicals, and fumes that aggravate your breathing.  Use oxygen therapy and pulmonary rehabilitation if directed by your health care provider. If you require home oxygen therapy, ask your health care provider whether you should purchase a pulse oximeter to measure your oxygen level at home.  Avoid contact with individuals who have a contagious illness.  Avoid extreme temperature and humidity changes.  Eat healthy foods. Eating smaller, more frequent meals and resting before meals may help you maintain your strength.  Stay active, but balance activity with periods of rest. Exercise and physical activity will help you maintain your ability to do things you want to do.  Preventing infection and hospitalization is very important when you have COPD. Make sure to receive all the vaccines your health care provider recommends, especially the pneumococcal and influenza vaccines. Ask your health care provider whether you need a pneumonia vaccine.  Learn and use relaxation techniques to manage stress.  Learn and use controlled breathing techniques as directed by your health care provider. Controlled breathing techniques include:  Pursed lip breathing. Start by breathing in (inhaling) through your nose for 1 second. Then, purse your lips as if you were   going to whistle and breathe out (exhale) through the  pursed lips for 2 seconds.  Diaphragmatic breathing. Start by putting one hand on your abdomen just above your waist. Inhale slowly through your nose. The hand on your abdomen should move out. Then purse your lips and exhale slowly. You should be able to feel the hand on your abdomen moving in as you exhale.  Learn and use controlled coughing to clear mucus from your lungs. Controlled coughing is a series of short, progressive coughs. The steps of controlled coughing are: 1. Lean your head slightly forward. 2. Breathe in deeply using diaphragmatic breathing. 3. Try to hold your breath for 3 seconds. 4. Keep your mouth slightly open while coughing twice. 5. Spit any mucus out into a tissue. 6. Rest and repeat the steps once or twice as needed. SEEK MEDICAL CARE IF:  You are coughing up more mucus than usual.  There is a change in the color or thickness of your mucus.  Your breathing is more labored than usual.  Your breathing is faster than usual. SEEK IMMEDIATE MEDICAL CARE IF:  You have shortness of breath while you are resting.  You have shortness of breath that prevents you from:  Being able to talk.  Performing your usual physical activities.  You have chest pain lasting longer than 5 minutes.  Your skin color is more cyanotic than usual.  You measure low oxygen saturations for longer than 5 minutes with a pulse oximeter. MAKE SURE YOU:  Understand these instructions.  Will watch your condition.  Will get help right away if you are not doing well or get worse.   This information is not intended to replace advice given to you by your health care provider. Make sure you discuss any questions you have with your health care provider.   Document Released: 08/08/2005 Document Revised: 11/19/2014 Document Reviewed: 06/25/2013 Elsevier Interactive Patient Education 2016 [employer]  

## 2016-02-18 LAB — VITAMIN D 25 HYDROXY (VIT D DEFICIENCY, FRACTURES): VIT D 25 HYDROXY: 30 ng/mL (ref 30–100)

## 2016-02-18 LAB — TESTOSTERONE: TESTOSTERONE: 359 ng/dL (ref 250–827)

## 2016-02-23 LAB — TSH: TSH: 0.66 m[IU]/L (ref 0.40–4.50)

## 2016-03-07 ENCOUNTER — Encounter: Payer: Self-pay | Admitting: Family Medicine

## 2016-03-07 ENCOUNTER — Ambulatory Visit: Payer: Self-pay | Attending: Family Medicine | Admitting: Family Medicine

## 2016-03-07 VITALS — BP 132/70 | HR 71 | Temp 98.1°F | Resp 16 | Ht 76.0 in | Wt 159.6 lb

## 2016-03-07 DIAGNOSIS — J302 Other seasonal allergic rhinitis: Secondary | ICD-10-CM

## 2016-03-07 DIAGNOSIS — K219 Gastro-esophageal reflux disease without esophagitis: Secondary | ICD-10-CM

## 2016-03-07 DIAGNOSIS — J449 Chronic obstructive pulmonary disease, unspecified: Secondary | ICD-10-CM

## 2016-03-07 DIAGNOSIS — J45909 Unspecified asthma, uncomplicated: Secondary | ICD-10-CM

## 2016-03-07 DIAGNOSIS — Z79899 Other long term (current) drug therapy: Secondary | ICD-10-CM | POA: Insufficient documentation

## 2016-03-07 MED ORDER — TIOTROPIUM BROMIDE MONOHYDRATE 18 MCG IN CAPS: 18.0000 ug | ORAL_CAPSULE | Freq: Every day | RESPIRATORY_TRACT | Status: DC

## 2016-03-07 MED ORDER — CETIRIZINE HCL 10 MG PO TABS: 10.0000 mg | ORAL_TABLET | Freq: Every day | ORAL | Status: DC

## 2016-03-07 NOTE — Progress Notes (Signed)
Patient's here for f/up COPD, bronchitis, asthma and emphysema.  Patient denies any pain today, but having vomiting spells and difficulties breathing in and out.  Patient requesting med refill on Tylenol #3, Albuterol, Symbicort.

## 2016-03-07 NOTE — Progress Notes (Signed)
Subjective:  Patient ID: Kenneth Mcdowell, male    DOB: 1966/11/05  Age: 50 y.o. MRN: [id number]  CC: Follow-up and COPD   HPI Kenneth Mcdowell presents for a follow-up of his COPD. At his last visit he was placed on Symbicort and reports improvement in pulmonary symptoms but still complains of shortness of breath with moderate exertion and he has to stop to catch his breath. He uses his Proventil every 6 hours as needed and does not smoke  He was placed on omeprazole at his last office visit for vomiting spells and he reports that he has not had any vomiting since his last visit 3 weeks ago.  Outpatient Prescriptions Prior to Visit  Medication Sig Dispense Refill  . acetaminophen-codeine (TYLENOL #3) 300-30 MG tablet Take 1 tablet by mouth every 8 (eight) hours as needed for moderate pain. 60 tablet 1  . albuterol (PROVENTIL HFA;VENTOLIN HFA) 108 (90 Base) MCG/ACT inhaler Inhale 2 puffs into the lungs every 4 (four) hours as needed for wheezing or shortness of breath. 1 Inhaler 3  . albuterol (PROVENTIL) (2.5 MG/3ML) 0.083% nebulizer solution Take 3 mLs (2.5 mg total) by nebulization every 6 (six) hours as needed for wheezing or shortness of breath. 150 mL 2  . budesonide-formoterol (SYMBICORT) 160-4.5 MCG/ACT inhaler Inhale 2 puffs into the lungs 2 (two) times daily. 1 Inhaler 3  . ibuprofen (ADVIL,MOTRIN) 200 MG tablet Take 800 mg by mouth every 6 (six) hours as needed for mild pain or moderate pain.    Marland Kitchen ondansetron (ZOFRAN) 4 MG tablet Take 1 tablet (4 mg total) by mouth every 8 (eight) hours as needed for nausea or vomiting. 20 tablet 0  . acetaminophen (TYLENOL) 500 MG tablet Take 1 tablet (500 mg total) by mouth every 6 (six) hours as needed. (Patient not taking: Reported on 06/17/2015) 30 tablet 0  . omeprazole (PRILOSEC) 20 MG capsule Take 1 capsule (20 mg total) by mouth daily. (Patient not taking: Reported on 03/07/2016) 30 capsule 3   No facility-administered medications prior to  visit.    ROS Review of Systems  Constitutional: Negative for activity change and appetite change.  HENT: Negative for sinus pressure and sore throat.   Respiratory: Positive for shortness of breath (and fatigue and moderate exertion). Negative for chest tightness and wheezing.   Cardiovascular: Negative for chest pain and palpitations.  Gastrointestinal: Negative for abdominal pain, constipation and abdominal distention.  Genitourinary: Negative.   Musculoskeletal: Negative.   Psychiatric/Behavioral: Negative for behavioral problems and dysphoric mood.    Objective:  BP 132/70 mmHg  Pulse 71  Temp(Src) 98.1 F (36.7 C) (Oral)  Resp 16  Ht 6\' 4"  (1.93 m)  Wt 159 lb 9.6 oz (72.394 kg)  BMI 19.44 kg/m2  SpO2 99%  BP/Weight 03/07/2016 02/17/2016 123456  Systolic BP Q000111Q A999333 [id number]  Diastolic BP 70 83 79  Wt. (Lbs) 159.6 151 -  BMI 19.44 18.39 -      Physical Exam  Constitutional: He is oriented to person, place, and time. He appears well-developed and well-nourished.  Cardiovascular: Normal rate, normal heart sounds and intact distal pulses.   No murmur heard. Pulmonary/Chest: Effort normal and breath sounds normal. He has no wheezes. He has no rales. He exhibits no tenderness.  Abdominal: Soft. Bowel sounds are normal. He exhibits no distension and no mass. There is no tenderness.  Musculoskeletal: Normal range of motion.  Neurological: He is alert and oriented to person, place, and time.  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  Dispense: 30 capsule; Refill: 3  2. Seasonal allergies Cannot exclude pollen as underlying trigger of COPD symptoms  Cetirizine (ZYRTEC) 10 MG tablet; Take 1  tablet (10 mg total) by mouth daily.  Dispense: 30 tablet; Refill: 3  3. Gastroesophageal reflux disease without esophagitis vomiting has improved since initiation of omeprazole.    Meds ordered this encounter  Medications  . tiotropium (SPIRIVA HANDIHALER) 18 MCG inhalation capsule    Sig: Place 1 capsule (18 mcg total) into inhaler and inhale daily.    Dispense:  30 capsule    Refill:  3  . cetirizine (ZYRTEC) 10 MG tablet    Sig: Take 1 tablet (10 mg total) by mouth daily.    Dispense:  30 tablet    Refill:  3    Follow-up: Return in about 1 month (around 04/06/2016), or if symptoms worsen or fail to improve, for Follow-up on COPD.   Arnoldo Morale MD

## 2016-03-07 NOTE — Patient Instructions (Signed)
Chronic Obstructive Pulmonary Disease Chronic obstructive pulmonary disease (COPD) is a common lung condition in which airflow from the lungs is limited. COPD is a general term that can be used to describe many different lung problems that limit airflow, including both chronic bronchitis and emphysema. If you have COPD, your lung function will probably never return to normal, but there are measures you can take to improve lung function and make yourself feel better. CAUSES   Smoking (common).  Exposure to secondhand smoke.  Genetic problems.  Chronic inflammatory lung diseases or recurrent infections. SYMPTOMS  Shortness of breath, especially with physical activity.  Deep, persistent (chronic) cough with a large amount of thick mucus.  Wheezing.  Rapid breaths (tachypnea).  Gray or bluish discoloration (cyanosis) of the skin, especially in your fingers, toes, or lips.  Fatigue.  Weight loss.  Frequent infections or episodes when breathing symptoms become much worse (exacerbations).  Chest tightness. DIAGNOSIS Your health care provider will take a medical history and perform a physical examination to diagnose COPD. Additional tests for COPD may include:  Lung (pulmonary) function tests.  Chest X-ray.  CT scan.  Blood tests. TREATMENT  Treatment for COPD may include:  Inhaler and nebulizer medicines. These help manage the symptoms of COPD and make your breathing more comfortable.  Supplemental oxygen. Supplemental oxygen is only helpful if you have a low oxygen level in your blood.  Exercise and physical activity. These are beneficial for nearly all people with COPD.  Lung surgery or transplant.  Nutrition therapy to gain weight, if you are underweight.  Pulmonary rehabilitation. This may involve working with a team of health care providers and specialists, such as respiratory, occupational, and physical therapists. HOME CARE INSTRUCTIONS  Take all medicines  (inhaled or pills) as directed by your health care provider.  Avoid over-the-counter medicines or cough syrups that dry up your airway (such as antihistamines) and slow down the elimination of secretions unless instructed otherwise by your health care provider.  If you are a smoker, the most important thing that you can do is stop smoking. Continuing to smoke will cause further lung damage and breathing trouble. Ask your health care provider for help with quitting smoking. He or she can direct you to community resources or hospitals that provide support.  Avoid exposure to irritants such as smoke, chemicals, and fumes that aggravate your breathing.  Use oxygen therapy and pulmonary rehabilitation if directed by your health care provider. If you require home oxygen therapy, ask your health care provider whether you should purchase a pulse oximeter to measure your oxygen level at home.  Avoid contact with individuals who have a contagious illness.  Avoid extreme temperature and humidity changes.  Eat healthy foods. Eating smaller, more frequent meals and resting before meals may help you maintain your strength.  Stay active, but balance activity with periods of rest. Exercise and physical activity will help you maintain your ability to do things you want to do.  Preventing infection and hospitalization is very important when you have COPD. Make sure to receive all the vaccines your health care provider recommends, especially the pneumococcal and influenza vaccines. Ask your health care provider whether you need a pneumonia vaccine.  Learn and use relaxation techniques to manage stress.  Learn and use controlled breathing techniques as directed by your health care provider. Controlled breathing techniques include:  Pursed lip breathing. Start by breathing in (inhaling) through your nose for 1 second. Then, purse your lips as if you were   going to whistle and breathe out (exhale) through the  pursed lips for 2 seconds.  Diaphragmatic breathing. Start by putting one hand on your abdomen just above your waist. Inhale slowly through your nose. The hand on your abdomen should move out. Then purse your lips and exhale slowly. You should be able to feel the hand on your abdomen moving in as you exhale.  Learn and use controlled coughing to clear mucus from your lungs. Controlled coughing is a series of short, progressive coughs. The steps of controlled coughing are: 1. Lean your head slightly forward. 2. Breathe in deeply using diaphragmatic breathing. 3. Try to hold your breath for 3 seconds. 4. Keep your mouth slightly open while coughing twice. 5. Spit any mucus out into a tissue. 6. Rest and repeat the steps once or twice as needed. SEEK MEDICAL CARE IF:  You are coughing up more mucus than usual.  There is a change in the color or thickness of your mucus.  Your breathing is more labored than usual.  Your breathing is faster than usual. SEEK IMMEDIATE MEDICAL CARE IF:  You have shortness of breath while you are resting.  You have shortness of breath that prevents you from:  Being able to talk.  Performing your usual physical activities.  You have chest pain lasting longer than 5 minutes.  Your skin color is more cyanotic than usual.  You measure low oxygen saturations for longer than 5 minutes with a pulse oximeter. MAKE SURE YOU:  Understand these instructions.  Will watch your condition.  Will get help right away if you are not doing well or get worse.   This information is not intended to replace advice given to you by your health care provider. Make sure you discuss any questions you have with your health care provider.   Document Released: 08/08/2005 Document Revised: 11/19/2014 Document Reviewed: 06/25/2013 Elsevier Interactive Patient Education 2016 [employer]  

## 2016-03-09 ENCOUNTER — Encounter: Payer: Self-pay | Admitting: Clinical

## 2016-03-09 NOTE — Progress Notes (Signed)
Depression screen [facility] 2/9 03/07/2016 06/17/2015  Decreased Interest 0 3  Down, Depressed, Hopeless 3 3  PHQ - 2 Score 3 6  Altered sleeping 3 -  Tired, decreased energy 3 -  Change in appetite 3 -  Feeling bad or failure about yourself  3 -  Trouble concentrating 3 -  Moving slowly or fidgety/restless 3 -  Suicidal thoughts 0 -  PHQ-9 Score 21 -    GAD 7 : Generalized Anxiety Score 03/07/2016  Nervous, Anxious, on Edge 3  Control/stop worrying 3  Worry too much - different things 3  Trouble relaxing 3  Restless 3  Easily annoyed or irritable 3  Afraid - awful might happen 3  Total GAD 7 Score 21

## 2016-03-19 MED FILL — ACETAMINOPHEN/COD #3 TABLET: 300-30 | 20 days supply | Qty: 60 | Fill #1

## 2016-03-19 MED FILL — SPIRIVA 18 MCG CP-HANDIHALE: 18 | 30 days supply | Qty: 30 | Fill #0

## 2016-03-19 MED FILL — SYMBICORT 160-4.5 MCG INH: 160-4.5 | 30 days supply | Qty: 10 | Fill #1

## 2016-03-19 MED FILL — ?ALBUTEROL SUL 2.5 MG/3 MLS: (2.5 MG/3ML | 5 days supply | Qty: 90 | Fill #1

## 2016-03-19 MED FILL — ?OMEPRAZOLE DR 20 MG CAPSUL: 20 | 30 days supply | Qty: 30 | Fill #1

## 2016-03-19 MED FILL — ?CETIRIZINE HCL 10 MG TABLE: 10 | 30 days supply | Qty: 30 | Fill #0

## 2016-03-19 MED FILL — $PROVENTIL HFA 90 MCG INHAL: 108 (90 BAS | 30 days supply | Qty: 1 | Fill #1

## 2016-04-06 ENCOUNTER — Ambulatory Visit: Payer: Self-pay | Admitting: Family Medicine

## 2016-04-16 ENCOUNTER — Encounter: Payer: Self-pay | Admitting: Family Medicine

## 2016-04-16 ENCOUNTER — Ambulatory Visit: Payer: Self-pay | Attending: Family Medicine | Admitting: Family Medicine

## 2016-04-16 VITALS — BP 110/75 | HR 86 | Temp 98.1°F | Resp 18 | Ht 76.0 in | Wt 161.2 lb

## 2016-04-16 DIAGNOSIS — M25512 Pain in left shoulder: Secondary | ICD-10-CM

## 2016-04-16 DIAGNOSIS — J449 Chronic obstructive pulmonary disease, unspecified: Secondary | ICD-10-CM

## 2016-04-16 DIAGNOSIS — J45909 Unspecified asthma, uncomplicated: Secondary | ICD-10-CM

## 2016-04-16 DIAGNOSIS — K219 Gastro-esophageal reflux disease without esophagitis: Secondary | ICD-10-CM

## 2016-04-16 NOTE — Patient Instructions (Signed)
Chronic Obstructive Pulmonary Disease Chronic obstructive pulmonary disease (COPD) is a common lung condition in which airflow from the lungs is limited. COPD is a general term that can be used to describe many different lung problems that limit airflow, including both chronic bronchitis and emphysema. If you have COPD, your lung function will probably never return to normal, but there are measures you can take to improve lung function and make yourself feel better. CAUSES   Smoking (common).  Exposure to secondhand smoke.  Genetic problems.  Chronic inflammatory lung diseases or recurrent infections. SYMPTOMS  Shortness of breath, especially with physical activity.  Deep, persistent (chronic) cough with a large amount of thick mucus.  Wheezing.  Rapid breaths (tachypnea).  Gray or bluish discoloration (cyanosis) of the skin, especially in your fingers, toes, or lips.  Fatigue.  Weight loss.  Frequent infections or episodes when breathing symptoms become much worse (exacerbations).  Chest tightness. DIAGNOSIS Your health care provider will take a medical history and perform a physical examination to diagnose COPD. Additional tests for COPD may include:  Lung (pulmonary) function tests.  Chest X-ray.  CT scan.  Blood tests. TREATMENT  Treatment for COPD may include:  Inhaler and nebulizer medicines. These help manage the symptoms of COPD and make your breathing more comfortable.  Supplemental oxygen. Supplemental oxygen is only helpful if you have a low oxygen level in your blood.  Exercise and physical activity. These are beneficial for nearly all people with COPD.  Lung surgery or transplant.  Nutrition therapy to gain weight, if you are underweight.  Pulmonary rehabilitation. This may involve working with a team of health care providers and specialists, such as respiratory, occupational, and physical therapists. HOME CARE INSTRUCTIONS  Take all medicines  (inhaled or pills) as directed by your health care provider.  Avoid over-the-counter medicines or cough syrups that dry up your airway (such as antihistamines) and slow down the elimination of secretions unless instructed otherwise by your health care provider.  If you are a smoker, the most important thing that you can do is stop smoking. Continuing to smoke will cause further lung damage and breathing trouble. Ask your health care provider for help with quitting smoking. He or she can direct you to community resources or hospitals that provide support.  Avoid exposure to irritants such as smoke, chemicals, and fumes that aggravate your breathing.  Use oxygen therapy and pulmonary rehabilitation if directed by your health care provider. If you require home oxygen therapy, ask your health care provider whether you should purchase a pulse oximeter to measure your oxygen level at home.  Avoid contact with individuals who have a contagious illness.  Avoid extreme temperature and humidity changes.  Eat healthy foods. Eating smaller, more frequent meals and resting before meals may help you maintain your strength.  Stay active, but balance activity with periods of rest. Exercise and physical activity will help you maintain your ability to do things you want to do.  Preventing infection and hospitalization is very important when you have COPD. Make sure to receive all the vaccines your health care provider recommends, especially the pneumococcal and influenza vaccines. Ask your health care provider whether you need a pneumonia vaccine.  Learn and use relaxation techniques to manage stress.  Learn and use controlled breathing techniques as directed by your health care provider. Controlled breathing techniques include:  Pursed lip breathing. Start by breathing in (inhaling) through your nose for 1 second. Then, purse your lips as if you were   going to whistle and breathe out (exhale) through the  pursed lips for 2 seconds.  Diaphragmatic breathing. Start by putting one hand on your abdomen just above your waist. Inhale slowly through your nose. The hand on your abdomen should move out. Then purse your lips and exhale slowly. You should be able to feel the hand on your abdomen moving in as you exhale.  Learn and use controlled coughing to clear mucus from your lungs. Controlled coughing is a series of short, progressive coughs. The steps of controlled coughing are: 1. Lean your head slightly forward. 2. Breathe in deeply using diaphragmatic breathing. 3. Try to hold your breath for 3 seconds. 4. Keep your mouth slightly open while coughing twice. 5. Spit any mucus out into a tissue. 6. Rest and repeat the steps once or twice as needed. SEEK MEDICAL CARE IF:  You are coughing up more mucus than usual.  There is a change in the color or thickness of your mucus.  Your breathing is more labored than usual.  Your breathing is faster than usual. SEEK IMMEDIATE MEDICAL CARE IF:  You have shortness of breath while you are resting.  You have shortness of breath that prevents you from:  Being able to talk.  Performing your usual physical activities.  You have chest pain lasting longer than 5 minutes.  Your skin color is more cyanotic than usual.  You measure low oxygen saturations for longer than 5 minutes with a pulse oximeter. MAKE SURE YOU:  Understand these instructions.  Will watch your condition.  Will get help right away if you are not doing well or get worse.   This information is not intended to replace advice given to you by your health care provider. Make sure you discuss any questions you have with your health care provider.   Document Released: 08/08/2005 Document Revised: 11/19/2014 Document Reviewed: 06/25/2013 Elsevier Interactive Patient Education 2016 [employer]  

## 2016-04-16 NOTE — Progress Notes (Signed)
Patient is here for FU  Patient has not taken medication today. Patient has eaten today.  Patient complains of left shoulder pain being present.  Patient denies any suicidal ideations at this time. Patient did score high on his depression screening.  Patient request refill on Tylenol 3.
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
## 2016-05-03 MED FILL — SYMBICORT 160-4.5 MCG INH: 160-4.5 | 30 days supply | Qty: 10 | Fill #2

## 2016-05-03 MED FILL — ?ALBUTEROL SUL 2.5 MG/3 MLS: (2.5 MG/3ML | 5 days supply | Qty: 90 | Fill #2

## 2016-05-03 MED FILL — SPIRIVA 18 MCG CP-HANDIHALE: 18 | 30 days supply | Qty: 30 | Fill #1

## 2016-05-03 MED FILL — $PROVENTIL HFA 90 MCG INHAL: 108 (90 BAS | 30 days supply | Qty: 1 | Fill #2

## 2016-08-02 ENCOUNTER — Emergency Department (HOSPITAL_COMMUNITY): Payer: Worker's Compensation

## 2016-08-02 ENCOUNTER — Emergency Department (HOSPITAL_COMMUNITY)
Admission: EM | Admit: 2016-08-02 | Discharge: 2016-08-02 | Disposition: A | Discharge status: Home / Self Care | Payer: Worker's Compensation | Attending: Emergency Medicine | Admitting: Emergency Medicine

## 2016-08-02 ENCOUNTER — Encounter (HOSPITAL_COMMUNITY): Payer: Self-pay

## 2016-08-02 DIAGNOSIS — Y939 Activity, unspecified: Secondary | ICD-10-CM | POA: Insufficient documentation

## 2016-08-02 DIAGNOSIS — W230XXA Caught, crushed, jammed, or pinched between moving objects, initial encounter: Secondary | ICD-10-CM | POA: Diagnosis not present

## 2016-08-02 DIAGNOSIS — Z7951 Long term (current) use of inhaled steroids: Secondary | ICD-10-CM | POA: Diagnosis not present

## 2016-08-02 DIAGNOSIS — J45909 Unspecified asthma, uncomplicated: Secondary | ICD-10-CM | POA: Insufficient documentation

## 2016-08-02 DIAGNOSIS — Z79899 Other long term (current) drug therapy: Secondary | ICD-10-CM | POA: Insufficient documentation

## 2016-08-02 DIAGNOSIS — Y999 Unspecified external cause status: Secondary | ICD-10-CM | POA: Insufficient documentation

## 2016-08-02 DIAGNOSIS — S92421A Displaced fracture of distal phalanx of right great toe, initial encounter for closed fracture: Secondary | ICD-10-CM | POA: Insufficient documentation

## 2016-08-02 DIAGNOSIS — F909 Attention-deficit hyperactivity disorder, unspecified type: Secondary | ICD-10-CM | POA: Diagnosis not present

## 2016-08-02 DIAGNOSIS — S97101A Crushing injury of unspecified right toe(s), initial encounter: Secondary | ICD-10-CM

## 2016-08-02 DIAGNOSIS — Y929 Unspecified place or not applicable: Secondary | ICD-10-CM | POA: Insufficient documentation

## 2016-08-02 DIAGNOSIS — Z87891 Personal history of nicotine dependence: Secondary | ICD-10-CM | POA: Insufficient documentation

## 2016-08-02 DIAGNOSIS — J449 Chronic obstructive pulmonary disease, unspecified: Secondary | ICD-10-CM | POA: Diagnosis not present

## 2016-08-02 DIAGNOSIS — S92911A Unspecified fracture of right toe(s), initial encounter for closed fracture: Secondary | ICD-10-CM

## 2016-08-02 DIAGNOSIS — S99921A Unspecified injury of right foot, initial encounter: Secondary | ICD-10-CM | POA: Diagnosis present
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
## 2016-08-02 MED ORDER — HYDROCODONE-ACETAMINOPHEN 5-325 MG PO TABS: 1.0000 | ORAL_TABLET | ORAL | 0 refills | Status: DC | PRN

## 2016-08-02 MED ORDER — HYDROCODONE-ACETAMINOPHEN 5-325 MG PO TABS
1.0000 | ORAL_TABLET | Freq: Once | ORAL | Status: AC
  Administered 2016-08-02: 1 via ORAL
  Filled 2016-08-02: qty 1

## 2016-08-02 NOTE — Discharge Instructions (Signed)
Read the information below.  Use the prescribed medication as directed.  Please discuss all new medications with your pharmacist.  Do not take additional tylenol while taking the prescribed pain medication to avoid overdose.  You may return to the Emergency Department at any time for worsening condition or any new symptoms that concern you.   If you develop uncontrolled pain, weakness or numbness of the extremity, severe discoloration of the skin, return to the ER for a recheck.

## 2016-08-02 NOTE — ED Triage Notes (Signed)
PT C/O LEFT GREAT TOE PAIN AFTER HIS FOOT WAS RUN OVER BY A 15OOLB TABLE DOLLY.
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
## 2016-09-02 ENCOUNTER — Emergency Department (HOSPITAL_COMMUNITY): Payer: Self-pay

## 2016-09-02 ENCOUNTER — Emergency Department (HOSPITAL_COMMUNITY)
Admission: EM | Admit: 2016-09-02 | Discharge: 2016-09-02 | Disposition: A | Discharge status: Home / Self Care | Payer: Self-pay | Attending: Emergency Medicine | Admitting: Emergency Medicine

## 2016-09-02 ENCOUNTER — Encounter (HOSPITAL_COMMUNITY): Payer: Self-pay | Admitting: Emergency Medicine

## 2016-09-02 DIAGNOSIS — F1410 Cocaine abuse, uncomplicated: Secondary | ICD-10-CM | POA: Insufficient documentation

## 2016-09-02 DIAGNOSIS — J449 Chronic obstructive pulmonary disease, unspecified: Secondary | ICD-10-CM | POA: Insufficient documentation

## 2016-09-02 DIAGNOSIS — R079 Chest pain, unspecified: Secondary | ICD-10-CM | POA: Insufficient documentation

## 2016-09-02 DIAGNOSIS — Z87891 Personal history of nicotine dependence: Secondary | ICD-10-CM | POA: Insufficient documentation

## 2016-09-02 DIAGNOSIS — F909 Attention-deficit hyperactivity disorder, unspecified type: Secondary | ICD-10-CM | POA: Insufficient documentation

## 2016-09-02 DIAGNOSIS — Z79899 Other long term (current) drug therapy: Secondary | ICD-10-CM | POA: Insufficient documentation

## 2016-09-02 LAB — BASIC METABOLIC PANEL
ANION GAP: 8 (ref 5–15)
BUN: 7 mg/dL (ref 6–20)
CALCIUM: 8.9 mg/dL (ref 8.9–10.3)
CO2: 24 mmol/L (ref 22–32)
Chloride: 107 mmol/L (ref 101–111)
Creatinine, Ser: 0.93 mg/dL (ref 0.61–1.24)
GFR calc Af Amer: >60 mL/min (ref >60)
GFR calc non Af Amer: >60 mL/min (ref >60)
GLUCOSE: 145 mg/dL — AB (ref 65–99)
Potassium: 3.4 mmol/L — ABNORMAL LOW (ref 3.5–5.1)
Sodium: 139 mmol/L (ref 135–145)

## 2016-09-02 LAB — RAPID URINE DRUG SCREEN, HOSP PERFORMED
AMPHETAMINES: NOT DETECTED
BARBITURATES: NOT DETECTED
BENZODIAZEPINES: NOT DETECTED
Cocaine: POSITIVE — AB
Opiates: NOT DETECTED
Tetrahydrocannabinol: POSITIVE — AB

## 2016-09-02 LAB — CBC
HCT: 37.6 % — ABNORMAL LOW (ref 39.0–52.0)
HEMOGLOBIN: 13 g/dL (ref 13.0–17.0)
MCH: 30.8 pg (ref 26.0–34.0)
MCHC: 34.6 g/dL (ref 30.0–36.0)
MCV: 89.1 fL (ref 78.0–100.0)
Platelets: 217 10*3/uL (ref 150–400)
RBC: 4.22 MIL/uL (ref 4.22–5.81)
RDW: 12.4 % (ref 11.5–15.5)
WBC: 8.7 10*3/uL (ref 4.0–10.5)

## 2016-09-02 LAB — I-STAT TROPONIN, ED
TROPONIN I, POC: 0 ng/mL (ref 0.00–0.08)
TROPONIN I, POC: 0.01 ng/mL (ref 0.00–0.08)
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
## 2016-09-02 MED ORDER — ASPIRIN 81 MG PO CHEW
324.0000 mg | CHEWABLE_TABLET | Freq: Once | ORAL | Status: AC
  Administered 2016-09-02: 324 mg via ORAL
  Filled 2016-09-02: qty 4

## 2016-09-02 MED ORDER — LORAZEPAM 2 MG/ML IJ SOLN
1.0000 mg | Freq: Once | INTRAMUSCULAR | Status: AC
  Administered 2016-09-02: 1 mg via INTRAVENOUS
  Filled 2016-09-02: qty 1

## 2016-09-02 MED ORDER — NITROGLYCERIN 2 % TD OINT
1.0000 [in_us] | TOPICAL_OINTMENT | Freq: Once | TRANSDERMAL | Status: AC
  Administered 2016-09-02: 1 [in_us] via TOPICAL
  Filled 2016-09-02: qty 1

## 2016-09-02 NOTE — Discharge Instructions (Signed)
You were seen today for chest pain. This is likely related to your cocaine use. You need to abstain from cocaine use. Your workup is reassuring. Follow-up with cardiology for stress testing

## 2016-09-02 NOTE — ED Notes (Signed)
Ambulated pt. Pt unsteady during ambulation.
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
## 2016-09-02 NOTE — ED Triage Notes (Signed)
Patient here with chest pain, shortness of breath and nausea after cocaine use.  EMS states that patient was very anxious upon their arrival, HR in 140's and hypertensive.  Patient is now calmer upon arrival to ED.

## 2016-11-16 ENCOUNTER — Emergency Department (HOSPITAL_COMMUNITY): Payer: Self-pay

## 2016-11-16 ENCOUNTER — Emergency Department (HOSPITAL_COMMUNITY)
Admission: EM | Admit: 2016-11-16 | Discharge: 2016-11-16 | Disposition: A | Discharge status: Home / Self Care | Payer: Self-pay | Attending: Emergency Medicine | Admitting: Emergency Medicine

## 2016-11-16 ENCOUNTER — Encounter (HOSPITAL_COMMUNITY): Payer: Self-pay | Admitting: *Deleted

## 2016-11-16 DIAGNOSIS — F909 Attention-deficit hyperactivity disorder, unspecified type: Secondary | ICD-10-CM | POA: Insufficient documentation

## 2016-11-16 DIAGNOSIS — Z87891 Personal history of nicotine dependence: Secondary | ICD-10-CM | POA: Insufficient documentation

## 2016-11-16 DIAGNOSIS — J441 Chronic obstructive pulmonary disease with (acute) exacerbation: Secondary | ICD-10-CM | POA: Insufficient documentation

## 2016-11-16 LAB — CBC WITH DIFFERENTIAL/PLATELET
BASOS PCT: 0 %
Basophils Absolute: 0 10*3/uL (ref 0.0–0.1)
Eosinophils Absolute: 0.2 10*3/uL (ref 0.0–0.7)
Eosinophils Relative: 3 %
HEMATOCRIT: 42.2 % (ref 39.0–52.0)
HEMOGLOBIN: 14.6 g/dL (ref 13.0–17.0)
LYMPHS ABS: 1.2 10*3/uL (ref 0.7–4.0)
LYMPHS PCT: 18 %
MCH: 30.9 pg (ref 26.0–34.0)
MCHC: 34.6 g/dL (ref 30.0–36.0)
MCV: 89.4 fL (ref 78.0–100.0)
MONO ABS: 0.5 10*3/uL (ref 0.1–1.0)
MONOS PCT: 7 %
NEUTROS ABS: 4.7 10*3/uL (ref 1.7–7.7)
NEUTROS PCT: 72 %
Platelets: 241 10*3/uL (ref 150–400)
RBC: 4.72 MIL/uL (ref 4.22–5.81)
RDW: 12.5 % (ref 11.5–15.5)
WBC: 6.6 10*3/uL (ref 4.0–10.5)

## 2016-11-16 LAB — BASIC METABOLIC PANEL
ANION GAP: 11 (ref 5–15)
BUN: 5 mg/dL — AB (ref 6–20)
CHLORIDE: 103 mmol/L (ref 101–111)
CO2: 25 mmol/L (ref 22–32)
Calcium: 9.6 mg/dL (ref 8.9–10.3)
Creatinine, Ser: 0.94 mg/dL (ref 0.61–1.24)
GFR calc Af Amer: >60 mL/min (ref >60)
GLUCOSE: 99 mg/dL (ref 65–99)
POTASSIUM: 4.2 mmol/L (ref 3.5–5.1)
Sodium: 139 mmol/L (ref 135–145)

## 2016-11-16 LAB — I-STAT TROPONIN, ED: Troponin i, poc: 0 ng/mL (ref 0.00–0.08)
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
## 2016-11-16 MED ORDER — ALBUTEROL SULFATE HFA 108 (90 BASE) MCG/ACT IN AERS
2.0000 | INHALATION_SPRAY | Freq: Once | RESPIRATORY_TRACT | Status: AC
  Administered 2016-11-16: 2 via RESPIRATORY_TRACT
  Filled 2016-11-16: qty 6.7

## 2016-11-16 MED ORDER — PREDNISONE 20 MG PO TABS
60.0000 mg | ORAL_TABLET | Freq: Once | ORAL | Status: AC
  Administered 2016-11-16: 60 mg via ORAL
  Filled 2016-11-16: qty 3

## 2016-11-16 MED ORDER — PREDNISONE 20 MG PO TABS: 40.0000 mg | ORAL_TABLET | Freq: Every day | ORAL | 0 refills | Status: DC

## 2016-11-16 MED ORDER — IPRATROPIUM-ALBUTEROL 0.5-2.5 (3) MG/3ML IN SOLN
3.0000 mL | Freq: Once | RESPIRATORY_TRACT | Status: AC
  Administered 2016-11-16: 3 mL via RESPIRATORY_TRACT
  Filled 2016-11-16: qty 3

## 2016-11-16 MED ORDER — ALBUTEROL SULFATE (2.5 MG/3ML) 0.083% IN NEBU: 2.5000 mg | INHALATION_SOLUTION | Freq: Four times a day (QID) | RESPIRATORY_TRACT | 12 refills | Status: DC | PRN

## 2016-11-16 NOTE — ED Triage Notes (Signed)
To ED for eval of sob and cp for past cple of days. States he is out of inhaler. Productive cough. Unknown fevers, but c/o chills. Sounds winded on interview.

## 2016-11-16 NOTE — Discharge Instructions (Signed)
Use inhaler or breathing treatment every 4 hours. Take prednisone as prescribed until gone. Please follow-up as needed. Return if worsening.
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
## 2016-11-20 ENCOUNTER — Encounter: Payer: Self-pay | Admitting: Family Medicine

## 2016-11-20 ENCOUNTER — Ambulatory Visit: Payer: Self-pay | Attending: Family Medicine | Admitting: Family Medicine

## 2016-11-20 ENCOUNTER — Encounter: Payer: Self-pay | Admitting: Licensed Clinical Social Worker

## 2016-11-20 VITALS — BP 130/80 | HR 78 | Temp 98.2°F | Ht 76.0 in | Wt 157.4 lb

## 2016-11-20 DIAGNOSIS — K219 Gastro-esophageal reflux disease without esophagitis: Secondary | ICD-10-CM | POA: Insufficient documentation

## 2016-11-20 DIAGNOSIS — Z7951 Long term (current) use of inhaled steroids: Secondary | ICD-10-CM | POA: Insufficient documentation

## 2016-11-20 DIAGNOSIS — J441 Chronic obstructive pulmonary disease with (acute) exacerbation: Secondary | ICD-10-CM | POA: Insufficient documentation

## 2016-11-20 DIAGNOSIS — J449 Chronic obstructive pulmonary disease, unspecified: Secondary | ICD-10-CM

## 2016-11-20 DIAGNOSIS — F319 Bipolar disorder, unspecified: Secondary | ICD-10-CM | POA: Insufficient documentation

## 2016-11-20 DIAGNOSIS — J320 Chronic maxillary sinusitis: Secondary | ICD-10-CM | POA: Insufficient documentation

## 2016-11-20 DIAGNOSIS — F4323 Adjustment disorder with mixed anxiety and depressed mood: Secondary | ICD-10-CM | POA: Insufficient documentation

## 2016-11-20 DIAGNOSIS — G8929 Other chronic pain: Secondary | ICD-10-CM | POA: Insufficient documentation

## 2016-11-20 DIAGNOSIS — F909 Attention-deficit hyperactivity disorder, unspecified type: Secondary | ICD-10-CM | POA: Insufficient documentation

## 2016-11-20 DIAGNOSIS — Z79899 Other long term (current) drug therapy: Secondary | ICD-10-CM | POA: Insufficient documentation

## 2016-11-20 MED ORDER — ALBUTEROL SULFATE (2.5 MG/3ML) 0.083% IN NEBU: 2.5000 mg | INHALATION_SOLUTION | Freq: Four times a day (QID) | RESPIRATORY_TRACT | 2 refills | Status: DC | PRN

## 2016-11-20 MED ORDER — BUDESONIDE-FORMOTEROL FUMARATE 160-4.5 MCG/ACT IN AERO: 2.0000 | INHALATION_SPRAY | Freq: Two times a day (BID) | RESPIRATORY_TRACT | 3 refills | Status: DC

## 2016-11-20 MED ORDER — FLUOXETINE HCL 20 MG PO TABS: 20.0000 mg | ORAL_TABLET | Freq: Every day | ORAL | 3 refills | Status: DC

## 2016-11-20 MED ORDER — CETIRIZINE HCL 10 MG PO TABS: 10.0000 mg | ORAL_TABLET | Freq: Every day | ORAL | 3 refills | Status: DC

## 2016-11-20 MED ORDER — ALBUTEROL SULFATE HFA 108 (90 BASE) MCG/ACT IN AERS: 2.0000 | INHALATION_SPRAY | RESPIRATORY_TRACT | 3 refills | Status: DC | PRN

## 2016-11-20 MED ORDER — TIOTROPIUM BROMIDE MONOHYDRATE 18 MCG IN CAPS: 18.0000 ug | ORAL_CAPSULE | Freq: Every day | RESPIRATORY_TRACT | 3 refills | Status: DC

## 2016-11-20 MED FILL — ?ALBUTEROL SUL 2.5 MG/3 MLS: (2.5 MG/3ML | 5 days supply | Qty: 90 | Fill #3

## 2016-11-20 MED FILL — SPIRIVA 18 MCG CP-HANDIHALE: 18 | 30 days supply | Qty: 30 | Fill #0

## 2016-11-20 MED FILL — ?OMEPRAZOLE DR 20 MG CAPSUL: 20 | 30 days supply | Qty: 30 | Fill #2

## 2016-11-20 MED FILL — ?CETIRIZINE HCL 10 MG TABLE: 10 | 30 days supply | Qty: 30 | Fill #1

## 2016-11-20 MED FILL — VENTOLIN HFA 90 MCG INHALER: 108 (90 BAS | 25 days supply | Qty: 18 | Fill #0

## 2016-11-20 MED FILL — ?FLUOXETINE HCL 20MG TABLET: 20 | 30 days supply | Qty: 30 | Fill #0
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
## 2016-11-20 NOTE — BH Specialist Note (Signed)
Session Start time: 11:00 AM   End Time: 11:20 AM Total Time:  20 minutes Type of Service: [place]: No.   Interpreter Name & Language: N/A # [facility] Visits July 2017-June 2018: 1st   SUBJECTIVE: Kenneth Mcdowell is a 51 y.o. male  Pt. was referred by Dr. Jarold Song for:  anxiety, depression and food insecurity. Pt. reports the following symptoms/concerns: low energy, low motivation, decreased appetite, difficulty sleeping, and irritability Duration of problem:  "few years" Severity: severe Previous treatment: "I refuse to take meds, they wipe me out"   OBJECTIVE: Mood: Anxious and Irritable & Affect: Depressed and Tearful Risk of harm to self or others: Pt denied SI/HI Assessments administered: PHQ-9; GAD-7  LIFE CONTEXT:  Family & Social: Pt resides with his mother. He has been separated from his wife approx 11 years. Pt has strained relationship with his adult siblings (brother and step-brother) School/ Work: Pt was fired from long time job December 2017. He does not receive any public benefits noting "I have my pride" Mother provides financial support Self-Care: Pt reported that he has been a "recovering alcoholic" for over twenty years. He smokes marijuana "when I can afford it" Life changes: Pt is unemployed What is important to pt/family (values): Family   GOALS ADDRESSED:  Decrease symptoms of depression  Decrease symptoms of anxiety  INTERVENTIONS: Solution Focused, Strength-based and Supportive   ASSESSMENT:  Pt currently experiencing depression and anxiety triggered by physical health and recent job loss. He reports low energy, low motivation, decreased appetite, difficulty sleeping, and irritability. Pt has limited support.  Pt may benefit from psychoeducation, psychotherapy, and medication management. [place] educated pt on how psychosocial stressors can negatively impact physical and mental health. LCSWA discussed importance of  implementing healthy coping skills to decrease symptoms. Pt is not receptive to initiating behavioral health services or referrals to community services at this time. LCSWA encouraged pt to schedule an appointment with Financial Counseling to assist with financial strain. Pt was provided with a food box and information on [employer] to assist with food insecurity.      PLAN: 1. F/U with behavioral health clinician: Pt was encouraged to contact [place] if symptoms worsen or fail to improve to schedule behavioral appointments at [facility]. 2. Behavioral Health meds: None reported 3. Behavioral recommendations: LCSWA recommends that pt apply healthy coping skills discussed. Pt is encouraged to schedule follow up appointment with LCSWA 4. Referral: Brief Counseling/Psychotherapy, [employer], Psychoeducation and Supportive Counseling 5. From scale of 1-10, how likely are you to follow plan: [place], MSW, [place] Worker 11/21/16 10:20 AM  Warmhandoff:   Warm Hand Off Completed.

## 2016-11-20 NOTE — Progress Notes (Signed)
Medication refills

## 2017-01-05 ENCOUNTER — Encounter (HOSPITAL_COMMUNITY): Payer: Self-pay

## 2017-01-05 ENCOUNTER — Emergency Department (HOSPITAL_COMMUNITY): Payer: Self-pay

## 2017-01-05 ENCOUNTER — Emergency Department (HOSPITAL_COMMUNITY)
Admission: EM | Admit: 2017-01-05 | Discharge: 2017-01-05 | Disposition: A | Discharge status: Home / Self Care | Payer: Self-pay | Attending: Emergency Medicine | Admitting: Emergency Medicine

## 2017-01-05 DIAGNOSIS — Z79899 Other long term (current) drug therapy: Secondary | ICD-10-CM | POA: Insufficient documentation

## 2017-01-05 DIAGNOSIS — R791 Abnormal coagulation profile: Secondary | ICD-10-CM | POA: Insufficient documentation

## 2017-01-05 DIAGNOSIS — F909 Attention-deficit hyperactivity disorder, unspecified type: Secondary | ICD-10-CM | POA: Insufficient documentation

## 2017-01-05 DIAGNOSIS — Z87891 Personal history of nicotine dependence: Secondary | ICD-10-CM | POA: Insufficient documentation

## 2017-01-05 DIAGNOSIS — F430 Acute stress reaction: Secondary | ICD-10-CM | POA: Insufficient documentation

## 2017-01-05 DIAGNOSIS — J449 Chronic obstructive pulmonary disease, unspecified: Secondary | ICD-10-CM | POA: Insufficient documentation

## 2017-01-05 DIAGNOSIS — R55 Syncope and collapse: Secondary | ICD-10-CM | POA: Insufficient documentation

## 2017-01-05 LAB — URINALYSIS, ROUTINE W REFLEX MICROSCOPIC
BILIRUBIN URINE: NEGATIVE
GLUCOSE, UA: NEGATIVE mg/dL
HGB URINE DIPSTICK: NEGATIVE
Ketones, ur: NEGATIVE mg/dL
Leukocytes, UA: NEGATIVE
Nitrite: NEGATIVE
Protein, ur: NEGATIVE mg/dL
Specific Gravity, Urine: 1.008 (ref 1.005–1.030)
pH: 6 (ref 5.0–8.0)

## 2017-01-05 LAB — I-STAT CHEM 8, ED
BUN: 7 mg/dL (ref 6–20)
CHLORIDE: 105 mmol/L (ref 101–111)
Calcium, Ion: 1.13 mmol/L — ABNORMAL LOW (ref 1.15–1.40)
Creatinine, Ser: 0.9 mg/dL (ref 0.61–1.24)
GLUCOSE: 94 mg/dL (ref 65–99)
HEMATOCRIT: 42 % (ref 39.0–52.0)
Hemoglobin: 14.3 g/dL (ref 13.0–17.0)
POTASSIUM: 4.4 mmol/L (ref 3.5–5.1)
Sodium: 138 mmol/L (ref 135–145)
TCO2: 25 mmol/L (ref 0–100)

## 2017-01-05 LAB — COMPREHENSIVE METABOLIC PANEL
ALT: 14 U/L — AB (ref 17–63)
AST: 17 U/L (ref 15–41)
Albumin: 4.3 g/dL (ref 3.5–5.0)
Alkaline Phosphatase: 42 U/L (ref 38–126)
Anion gap: 9 (ref 5–15)
BUN: 7 mg/dL (ref 6–20)
CO2: 23 mmol/L (ref 22–32)
CREATININE: 0.94 mg/dL (ref 0.61–1.24)
Calcium: 9.3 mg/dL (ref 8.9–10.3)
Chloride: 106 mmol/L (ref 101–111)
GFR calc Af Amer: >60 mL/min (ref >60)
GFR calc non Af Amer: >60 mL/min (ref >60)
Glucose, Bld: 93 mg/dL (ref 65–99)
POTASSIUM: 4.6 mmol/L (ref 3.5–5.1)
SODIUM: 138 mmol/L (ref 135–145)
Total Bilirubin: 0.5 mg/dL (ref 0.3–1.2)
Total Protein: 6.5 g/dL (ref 6.5–8.1)

## 2017-01-05 LAB — CBC
HCT: 41.7 % (ref 39.0–52.0)
Hemoglobin: 14.3 g/dL (ref 13.0–17.0)
MCH: 31.2 pg (ref 26.0–34.0)
MCHC: 34.3 g/dL (ref 30.0–36.0)
MCV: 90.8 fL (ref 78.0–100.0)
PLATELETS: 187 10*3/uL (ref 150–400)
RBC: 4.59 MIL/uL (ref 4.22–5.81)
RDW: 12.7 % (ref 11.5–15.5)
WBC: 5.8 10*3/uL (ref 4.0–10.5)

## 2017-01-05 LAB — CBG MONITORING, ED: Glucose-Capillary: 91 mg/dL (ref 65–99)

## 2017-01-05 LAB — RAPID URINE DRUG SCREEN, HOSP PERFORMED
AMPHETAMINES: NOT DETECTED
BARBITURATES: NOT DETECTED
BENZODIAZEPINES: POSITIVE — AB
COCAINE: NOT DETECTED
Opiates: NOT DETECTED
Tetrahydrocannabinol: POSITIVE — AB

## 2017-01-05 LAB — PROTIME-INR
INR: 1.02
PROTHROMBIN TIME: 13.4 s (ref 11.4–15.2)

## 2017-01-05 LAB — APTT: APTT: 29 s (ref 24–36)

## 2017-01-05 LAB — ETHANOL

## 2017-01-05 LAB — DIFFERENTIAL
BASOS ABS: 0 10*3/uL (ref 0.0–0.1)
BASOS PCT: 0 %
EOS ABS: 0.2 10*3/uL (ref 0.0–0.7)
Eosinophils Relative: 3 %
Lymphocytes Relative: 22 %
Lymphs Abs: 1.3 10*3/uL (ref 0.7–4.0)
Monocytes Absolute: 0.4 10*3/uL (ref 0.1–1.0)
Monocytes Relative: 7 %
NEUTROS PCT: 68 %
Neutro Abs: 3.9 10*3/uL (ref 1.7–7.7)

## 2017-01-05 LAB — I-STAT TROPONIN, ED: Troponin i, poc: 0 ng/mL (ref 0.00–0.08)

## 2017-01-05 IMAGING — CT CT HEAD W/O CM
3 of 4 series · 17 of 47 positions shown, 20 images · non-contrast
Comparison: [DATE] CT

CLINICAL DATA: 50-year-old male with syncope and altered mental
status.

EXAM:
CT HEAD WITHOUT CONTRAST
TECHNIQUE: Contiguous axial images were obtained from the base of the skull
through the vertex without intravenous contrast.

[Series 201: head w/o, idose (1) · axial · non-contrast · 0.45mm/px · z∈[+150,+280]mm · 11 of 32 slices shown, 14 images]
[im 3/32  brain]
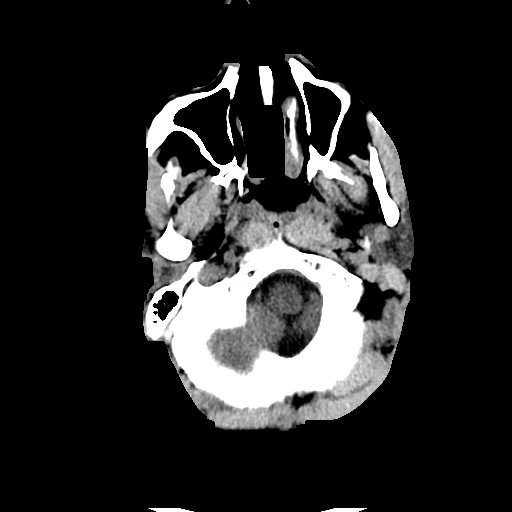
[im 3/32  bone]
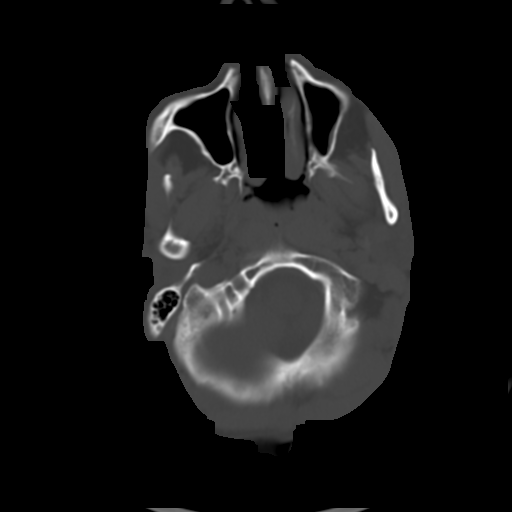
[im 5/32  brain]
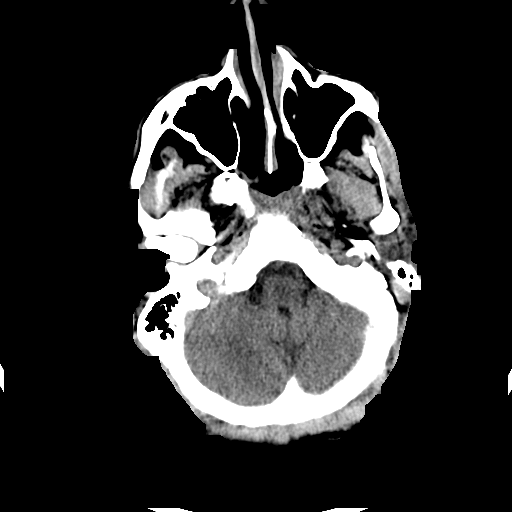
[im 7/32  brain]
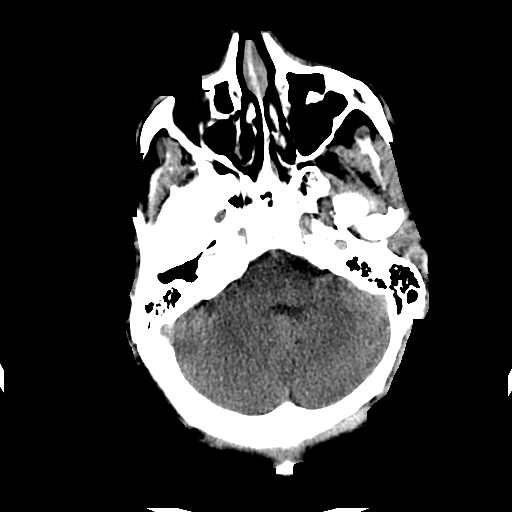
[im 12/32  brain]
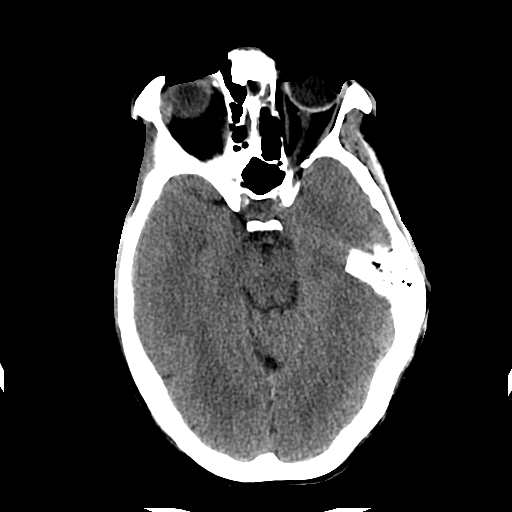
[im 14/32  brain]
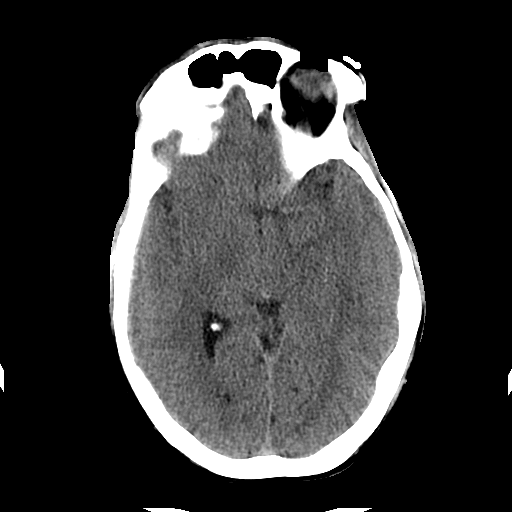
[im 14/32  bone]
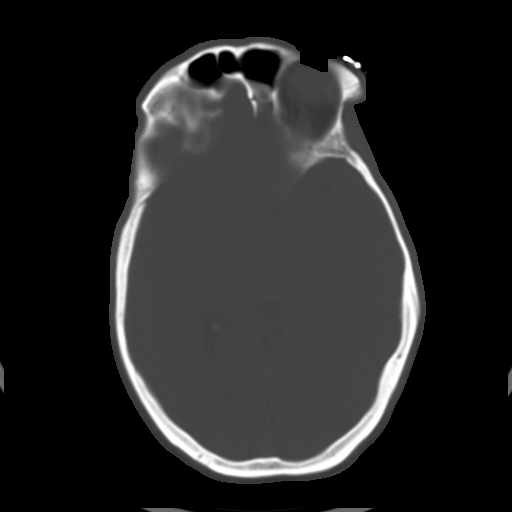
[im 16/32  brain]
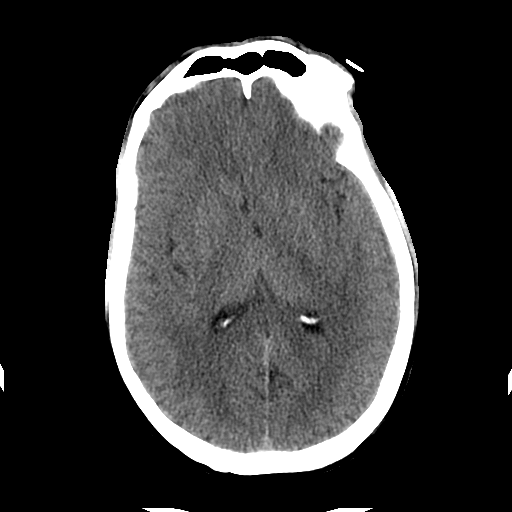
[im 18/32  brain]
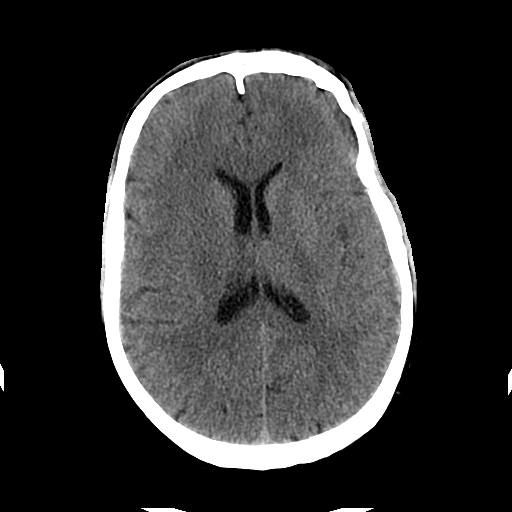
[im 20/32  brain]
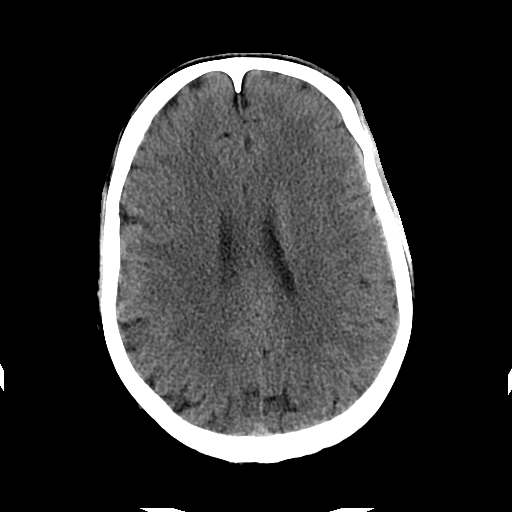
[im 25/32  brain]
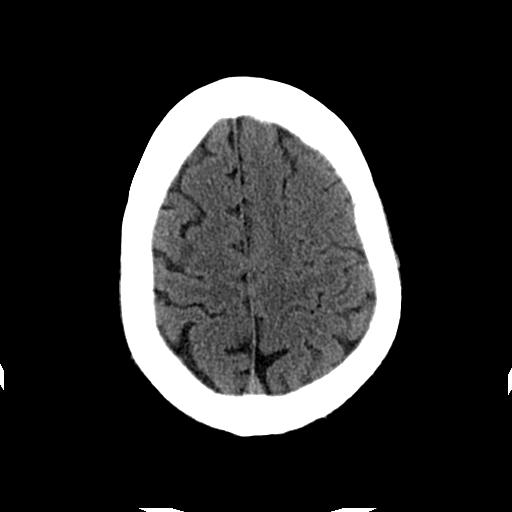
[im 25/32  bone]
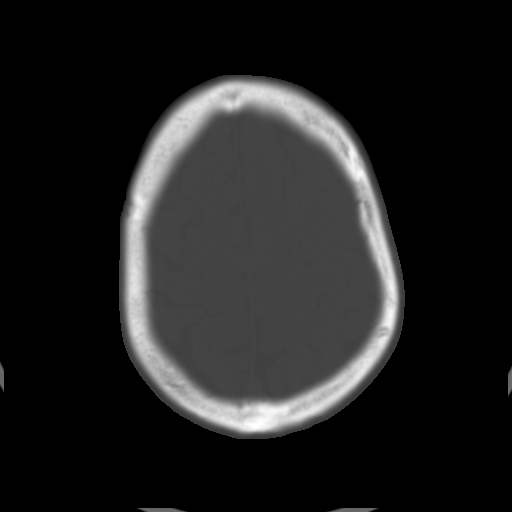
[im 27/32  brain]
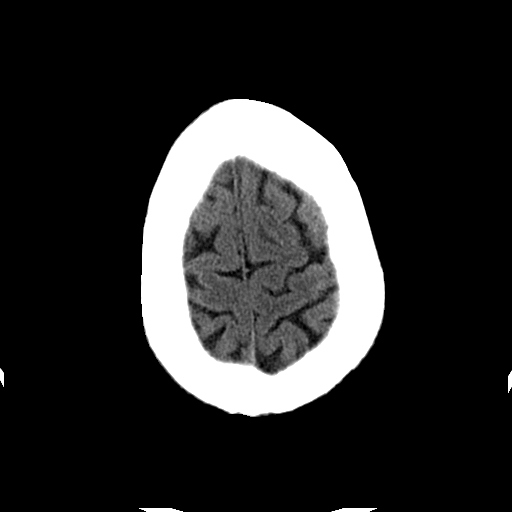
[im 29/32  brain]
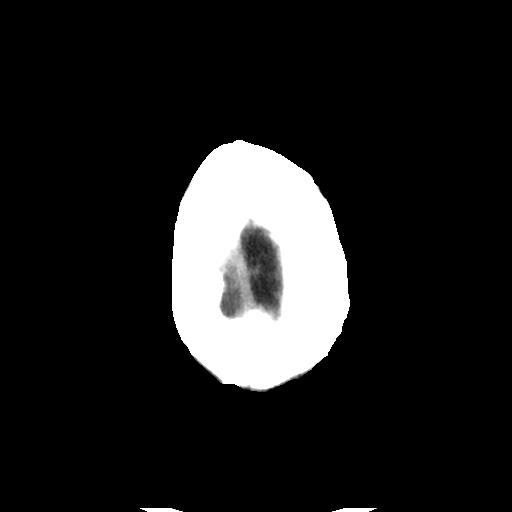

[Series 203: coronal st, idose (1) · coronal · 0.40mm/px · 3 of 76 slices shown]
[im 26/76  brain]
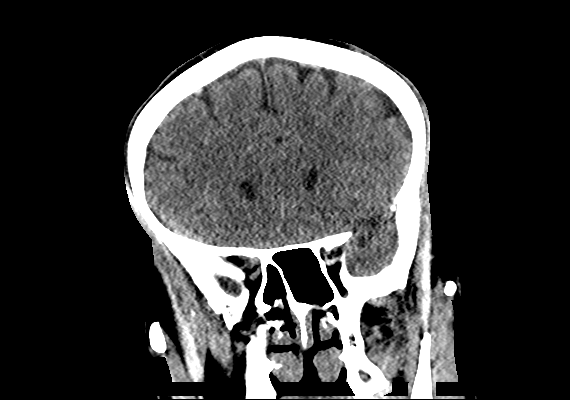
[im 34/76  brain]
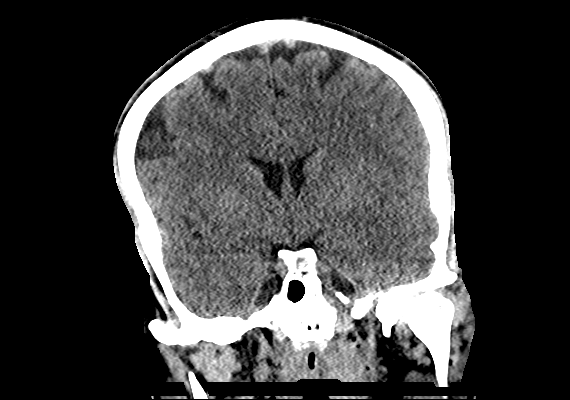
[im 42/76  brain]
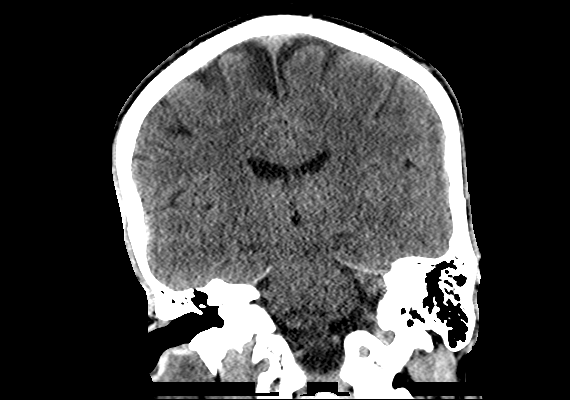

[Series 204: sagittal st, idose (1) · sagittal · 0.40mm/px · 3 of 76 slices shown]
[im 26/76  brain]
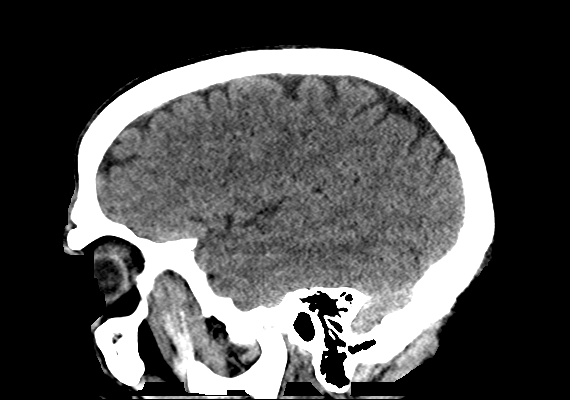
[im 38/76  brain]
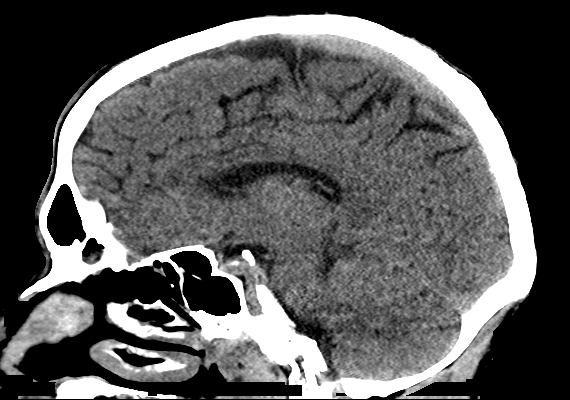
[im 51/76  brain]
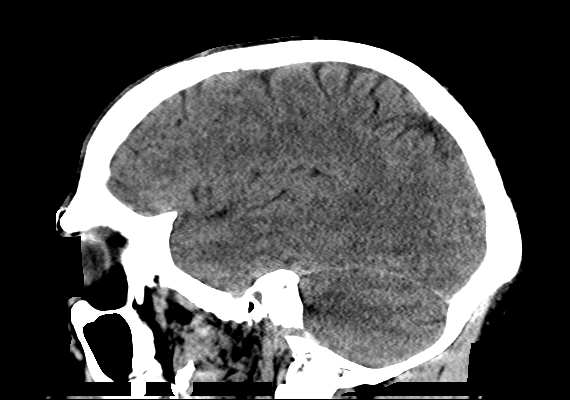

[17 of 47 positions shown; findings below may reference images not displayed]

FINDINGS: Brain: No evidence of acute infarction, hemorrhage, hydrocephalus,
extra-axial collection or mass lesion/mass effect.

Vascular: No hyperdense vessel or unexpected calcification.

Skull: Normal. Negative for fracture or focal lesion.

Sinuses/Orbits: No acute finding.

Other: None.
IMPRESSION: Unremarkable noncontrast head CT.
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
## 2017-01-05 MED ORDER — LORAZEPAM 2 MG/ML IJ SOLN
INTRAMUSCULAR | Status: AC
  Filled 2017-01-05: qty 1

## 2017-01-05 MED ORDER — NALOXONE HCL 2 MG/2ML IJ SOSY
0.4000 mg | PREFILLED_SYRINGE | Freq: Once | INTRAMUSCULAR | Status: AC
  Administered 2017-01-05: 0.4 mg via INTRAVENOUS

## 2017-01-05 MED ORDER — SODIUM CHLORIDE 0.9 % IV BOLUS (SEPSIS)
1000.0000 mL | Freq: Once | INTRAVENOUS | Status: AC
  Administered 2017-01-05: 1000 mL via INTRAVENOUS

## 2017-01-05 NOTE — ED Triage Notes (Addendum)
[facility]- pt from work, unresponsive working in [employer]. Pt has been in and out of consciousness with EMS. Resp effort WNL throughout transport on NRB. 138/84, CBG 99, SPO2 100% NRB mask, 12resp

## 2017-01-05 NOTE — ED Notes (Signed)
Pt had approximately 30sec of shaking activity. Pt came out of the shaking alert and oriented. MD to bedside to assess the patient.
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
## 2017-01-07 MED FILL — VENTOLIN HFA 90 MCG INHALER: 108 (90 BAS | 16 days supply | Qty: 18 | Fill #1

## 2017-01-07 MED FILL — ?OMEPRAZOLE DR 20 MG CAPSUL: 20 | 30 days supply | Qty: 30 | Fill #3

## 2017-01-07 MED FILL — ?ALBUTEROL SUL 2.5 MG/3 MLS: (2.5 MG/3ML | 7 days supply | Qty: 90 | Fill #0

## 2017-01-07 MED FILL — ?FLUOXETINE HCL 20MG TABLET: 20 | 30 days supply | Qty: 30 | Fill #1

## 2017-01-07 MED FILL — SPIRIVA 18 MCG CP-HANDIHALE: 18 | 30 days supply | Qty: 30 | Fill #1

## 2017-01-07 MED FILL — ?CETIRIZINE HCL 10 MG TABLE: 10 | 30 days supply | Qty: 30 | Fill #0

## 2017-01-23 ENCOUNTER — Inpatient Hospital Stay: Payer: Self-pay | Admitting: Family Medicine

## 2017-02-19 ENCOUNTER — Other Ambulatory Visit: Payer: Self-pay | Admitting: Family Medicine

## 2017-02-19 DIAGNOSIS — R1084 Generalized abdominal pain: Secondary | ICD-10-CM

## 2017-02-19 MED FILL — SPIRIVA 18 MCG CP-HANDIHALE: 18 | 30 days supply | Qty: 30 | Fill #2

## 2017-02-19 MED FILL — ?FLUOXETINE HCL 20MG TABLET: 20 | 30 days supply | Qty: 30 | Fill #2

## 2017-02-19 MED FILL — ?CETIRIZINE HCL 10 MG TABLE: 10 | 30 days supply | Qty: 30 | Fill #1

## 2017-02-19 MED FILL — ?ALBUTEROL SUL 2.5 MG/3 MLS: (2.5 MG/3ML | 7 days supply | Qty: 90 | Fill #1

## 2017-02-19 MED FILL — VENTOLIN HFA 90 MCG INHALER: 108 (90 BAS | 16 days supply | Qty: 18 | Fill #2

## 2017-02-21 MED FILL — ?OMEPRAZOLE DR 20 MG CAPSUL: 20 | 30 days supply | Qty: 30 | Fill #0

## 2017-02-27 ENCOUNTER — Encounter: Payer: Self-pay | Admitting: Family Medicine

## 2017-02-27 ENCOUNTER — Ambulatory Visit: Payer: Self-pay | Attending: Family Medicine | Admitting: Family Medicine

## 2017-02-27 DIAGNOSIS — J320 Chronic maxillary sinusitis: Secondary | ICD-10-CM | POA: Insufficient documentation

## 2017-02-27 DIAGNOSIS — F4323 Adjustment disorder with mixed anxiety and depressed mood: Secondary | ICD-10-CM | POA: Insufficient documentation

## 2017-02-27 DIAGNOSIS — F909 Attention-deficit hyperactivity disorder, unspecified type: Secondary | ICD-10-CM | POA: Insufficient documentation

## 2017-02-27 DIAGNOSIS — Z7951 Long term (current) use of inhaled steroids: Secondary | ICD-10-CM | POA: Insufficient documentation

## 2017-02-27 DIAGNOSIS — K219 Gastro-esophageal reflux disease without esophagitis: Secondary | ICD-10-CM | POA: Insufficient documentation

## 2017-02-27 DIAGNOSIS — M25512 Pain in left shoulder: Secondary | ICD-10-CM | POA: Insufficient documentation

## 2017-02-27 DIAGNOSIS — F319 Bipolar disorder, unspecified: Secondary | ICD-10-CM | POA: Insufficient documentation

## 2017-02-27 DIAGNOSIS — Z79899 Other long term (current) drug therapy: Secondary | ICD-10-CM | POA: Insufficient documentation

## 2017-02-27 DIAGNOSIS — G8929 Other chronic pain: Secondary | ICD-10-CM | POA: Insufficient documentation

## 2017-02-27 DIAGNOSIS — J449 Chronic obstructive pulmonary disease, unspecified: Secondary | ICD-10-CM | POA: Insufficient documentation

## 2017-02-27 MED ORDER — FLUOXETINE HCL 20 MG PO TABS: 40.0000 mg | ORAL_TABLET | Freq: Every day | ORAL | 3 refills | Status: DC

## 2017-02-27 MED ORDER — ALBUTEROL SULFATE HFA 108 (90 BASE) MCG/ACT IN AERS: 2.0000 | INHALATION_SPRAY | RESPIRATORY_TRACT | 3 refills | Status: DC | PRN

## 2017-02-27 MED ORDER — ALBUTEROL SULFATE (2.5 MG/3ML) 0.083% IN NEBU
2.5000 mg | INHALATION_SOLUTION | Freq: Four times a day (QID) | RESPIRATORY_TRACT | 2 refills | Status: DC | PRN
Start: 2017-02-27 — End: 2017-05-18

## 2017-02-27 MED ORDER — ACETAMINOPHEN-CODEINE #3 300-30 MG PO TABS: 1.0000 | ORAL_TABLET | Freq: Two times a day (BID) | ORAL | 1 refills | Status: DC | PRN

## 2017-02-27 MED ORDER — TIOTROPIUM BROMIDE MONOHYDRATE 18 MCG IN CAPS: 18.0000 ug | ORAL_CAPSULE | Freq: Every day | RESPIRATORY_TRACT | 3 refills | Status: DC

## 2017-02-27 MED ORDER — CETIRIZINE HCL 10 MG PO TABS: 10.0000 mg | ORAL_TABLET | Freq: Every day | ORAL | 3 refills | Status: DC

## 2017-02-27 MED ORDER — BUDESONIDE-FORMOTEROL FUMARATE 160-4.5 MCG/ACT IN AERO: 2.0000 | INHALATION_SPRAY | Freq: Two times a day (BID) | RESPIRATORY_TRACT | 3 refills | Status: DC

## 2017-02-27 MED FILL — !SYMBICORT 160-4.5 MCG INH: 160-4.5 | 30 days supply | Qty: 1 | Fill #0

## 2017-02-27 MED FILL — ACETAMINOPHEN/COD #3 TABLET: 300-30 | 30 days supply | Qty: 60 | Fill #0

## 2017-02-27 NOTE — Patient Instructions (Signed)
Chronic Obstructive Pulmonary Disease Chronic obstructive pulmonary disease (COPD) is a common lung condition in which airflow from the lungs is limited. COPD is a general term that can be used to describe many different lung problems that limit airflow, including both chronic bronchitis and emphysema. If you have COPD, your lung function will probably never return to normal, but there are measures you can take to improve lung function and make yourself feel better. What are the causes?  Smoking (common).  Exposure to secondhand smoke.  Genetic problems.  Chronic inflammatory lung diseases or recurrent infections. What are the signs or symptoms?  Shortness of breath, especially with physical activity.  Deep, persistent (chronic) cough with a large amount of thick mucus.  Wheezing.  Rapid breaths (tachypnea).  Gray or bluish discoloration (cyanosis) of the skin, especially in your fingers, toes, or lips.  Fatigue.  Weight loss.  Frequent infections or episodes when breathing symptoms become much worse (exacerbations).  Chest tightness. How is this diagnosed? Your health care provider will take a medical history and perform a physical examination to diagnose COPD. Additional tests for COPD may include:  Lung (pulmonary) function tests.  Chest X-ray.  CT scan.  Blood tests. How is this treated? Treatment for COPD may include:  Inhaler and nebulizer medicines. These help manage the symptoms of COPD and make your breathing more comfortable.  Supplemental oxygen. Supplemental oxygen is only helpful if you have a low oxygen level in your blood.  Exercise and physical activity. These are beneficial for nearly all people with COPD.  Lung surgery or transplant.  Nutrition therapy to gain weight, if you are underweight.  Pulmonary rehabilitation. This may involve working with a team of health care providers and specialists, such as respiratory, occupational, and physical  therapists. Follow these instructions at home:  Take all medicines (inhaled or pills) as directed by your health care provider.  Avoid over-the-counter medicines or cough syrups that dry up your airway (such as antihistamines) and slow down the elimination of secretions unless instructed otherwise by your health care provider.  If you are a smoker, the most important thing that you can do is stop smoking. Continuing to smoke will cause further lung damage and breathing trouble. Ask your health care provider for help with quitting smoking. He or she can direct you to community resources or hospitals that provide support.  Avoid exposure to irritants such as smoke, chemicals, and fumes that aggravate your breathing.  Use oxygen therapy and pulmonary rehabilitation if directed by your health care provider. If you require home oxygen therapy, ask your health care provider whether you should purchase a pulse oximeter to measure your oxygen level at home.  Avoid contact with individuals who have a contagious illness.  Avoid extreme temperature and humidity changes.  Eat healthy foods. Eating smaller, more frequent meals and resting before meals may help you maintain your strength.  Stay active, but balance activity with periods of rest. Exercise and physical activity will help you maintain your ability to do things you want to do.  Preventing infection and hospitalization is very important when you have COPD. Make sure to receive all the vaccines your health care provider recommends, especially the pneumococcal and influenza vaccines. Ask your health care provider whether you need a pneumonia vaccine.  Learn and use relaxation techniques to manage stress.  Learn and use controlled breathing techniques as directed by your health care provider. Controlled breathing techniques include: 1. Pursed lip breathing. Start by breathing in (inhaling)   through your nose for 1 second. Then, purse your lips as  if you were going to whistle and breathe out (exhale) through the pursed lips for 2 seconds. 2. Diaphragmatic breathing. Start by putting one hand on your abdomen just above your waist. Inhale slowly through your nose. The hand on your abdomen should move out. Then purse your lips and exhale slowly. You should be able to feel the hand on your abdomen moving in as you exhale.  Learn and use controlled coughing to clear mucus from your lungs. Controlled coughing is a series of short, progressive coughs. The steps of controlled coughing are: 1. Lean your head slightly forward. 2. Breathe in deeply using diaphragmatic breathing. 3. Try to hold your breath for 3 seconds. 4. Keep your mouth slightly open while coughing twice. 5. Spit any mucus out into a tissue. 6. Rest and repeat the steps once or twice as needed. Contact a health care provider if:  You are coughing up more mucus than usual.  There is a change in the color or thickness of your mucus.  Your breathing is more labored than usual.  Your breathing is faster than usual. Get help right away if:  You have shortness of breath while you are resting.  You have shortness of breath that prevents you from:  Being able to talk.  Performing your usual physical activities.  You have chest pain lasting longer than 5 minutes.  Your skin color is more cyanotic than usual.  You measure low oxygen saturations for longer than 5 minutes with a pulse oximeter. This information is not intended to replace advice given to you by your health care provider. Make sure you discuss any questions you have with your health care provider. Document Released: 08/08/2005 Document Revised: 04/05/2016 Document Reviewed: 06/25/2013 Elsevier Interactive Patient Education  2017 Elsevier Inc.  
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
## 2017-02-27 NOTE — Progress Notes (Signed)
Medication refills- would also like tylenol 3 for left sided shoulder pain Asked to speak to [facility]- having a lot of anxiety today

## 2017-02-28 ENCOUNTER — Other Ambulatory Visit: Payer: Self-pay

## 2017-02-28 DIAGNOSIS — J449 Chronic obstructive pulmonary disease, unspecified: Secondary | ICD-10-CM

## 2017-02-28 MED ORDER — FLUTICASONE FUROATE-VILANTEROL 200-25 MCG/INH IN AEPB: 1.0000 | INHALATION_SPRAY | Freq: Every day | RESPIRATORY_TRACT | 1 refills | Status: DC

## 2017-02-28 MED ORDER — ALBUTEROL SULFATE HFA 108 (90 BASE) MCG/ACT IN AERS: 2.0000 | INHALATION_SPRAY | RESPIRATORY_TRACT | 1 refills | Status: DC | PRN

## 2017-02-28 MED ORDER — UMECLIDINIUM BROMIDE 62.5 MCG/INH IN AEPB: 1.0000 | INHALATION_SPRAY | Freq: Every day | RESPIRATORY_TRACT | 1 refills | Status: DC

## 2017-03-14 ENCOUNTER — Ambulatory Visit: Payer: Self-pay | Admitting: Family Medicine

## 2017-03-27 ENCOUNTER — Other Ambulatory Visit: Payer: Self-pay | Admitting: Family Medicine

## 2017-03-27 MED ORDER — FLUOXETINE HCL 40 MG PO CAPS: 40.0000 mg | ORAL_CAPSULE | Freq: Every day | ORAL | 3 refills | Status: DC

## 2017-05-18 ENCOUNTER — Emergency Department (HOSPITAL_COMMUNITY): Payer: Self-pay

## 2017-05-18 ENCOUNTER — Emergency Department (HOSPITAL_COMMUNITY)
Admission: EM | Admit: 2017-05-18 | Discharge: 2017-05-18 | Disposition: A | Discharge status: Home / Self Care | Payer: Self-pay | Attending: Emergency Medicine | Admitting: Emergency Medicine

## 2017-05-18 ENCOUNTER — Encounter (HOSPITAL_COMMUNITY): Payer: Self-pay | Admitting: Emergency Medicine

## 2017-05-18 DIAGNOSIS — J441 Chronic obstructive pulmonary disease with (acute) exacerbation: Secondary | ICD-10-CM | POA: Insufficient documentation

## 2017-05-18 DIAGNOSIS — J449 Chronic obstructive pulmonary disease, unspecified: Secondary | ICD-10-CM

## 2017-05-18 DIAGNOSIS — Z87891 Personal history of nicotine dependence: Secondary | ICD-10-CM | POA: Insufficient documentation

## 2017-05-18 DIAGNOSIS — F909 Attention-deficit hyperactivity disorder, unspecified type: Secondary | ICD-10-CM | POA: Insufficient documentation

## 2017-05-18 LAB — CBC WITH DIFFERENTIAL/PLATELET
BASOS ABS: 0 10*3/uL (ref 0.0–0.1)
Basophils Relative: 0 %
Eosinophils Absolute: 0.3 10*3/uL (ref 0.0–0.7)
Eosinophils Relative: 4 %
HEMATOCRIT: 47.8 % (ref 39.0–52.0)
Hemoglobin: 16.9 g/dL (ref 13.0–17.0)
LYMPHS PCT: 32 %
Lymphs Abs: 2.3 10*3/uL (ref 0.7–4.0)
MCH: 31.9 pg (ref 26.0–34.0)
MCHC: 35.4 g/dL (ref 30.0–36.0)
MCV: 90.2 fL (ref 78.0–100.0)
MONOS PCT: 6 %
Monocytes Absolute: 0.4 10*3/uL (ref 0.1–1.0)
NEUTROS ABS: 4.1 10*3/uL (ref 1.7–7.7)
Neutrophils Relative %: 58 %
Platelets: 230 10*3/uL (ref 150–400)
RBC: 5.3 MIL/uL (ref 4.22–5.81)
RDW: 12.6 % (ref 11.5–15.5)
WBC: 7.1 10*3/uL (ref 4.0–10.5)

## 2017-05-18 LAB — BASIC METABOLIC PANEL
ANION GAP: 8 (ref 5–15)
BUN: 10 mg/dL (ref 6–20)
CALCIUM: 9.8 mg/dL (ref 8.9–10.3)
CO2: 25 mmol/L (ref 22–32)
Chloride: 104 mmol/L (ref 101–111)
Creatinine, Ser: 1.18 mg/dL (ref 0.61–1.24)
GFR calc Af Amer: >60 mL/min (ref >60)
GFR calc non Af Amer: >60 mL/min (ref >60)
GLUCOSE: 97 mg/dL (ref 65–99)
Potassium: 4.2 mmol/L (ref 3.5–5.1)
Sodium: 137 mmol/L (ref 135–145)

## 2017-05-18 LAB — D-DIMER, QUANTITATIVE: D-Dimer, Quant: <0.27 ug/mL-FEU (ref 0.00–0.50)

## 2017-05-18 LAB — TROPONIN I: Troponin I: <0.03 ng/mL (ref <0.03)

## 2017-05-18 LAB — BRAIN NATRIURETIC PEPTIDE: B NATRIURETIC PEPTIDE 5: 8.7 pg/mL (ref 0.0–100.0)

## 2017-05-18 IMAGING — DX DG CHEST 2V
2 series · 2 of 2 positions shown · non-contrast
Comparison: [DATE]

CLINICAL DATA: Shortness of breath for 3 days.  Wheezing.

EXAM:
CHEST  2 VIEW

[chest pa]
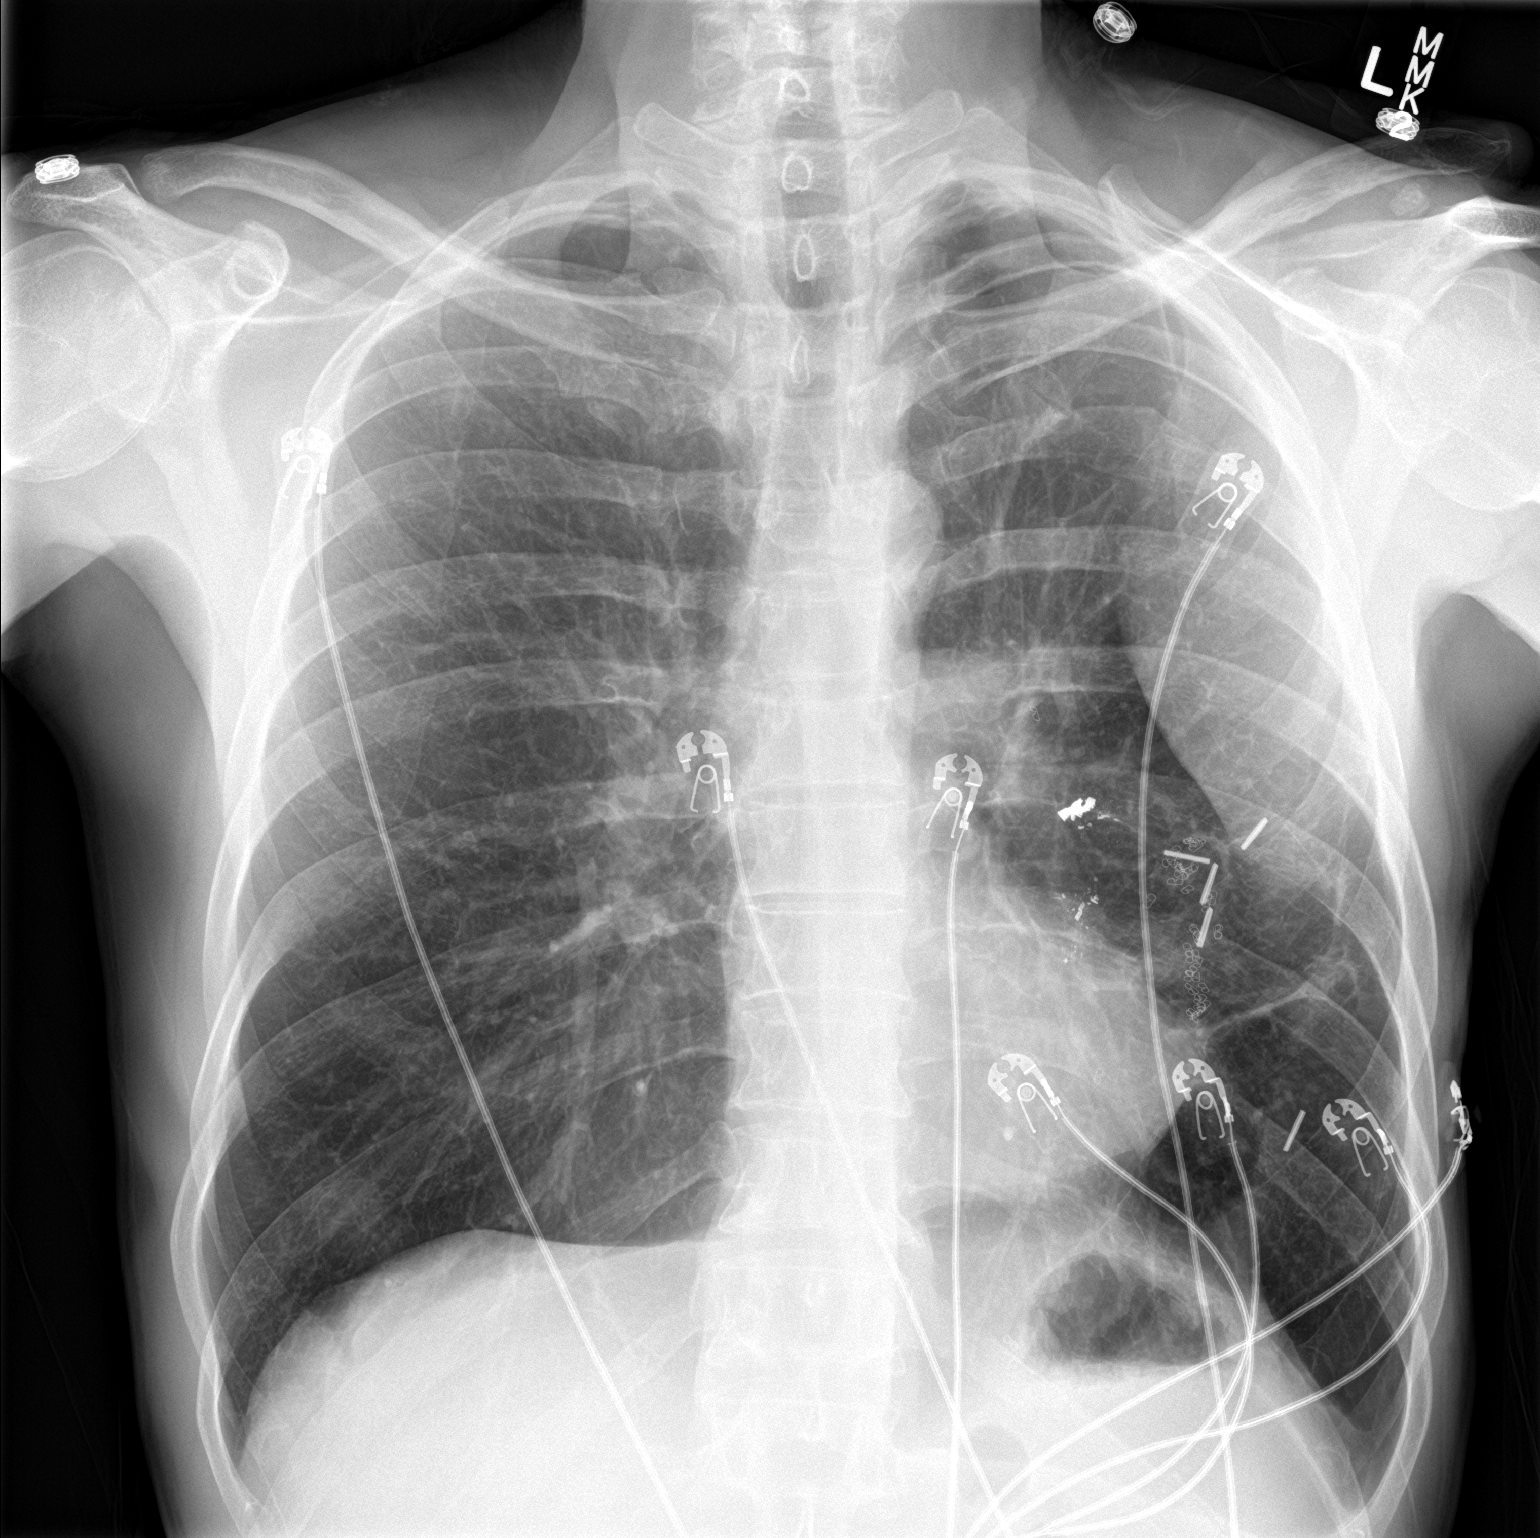

[chest lat]
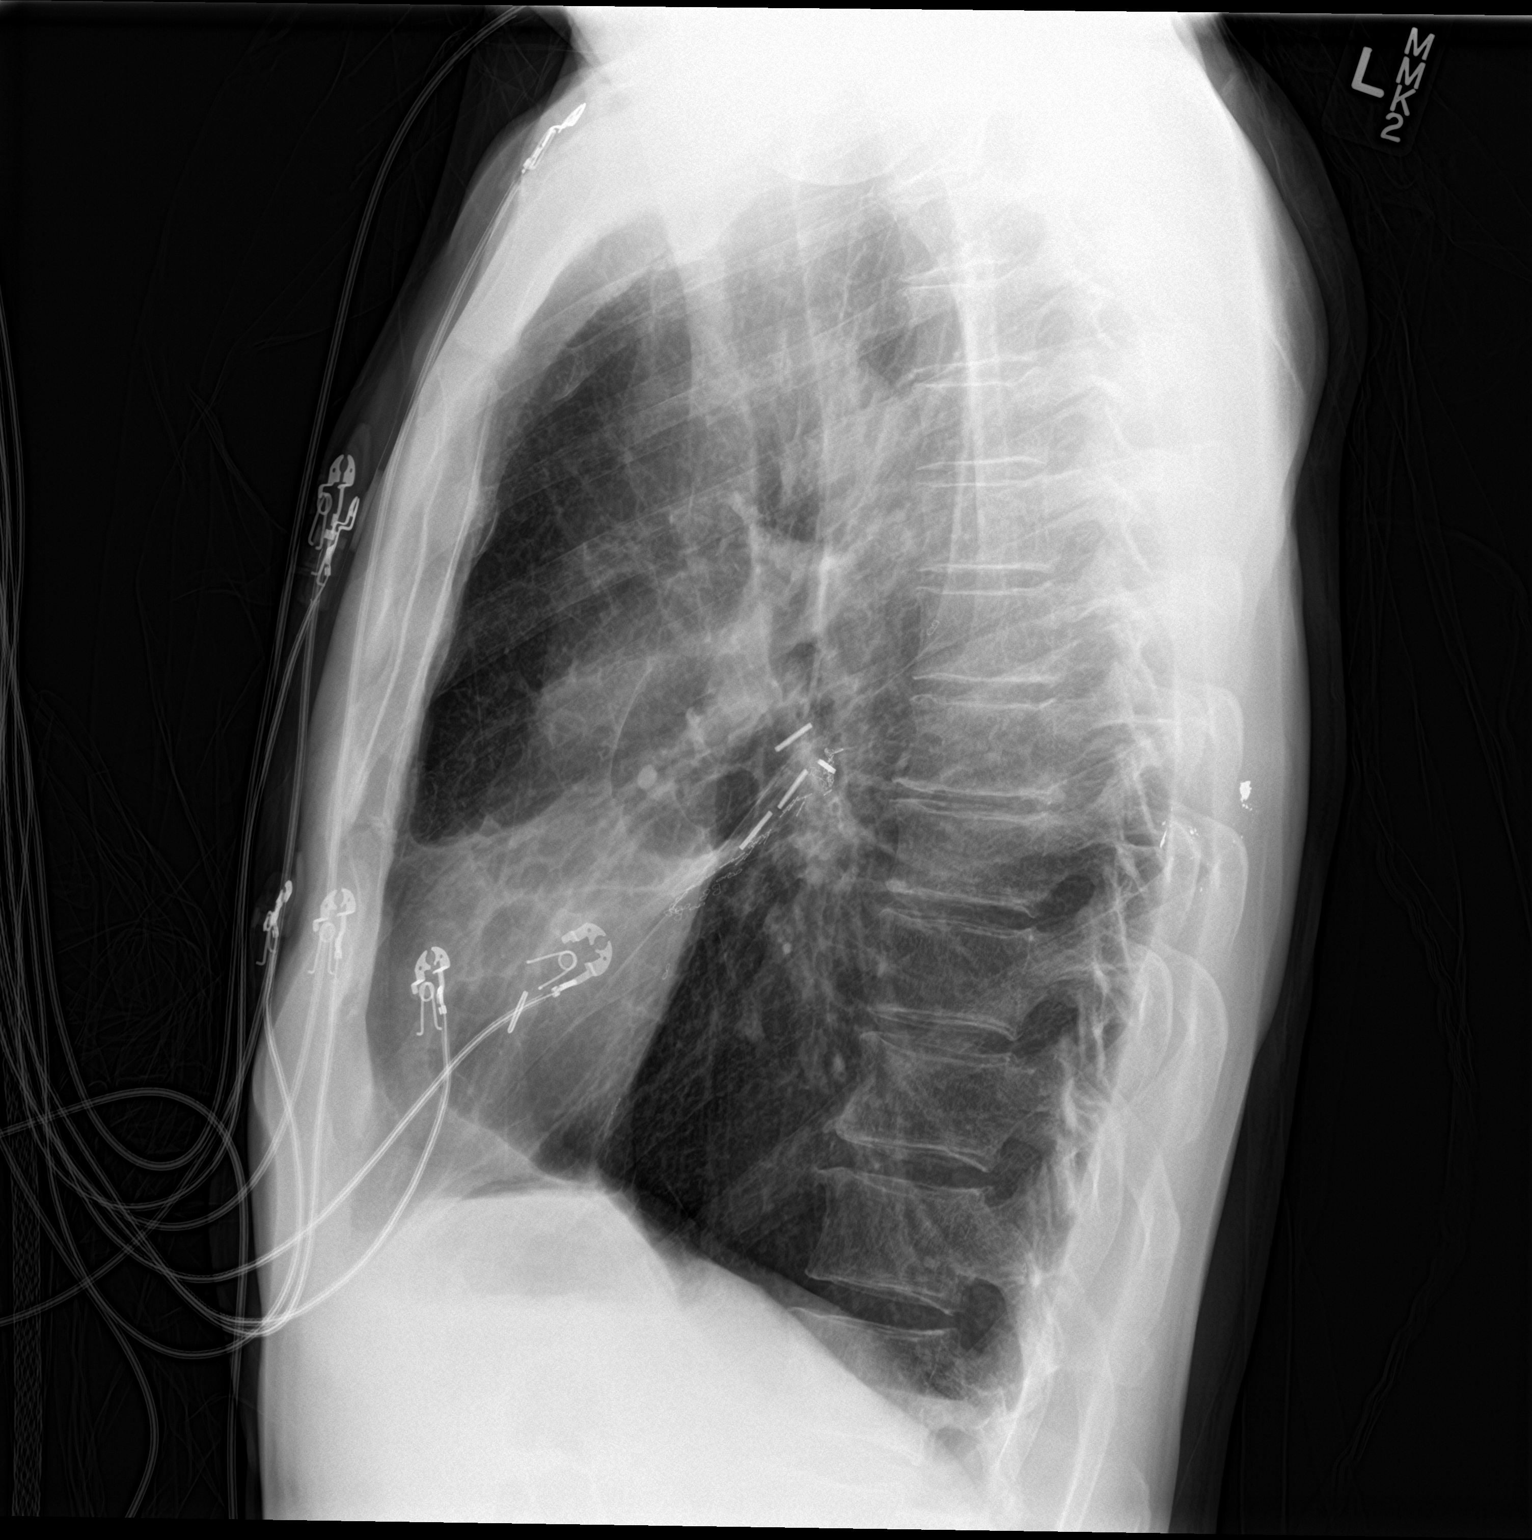

[2 of 2 positions shown; findings below may reference images not displayed]

FINDINGS: Lungs are hyperinflated. There is bronchial thickening. Remote
postsurgical and posttraumatic change in the left lung. Normal heart
size and mediastinal contours are unchanged. Stable hilar prominence
accentuated by emphysema. No consolidation, pulmonary edema, pleural
effusion or pneumothorax. No acute osseous abnormalities.
IMPRESSION: Hyperinflation and bronchial thickening, consistent with COPD.

Remote posttraumatic and postsurgical change in the left hemithorax.

## 2017-05-18 MED ORDER — ALBUTEROL SULFATE HFA 108 (90 BASE) MCG/ACT IN AERS
2.0000 | INHALATION_SPRAY | Freq: Once | RESPIRATORY_TRACT | Status: AC
  Administered 2017-05-18: 2 via RESPIRATORY_TRACT
  Filled 2017-05-18: qty 6.7

## 2017-05-18 MED ORDER — ALBUTEROL SULFATE (2.5 MG/3ML) 0.083% IN NEBU
5.0000 mg | INHALATION_SOLUTION | Freq: Once | RESPIRATORY_TRACT | Status: AC
  Administered 2017-05-18: 5 mg via RESPIRATORY_TRACT

## 2017-05-18 MED ORDER — PREDNISONE 50 MG PO TABS: ORAL_TABLET | ORAL | 0 refills | Status: DC

## 2017-05-18 MED ORDER — ALBUTEROL SULFATE (2.5 MG/3ML) 0.083% IN NEBU
INHALATION_SOLUTION | RESPIRATORY_TRACT | Status: AC
  Filled 2017-05-18: qty 6

## 2017-05-18 MED ORDER — ALBUTEROL SULFATE (2.5 MG/3ML) 0.083% IN NEBU
2.5000 mg | INHALATION_SOLUTION | Freq: Four times a day (QID) | RESPIRATORY_TRACT | 0 refills | Status: DC | PRN
Start: 2017-05-18 — End: 2017-07-22

## 2017-05-18 MED ORDER — METHYLPREDNISOLONE SODIUM SUCC 125 MG IJ SOLR
125.0000 mg | Freq: Once | INTRAMUSCULAR | Status: AC
  Administered 2017-05-18: 125 mg via INTRAVENOUS
  Filled 2017-05-18: qty 2

## 2017-05-18 MED ORDER — DOXYCYCLINE HYCLATE 100 MG PO CAPS: 100.0000 mg | ORAL_CAPSULE | Freq: Two times a day (BID) | ORAL | 0 refills | Status: DC

## 2017-05-18 NOTE — Discharge Instructions (Signed)
Take the steroids and antibiotics as prescribed. Follow up with your doctor. Return to the ED if you develop new or worsening symptoms.

## 2017-05-18 NOTE — ED Notes (Signed)
EDP at bedside  

## 2017-05-18 NOTE — ED Notes (Signed)
Pt verbalized understanding of d/c instructions and has no further questions. Pt is stable, A&Ox4, VSS.  

## 2017-05-18 NOTE — ED Notes (Signed)
Patient transported to X-ray 

## 2017-05-18 NOTE — ED Triage Notes (Signed)
Pt to ED for shortness of breath for last 3 days. Pt started to feel more SOB tonight. Pt has exp and insp wheezing. Pt visibly working and coughing up mucus
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
## 2017-07-21 ENCOUNTER — Emergency Department (HOSPITAL_COMMUNITY)
Admission: EM | Admit: 2017-07-21 | Discharge: 2017-07-22 | Disposition: A | Discharge status: Home / Self Care | Payer: Self-pay | Attending: Emergency Medicine | Admitting: Emergency Medicine

## 2017-07-21 ENCOUNTER — Emergency Department (HOSPITAL_COMMUNITY): Payer: Self-pay

## 2017-07-21 ENCOUNTER — Encounter (HOSPITAL_COMMUNITY): Payer: Self-pay | Admitting: Emergency Medicine

## 2017-07-21 DIAGNOSIS — Z87891 Personal history of nicotine dependence: Secondary | ICD-10-CM | POA: Insufficient documentation

## 2017-07-21 DIAGNOSIS — J45909 Unspecified asthma, uncomplicated: Secondary | ICD-10-CM | POA: Insufficient documentation

## 2017-07-21 DIAGNOSIS — J441 Chronic obstructive pulmonary disease with (acute) exacerbation: Secondary | ICD-10-CM | POA: Insufficient documentation

## 2017-07-21 DIAGNOSIS — F909 Attention-deficit hyperactivity disorder, unspecified type: Secondary | ICD-10-CM | POA: Insufficient documentation

## 2017-07-21 DIAGNOSIS — F319 Bipolar disorder, unspecified: Secondary | ICD-10-CM | POA: Insufficient documentation

## 2017-07-21 LAB — COMPREHENSIVE METABOLIC PANEL
ALBUMIN: 4.2 g/dL (ref 3.5–5.0)
ALT: 15 U/L — ABNORMAL LOW (ref 17–63)
ANION GAP: 11 (ref 5–15)
AST: 16 U/L (ref 15–41)
Alkaline Phosphatase: 59 U/L (ref 38–126)
BILIRUBIN TOTAL: 0.5 mg/dL (ref 0.3–1.2)
BUN: 8 mg/dL (ref 6–20)
CHLORIDE: 103 mmol/L (ref 101–111)
CO2: 22 mmol/L (ref 22–32)
Calcium: 9.1 mg/dL (ref 8.9–10.3)
Creatinine, Ser: 1.08 mg/dL (ref 0.61–1.24)
GFR calc Af Amer: >60 mL/min (ref >60)
GFR calc non Af Amer: >60 mL/min (ref >60)
GLUCOSE: 96 mg/dL (ref 65–99)
POTASSIUM: 3.5 mmol/L (ref 3.5–5.1)
SODIUM: 136 mmol/L (ref 135–145)
Total Protein: 7 g/dL (ref 6.5–8.1)

## 2017-07-21 LAB — CBC WITH DIFFERENTIAL/PLATELET
Basophils Absolute: 0 10*3/uL (ref 0.0–0.1)
Basophils Relative: 0 %
EOS PCT: 4 %
Eosinophils Absolute: 0.3 10*3/uL (ref 0.0–0.7)
HCT: 42.7 % (ref 39.0–52.0)
HEMOGLOBIN: 14.4 g/dL (ref 13.0–17.0)
LYMPHS PCT: 18 %
Lymphs Abs: 1.3 10*3/uL (ref 0.7–4.0)
MCH: 30.8 pg (ref 26.0–34.0)
MCHC: 33.7 g/dL (ref 30.0–36.0)
MCV: 91.2 fL (ref 78.0–100.0)
Monocytes Absolute: 0.8 10*3/uL (ref 0.1–1.0)
Monocytes Relative: 11 %
NEUTROS ABS: 5.1 10*3/uL (ref 1.7–7.7)
NEUTROS PCT: 67 %
PLATELETS: 215 10*3/uL (ref 150–400)
RBC: 4.68 MIL/uL (ref 4.22–5.81)
RDW: 13 % (ref 11.5–15.5)
WBC: 7.5 10*3/uL (ref 4.0–10.5)

## 2017-07-21 LAB — PROTIME-INR
INR: 0.96
Prothrombin Time: 12.7 seconds (ref 11.4–15.2)

## 2017-07-21 LAB — I-STAT CG4 LACTIC ACID, ED: Lactic Acid, Venous: 1.03 mmol/L (ref 0.5–1.9)
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
## 2017-07-21 MED ORDER — AZITHROMYCIN 250 MG PO TABS
500.0000 mg | ORAL_TABLET | Freq: Once | ORAL | Status: AC
  Administered 2017-07-21: 500 mg via ORAL
  Filled 2017-07-21: qty 2

## 2017-07-21 MED ORDER — ALBUTEROL SULFATE (2.5 MG/3ML) 0.083% IN NEBU
INHALATION_SOLUTION | RESPIRATORY_TRACT | Status: AC
  Filled 2017-07-21: qty 6

## 2017-07-21 MED ORDER — ALBUTEROL SULFATE (2.5 MG/3ML) 0.083% IN NEBU
5.0000 mg | INHALATION_SOLUTION | Freq: Once | RESPIRATORY_TRACT | Status: AC
  Administered 2017-07-21: 5 mg via RESPIRATORY_TRACT

## 2017-07-21 MED ORDER — IPRATROPIUM-ALBUTEROL 0.5-2.5 (3) MG/3ML IN SOLN
3.0000 mL | RESPIRATORY_TRACT | Status: AC
  Administered 2017-07-21 (×3): 3 mL via RESPIRATORY_TRACT
  Filled 2017-07-21 (×3): qty 3

## 2017-07-21 MED ORDER — PREDNISONE 20 MG PO TABS
60.0000 mg | ORAL_TABLET | Freq: Once | ORAL | Status: AC
  Administered 2017-07-21: 60 mg via ORAL
  Filled 2017-07-21: qty 3

## 2017-07-21 NOTE — ED Triage Notes (Signed)
Reports left sided chest pressure and sob since yesterday.  Hx of COPD, emphysema.  Noted to be labored in triage.  Lungs coarse and wheezy.
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
## 2017-07-22 MED ORDER — AZITHROMYCIN 250 MG PO TABS: 250.0000 mg | ORAL_TABLET | Freq: Every day | ORAL | 0 refills | Status: DC

## 2017-07-22 MED ORDER — ALBUTEROL SULFATE (2.5 MG/3ML) 0.083% IN NEBU: 2.5000 mg | INHALATION_SOLUTION | RESPIRATORY_TRACT | 1 refills | Status: DC | PRN

## 2017-07-22 MED ORDER — PREDNISONE 20 MG PO TABS: ORAL_TABLET | ORAL | 0 refills | Status: DC

## 2017-07-22 MED ORDER — ALBUTEROL SULFATE HFA 108 (90 BASE) MCG/ACT IN AERS
2.0000 | INHALATION_SPRAY | RESPIRATORY_TRACT | Status: DC
  Administered 2017-07-22: 2 via RESPIRATORY_TRACT
  Filled 2017-07-22: qty 6.7

## 2017-07-26 LAB — CULTURE, BLOOD (ROUTINE X 2)
CULTURE: NO GROWTH
Culture: NO GROWTH
SPECIAL REQUESTS: ADEQUATE
Special Requests: ADEQUATE

## 2017-07-30 ENCOUNTER — Encounter (HOSPITAL_COMMUNITY): Payer: Self-pay

## 2017-07-30 ENCOUNTER — Emergency Department (HOSPITAL_COMMUNITY)
Admission: EM | Admit: 2017-07-30 | Discharge: 2017-07-31 | Disposition: A | Discharge status: Home / Self Care | Payer: Self-pay | Attending: Emergency Medicine | Admitting: Emergency Medicine

## 2017-07-30 DIAGNOSIS — R45851 Suicidal ideations: Secondary | ICD-10-CM | POA: Insufficient documentation

## 2017-07-30 DIAGNOSIS — F14159 Cocaine abuse with cocaine-induced psychotic disorder, unspecified: Secondary | ICD-10-CM

## 2017-07-30 DIAGNOSIS — Z87891 Personal history of nicotine dependence: Secondary | ICD-10-CM | POA: Insufficient documentation

## 2017-07-30 DIAGNOSIS — F1414 Cocaine abuse with cocaine-induced mood disorder: Secondary | ICD-10-CM

## 2017-07-30 DIAGNOSIS — J449 Chronic obstructive pulmonary disease, unspecified: Secondary | ICD-10-CM | POA: Insufficient documentation

## 2017-07-30 LAB — COMPREHENSIVE METABOLIC PANEL
ALK PHOS: 48 U/L (ref 38–126)
ALT: 14 U/L — ABNORMAL LOW (ref 17–63)
AST: 16 U/L (ref 15–41)
Albumin: 4.6 g/dL (ref 3.5–5.0)
Anion gap: 9 (ref 5–15)
BUN: 11 mg/dL (ref 6–20)
CALCIUM: 9.3 mg/dL (ref 8.9–10.3)
CHLORIDE: 102 mmol/L (ref 101–111)
CO2: 25 mmol/L (ref 22–32)
Creatinine, Ser: 1.01 mg/dL (ref 0.61–1.24)
GFR calc Af Amer: >60 mL/min (ref >60)
Glucose, Bld: 92 mg/dL (ref 65–99)
Potassium: 3.9 mmol/L (ref 3.5–5.1)
Sodium: 136 mmol/L (ref 135–145)
Total Bilirubin: 0.7 mg/dL (ref 0.3–1.2)
Total Protein: 7.2 g/dL (ref 6.5–8.1)

## 2017-07-30 LAB — ETHANOL

## 2017-07-30 LAB — RAPID URINE DRUG SCREEN, HOSP PERFORMED
AMPHETAMINES: NOT DETECTED
BARBITURATES: NOT DETECTED
Benzodiazepines: NOT DETECTED
COCAINE: POSITIVE — AB
OPIATES: NOT DETECTED
TETRAHYDROCANNABINOL: POSITIVE — AB

## 2017-07-30 LAB — CBC
HEMATOCRIT: 43.1 % (ref 39.0–52.0)
HEMOGLOBIN: 14.9 g/dL (ref 13.0–17.0)
MCH: 31.4 pg (ref 26.0–34.0)
MCHC: 34.6 g/dL (ref 30.0–36.0)
MCV: 90.9 fL (ref 78.0–100.0)
Platelets: 308 10*3/uL (ref 150–400)
RBC: 4.74 MIL/uL (ref 4.22–5.81)
RDW: 12.6 % (ref 11.5–15.5)
WBC: 7.9 10*3/uL (ref 4.0–10.5)

## 2017-07-30 LAB — ACETAMINOPHEN LEVEL: Acetaminophen (Tylenol), Serum: <10 ug/mL — ABNORMAL LOW (ref 10–30)

## 2017-07-30 LAB — SALICYLATE LEVEL: Salicylate Lvl: <7 mg/dL (ref 2.8–30.0)

## 2017-07-30 MED ORDER — HYDROCORTISONE 2.5 % RE CREA
TOPICAL_CREAM | Freq: Four times a day (QID) | RECTAL | Status: DC
  Filled 2017-07-30: qty 28.35

## 2017-07-30 MED ORDER — LORAZEPAM 1 MG PO TABS: 1.0000 mg | ORAL_TABLET | Freq: Four times a day (QID) | ORAL | Status: DC | PRN

## 2017-07-30 MED ORDER — ALBUTEROL SULFATE HFA 108 (90 BASE) MCG/ACT IN AERS
2.0000 | INHALATION_SPRAY | RESPIRATORY_TRACT | Status: DC | PRN
Start: 2017-07-30 — End: 2017-07-31
  Administered 2017-07-30 – 2017-07-31 (×3): 2 via RESPIRATORY_TRACT
  Filled 2017-07-30 (×2): qty 6.7

## 2017-07-30 MED ORDER — ALBUTEROL SULFATE (2.5 MG/3ML) 0.083% IN NEBU: 2.5000 mg | INHALATION_SOLUTION | RESPIRATORY_TRACT | Status: DC | PRN

## 2017-07-30 NOTE — BH Assessment (Signed)
Kenneth Mcdowell from [facility] attempted to complete this patient's TTS assessment via tele psych. Due to technical difficulties the machine stopped working and Kenneth Mcdowell was unable to complete the assessment. Writer attempted to trouble shoot the machine. Patient started yelling, "What kind of shit is this" ....."I came for help and you guys put a machine in the room"...Kenneth Mcdowell"Where is the humanity"......Kenneth Mcdowell"What happen to talking to face to face"....."I am human and you guys treat me like I'm a prisoner"....."I come for help and you hold me in a room with a idiotic machine". Patient is evidently frustrated not only about the process of telepsych but also about the technical difficulties. Writer offered patient a face to face instead a tele assessment. Patient yelled, "I want to leave.. I want to get out of here".
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
## 2017-07-30 NOTE — ED Notes (Signed)
Pt c/o pain and bleeding from hemorrhoids. Pt reports this is an ongoing problem. This nurse notified EDP.

## 2017-07-30 NOTE — ED Notes (Signed)
Pt mother at bedside visiting with pt.  

## 2017-07-30 NOTE — ED Notes (Signed)
Pt admitted to room #43. Pt irritable at times, sad affect. Pt endorsing SI-verbally contracts for safety. Pt denies HI/AVH.Pt reports he wants help for his cocaine addiction. Pt endorsing hopelessness. "Everything I do is a failure." "I have no purpose here anymore." Pt noncompliant with medication regimen, reports he self medicates with marijuana. Encouragement and support provided. Special checks q 15 mins in place for safety. Video monitoring in place. Will continue to monitor.
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
## 2017-07-30 NOTE — ED Notes (Signed)
PT. Belongings are in the pt. Cabinet in triage. Nurse aware.

## 2017-07-30 NOTE — ED Notes (Signed)
Bed: [facility] Expected date:  Expected time:  Means of arrival:  Comments: Triage 4

## 2017-07-30 NOTE — ED Triage Notes (Addendum)
Pt presents "contemplateing" suicide and wanting cocaine detox.  Pt sts several plan including "hanging and shooting self."  Sts auditory hallucination named "Darlene" and sts "she calls my name."  Hx of bi polar and ADHD.  Pt reports he decided to seek help, because he's at his "breaking point."  Pt reports HI towards specific people.  Last cocaine use was yesterday.      Hx of COPD.  Pt c/o some SOB.  Easily speaking full sentences.

## 2017-07-31 DIAGNOSIS — R4587 Impulsiveness: Secondary | ICD-10-CM

## 2017-07-31 DIAGNOSIS — R450 Nervousness: Secondary | ICD-10-CM

## 2017-07-31 DIAGNOSIS — F1910 Other psychoactive substance abuse, uncomplicated: Secondary | ICD-10-CM

## 2017-07-31 DIAGNOSIS — F1210 Cannabis abuse, uncomplicated: Secondary | ICD-10-CM

## 2017-07-31 DIAGNOSIS — F14159 Cocaine abuse with cocaine-induced psychotic disorder, unspecified: Secondary | ICD-10-CM

## 2017-07-31 DIAGNOSIS — F1414 Cocaine abuse with cocaine-induced mood disorder: Secondary | ICD-10-CM

## 2017-07-31 DIAGNOSIS — R451 Restlessness and agitation: Secondary | ICD-10-CM

## 2017-07-31 DIAGNOSIS — Z87891 Personal history of nicotine dependence: Secondary | ICD-10-CM

## 2017-07-31 DIAGNOSIS — F419 Anxiety disorder, unspecified: Secondary | ICD-10-CM

## 2017-07-31 HISTORY — DX: Cocaine abuse with cocaine-induced mood disorder: F14.14

## 2017-07-31 MED ORDER — HALOPERIDOL 2 MG PO TABS
2.0000 mg | ORAL_TABLET | Freq: Two times a day (BID) | ORAL | Status: DC
  Administered 2017-07-31: 2 mg via ORAL
  Filled 2017-07-31: qty 1

## 2017-07-31 MED ORDER — GABAPENTIN 300 MG PO CAPS
300.0000 mg | ORAL_CAPSULE | Freq: Two times a day (BID) | ORAL | Status: DC
  Administered 2017-07-31: 300 mg via ORAL
  Filled 2017-07-31: qty 1

## 2017-07-31 NOTE — BHH Suicide Risk Assessment (Signed)
Suicide Risk Assessment  Discharge Assessment   [facility] Discharge Suicide Risk Assessment   Principal Problem: Cocaine abuse with cocaine-induced mood disorder [facility]) Discharge Diagnoses:  Patient Active Problem List   Diagnosis Date Noted  . Cocaine abuse with cocaine-induced mood disorder ([place]) [F14.14] 07/31/2017  . Cocaine abuse with cocaine-induced psychotic disorder ([place]) [F14.159] 07/31/2017  . Left shoulder pain [M25.512] 04/16/2016  . GERD (gastroesophageal reflux disease) [K21.9] 03/07/2016  . COPD with asthma ([place]) [J44.9] 03/15/2014  . Loss of weight [R63.4] 03/15/2014  . Lung mass [R91.8] 03/15/2014    Total Time spent with patient: 45 minutes  Musculoskeletal: Strength & Muscle Tone: within normal limits Gait & Station: normal Patient leans: N/A  Psychiatric Specialty Exam: Physical Exam  Constitutional: He is oriented to person, place, and time. He appears well-developed and well-nourished.  Respiratory: Effort normal.  Musculoskeletal: Normal range of motion.  Neurological: He is alert and oriented to person, place, and time.  Psychiatric: His speech is normal. Thought content normal. His mood appears anxious. He is agitated. Cognition and memory are impaired. He expresses impulsivity. He exhibits a depressed mood.   Review of Systems  Psychiatric/Behavioral: Positive for depression and substance abuse. Negative for hallucinations, memory loss and suicidal ideas. The patient is nervous/anxious. The patient does not have insomnia.   All other systems reviewed and are negative.  Blood pressure 115/75, pulse 88, temperature 97.8 F (36.6 C), temperature source Oral, resp. rate 20, SpO2 98 %.There is no height or weight on file to calculate BMI. General Appearance: Disheveled Eye Contact:  Good Speech:  Clear and Coherent and Normal Rate Volume:  Normal Mood:  Anxious, Depressed and Irritable Affect:  Congruent and Depressed Thought Process:  Coherent, Goal  Directed and Linear Orientation:  Full (Time, Place, and Person) Thought Content:  Logical Suicidal Thoughts:  No Homicidal Thoughts:  No Memory:  Immediate;   Good Recent;   Good Remote;   Fair Judgement:  Fair Insight:  Fair Psychomotor Activity:  Normal Concentration:  Concentration: Good and Attention Span: Good Recall:  Good Fund of Knowledge:  Good Language:  Good Akathisia:  No Handed:  Right AIMS (if indicated):    Assets:  Agricultural consultant Housing Resilience Social Support ADL's:  Intact Cognition:  WNL   Mental Status Per Nursing Assessment::   On Admission:   suicidal while "high" on drugs  Demographic Factors:  Male, Caucasian and Low socioeconomic status  Loss Factors: Financial problems/change in socioeconomic status  Historical Factors: Impulsivity  Risk Reduction Factors:   Sense of responsibility to family and Living with another person, especially a relative  Continued Clinical Symptoms:  Depression:   Impulsivity Alcohol/Substance Abuse/Dependencies  Cognitive Features That Contribute To Risk:  Closed-mindedness    Suicide Risk:  Minimal: No identifiable suicidal ideation.  Patients presenting with no risk factors but with morbid ruminations; may be classified as minimal risk based on the severity of the depressive symptoms  Follow-up Information    Services, [facility] Follow up.   Specialty:  Behavioral Health Why:  Please follow up and bring discharge instructions. Open 8AM-4PM Contact information: [address] [place] [id number] [phone]           Plan Of Care/Follow-up recommendations:  Activity:  as tolerated Diet:  Heart Healthy  Ethelene Hal, NP 07/31/2017, 2:41 PM
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
## 2017-07-31 NOTE — ED Notes (Signed)
Pt mother at bedside visiting with pt.  

## 2017-07-31 NOTE — ED Notes (Signed)
Pt d/c home per MD order. Discharge summary reviewed with pt. Pt verbalizes understanding. Pt denies SI/HI/AVH. Pt signed e-signature. Pt ambulatory off unit with mother and MHT.

## 2017-07-31 NOTE — Progress Notes (Signed)
9/191/18 1352:  Pt was with visitor.  Victorino Sparrow, LRT/CTRS

## 2017-09-11 ENCOUNTER — Encounter (HOSPITAL_COMMUNITY): Payer: Self-pay

## 2017-09-11 ENCOUNTER — Emergency Department (HOSPITAL_COMMUNITY)
Admission: EM | Admit: 2017-09-11 | Discharge: 2017-09-11 | Disposition: A | Discharge status: Home / Self Care | Payer: Self-pay | Attending: Emergency Medicine | Admitting: Emergency Medicine

## 2017-09-11 ENCOUNTER — Emergency Department (HOSPITAL_COMMUNITY): Payer: Self-pay

## 2017-09-11 DIAGNOSIS — Z87891 Personal history of nicotine dependence: Secondary | ICD-10-CM | POA: Insufficient documentation

## 2017-09-11 DIAGNOSIS — J45909 Unspecified asthma, uncomplicated: Secondary | ICD-10-CM | POA: Insufficient documentation

## 2017-09-11 DIAGNOSIS — F319 Bipolar disorder, unspecified: Secondary | ICD-10-CM | POA: Insufficient documentation

## 2017-09-11 DIAGNOSIS — J441 Chronic obstructive pulmonary disease with (acute) exacerbation: Secondary | ICD-10-CM | POA: Insufficient documentation

## 2017-09-11 DIAGNOSIS — F909 Attention-deficit hyperactivity disorder, unspecified type: Secondary | ICD-10-CM | POA: Insufficient documentation

## 2017-09-11 DIAGNOSIS — F1410 Cocaine abuse, uncomplicated: Secondary | ICD-10-CM | POA: Insufficient documentation

## 2017-09-11 DIAGNOSIS — J449 Chronic obstructive pulmonary disease, unspecified: Secondary | ICD-10-CM

## 2017-09-11 LAB — BASIC METABOLIC PANEL
ANION GAP: 11 (ref 5–15)
BUN: 10 mg/dL (ref 6–20)
CALCIUM: 9.3 mg/dL (ref 8.9–10.3)
CO2: 23 mmol/L (ref 22–32)
Chloride: 105 mmol/L (ref 101–111)
Creatinine, Ser: 1.04 mg/dL (ref 0.61–1.24)
Glucose, Bld: 116 mg/dL — ABNORMAL HIGH (ref 65–99)
Potassium: 3.7 mmol/L (ref 3.5–5.1)
SODIUM: 139 mmol/L (ref 135–145)

## 2017-09-11 LAB — CBC
HCT: 42.9 % (ref 39.0–52.0)
HEMOGLOBIN: 14.7 g/dL (ref 13.0–17.0)
MCH: 30.9 pg (ref 26.0–34.0)
MCHC: 34.3 g/dL (ref 30.0–36.0)
MCV: 90.1 fL (ref 78.0–100.0)
Platelets: 239 10*3/uL (ref 150–400)
RBC: 4.76 MIL/uL (ref 4.22–5.81)
RDW: 12.5 % (ref 11.5–15.5)
WBC: 10 10*3/uL (ref 4.0–10.5)

## 2017-09-11 LAB — I-STAT TROPONIN, ED: TROPONIN I, POC: 0 ng/mL (ref 0.00–0.08)
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
## 2017-09-11 MED ORDER — ALBUTEROL SULFATE HFA 108 (90 BASE) MCG/ACT IN AERS
2.0000 | INHALATION_SPRAY | RESPIRATORY_TRACT | Status: DC
  Administered 2017-09-11: 2 via RESPIRATORY_TRACT
  Filled 2017-09-11: qty 6.7

## 2017-09-11 MED ORDER — PREDNISONE 20 MG PO TABS: 40.0000 mg | ORAL_TABLET | Freq: Every day | ORAL | 0 refills | Status: AC

## 2017-09-11 MED ORDER — PREDNISONE 20 MG PO TABS
60.0000 mg | ORAL_TABLET | Freq: Once | ORAL | Status: AC
  Administered 2017-09-11: 60 mg via ORAL
  Filled 2017-09-11: qty 3

## 2017-09-11 MED ORDER — ALBUTEROL SULFATE (2.5 MG/3ML) 0.083% IN NEBU: 2.5000 mg | INHALATION_SOLUTION | RESPIRATORY_TRACT | 1 refills | Status: DC | PRN

## 2017-09-11 MED ORDER — ALBUTEROL SULFATE HFA 108 (90 BASE) MCG/ACT IN AERS: 2.0000 | INHALATION_SPRAY | RESPIRATORY_TRACT | 1 refills | Status: DC | PRN

## 2017-09-11 MED FILL — !VENTOLIN HFA INHALER: 108 (90 BAS | 25 days supply | Qty: 18 | Fill #0

## 2017-09-11 MED FILL — ?PREDNISONE 20MG TABLET: 20 | 5 days supply | Qty: 10 | Fill #0

## 2017-09-11 MED FILL — ALBUTEROL 0.083% INHAL SOLN: (2.5 MG/3ML | 5 days supply | Qty: 90 | Fill #0
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
## 2017-09-11 NOTE — ED Triage Notes (Signed)
Patient complains of COPD exacerbation x 2 days,. Complains of increased congestion and cough. Used nebs x 3 this am with some relief. Speaking complete sentences, chest tightness with same

## 2017-10-27 ENCOUNTER — Encounter (HOSPITAL_COMMUNITY): Payer: Self-pay

## 2017-10-27 ENCOUNTER — Inpatient Hospital Stay (HOSPITAL_COMMUNITY)
Admission: EM | Admit: 2017-10-27 | Discharge: 2017-10-30 | DRG: 308 | Disposition: A | Discharge status: Home / Self Care | Payer: Self-pay | Attending: Internal Medicine | Admitting: Internal Medicine

## 2017-10-27 ENCOUNTER — Emergency Department (HOSPITAL_COMMUNITY): Payer: Self-pay

## 2017-10-27 ENCOUNTER — Other Ambulatory Visit: Payer: Self-pay

## 2017-10-27 DIAGNOSIS — I493 Ventricular premature depolarization: Secondary | ICD-10-CM | POA: Diagnosis present

## 2017-10-27 DIAGNOSIS — F411 Generalized anxiety disorder: Secondary | ICD-10-CM | POA: Diagnosis present

## 2017-10-27 DIAGNOSIS — J449 Chronic obstructive pulmonary disease, unspecified: Secondary | ICD-10-CM | POA: Diagnosis present

## 2017-10-27 DIAGNOSIS — F909 Attention-deficit hyperactivity disorder, unspecified type: Secondary | ICD-10-CM

## 2017-10-27 DIAGNOSIS — K649 Unspecified hemorrhoids: Secondary | ICD-10-CM | POA: Diagnosis present

## 2017-10-27 DIAGNOSIS — I4891 Unspecified atrial fibrillation: Secondary | ICD-10-CM

## 2017-10-27 DIAGNOSIS — K219 Gastro-esophageal reflux disease without esophagitis: Secondary | ICD-10-CM | POA: Diagnosis present

## 2017-10-27 DIAGNOSIS — J9601 Acute respiratory failure with hypoxia: Secondary | ICD-10-CM | POA: Diagnosis present

## 2017-10-27 DIAGNOSIS — F319 Bipolar disorder, unspecified: Secondary | ICD-10-CM | POA: Diagnosis present

## 2017-10-27 DIAGNOSIS — Z79899 Other long term (current) drug therapy: Secondary | ICD-10-CM

## 2017-10-27 DIAGNOSIS — Z91128 Patient's intentional underdosing of medication regimen for other reason: Secondary | ICD-10-CM

## 2017-10-27 DIAGNOSIS — Z66 Do not resuscitate: Secondary | ICD-10-CM | POA: Diagnosis present

## 2017-10-27 DIAGNOSIS — F17290 Nicotine dependence, other tobacco product, uncomplicated: Secondary | ICD-10-CM | POA: Diagnosis present

## 2017-10-27 DIAGNOSIS — J441 Chronic obstructive pulmonary disease with (acute) exacerbation: Secondary | ICD-10-CM

## 2017-10-27 DIAGNOSIS — I480 Paroxysmal atrial fibrillation: Principal | ICD-10-CM | POA: Diagnosis present

## 2017-10-27 DIAGNOSIS — R918 Other nonspecific abnormal finding of lung field: Secondary | ICD-10-CM | POA: Diagnosis present

## 2017-10-27 DIAGNOSIS — K625 Hemorrhage of anus and rectum: Secondary | ICD-10-CM | POA: Diagnosis present

## 2017-10-27 DIAGNOSIS — I472 Ventricular tachycardia: Secondary | ICD-10-CM | POA: Diagnosis present

## 2017-10-27 DIAGNOSIS — F14159 Cocaine abuse with cocaine-induced psychotic disorder, unspecified: Secondary | ICD-10-CM | POA: Diagnosis present

## 2017-10-27 DIAGNOSIS — Z825 Family history of asthma and other chronic lower respiratory diseases: Secondary | ICD-10-CM

## 2017-10-27 DIAGNOSIS — Z9119 Patient's noncompliance with other medical treatment and regimen: Secondary | ICD-10-CM

## 2017-10-27 DIAGNOSIS — F410 Panic disorder [episodic paroxysmal anxiety] without agoraphobia: Secondary | ICD-10-CM | POA: Diagnosis present

## 2017-10-27 HISTORY — DX: Unspecified atrial fibrillation: I48.91

## 2017-10-27 LAB — HEPARIN LEVEL (UNFRACTIONATED): Heparin Unfractionated: <0.1 IU/mL — ABNORMAL LOW (ref 0.30–0.70)

## 2017-10-27 LAB — BASIC METABOLIC PANEL
ANION GAP: 12 (ref 5–15)
BUN: 9 mg/dL (ref 6–20)
CALCIUM: 9.1 mg/dL (ref 8.9–10.3)
CO2: 23 mmol/L (ref 22–32)
CREATININE: 1.12 mg/dL (ref 0.61–1.24)
Chloride: 104 mmol/L (ref 101–111)
Glucose, Bld: 138 mg/dL — ABNORMAL HIGH (ref 65–99)
Potassium: 3.7 mmol/L (ref 3.5–5.1)
SODIUM: 139 mmol/L (ref 135–145)

## 2017-10-27 LAB — CBC
HCT: 43.2 % (ref 39.0–52.0)
HEMOGLOBIN: 14.8 g/dL (ref 13.0–17.0)
MCH: 31.1 pg (ref 26.0–34.0)
MCHC: 34.3 g/dL (ref 30.0–36.0)
MCV: 90.8 fL (ref 78.0–100.0)
PLATELETS: 214 10*3/uL (ref 150–400)
RBC: 4.76 MIL/uL (ref 4.22–5.81)
RDW: 12.3 % (ref 11.5–15.5)
WBC: 11.8 10*3/uL — AB (ref 4.0–10.5)

## 2017-10-27 LAB — TSH: TSH: 0.538 u[IU]/mL (ref 0.350–4.500)

## 2017-10-27 LAB — ETHANOL: Alcohol, Ethyl (B): <10 mg/dL (ref <10)

## 2017-10-27 LAB — MAGNESIUM: Magnesium: 2 mg/dL (ref 1.7–2.4)

## 2017-10-27 LAB — I-STAT TROPONIN, ED: TROPONIN I, POC: 0 ng/mL (ref 0.00–0.08)

## 2017-10-27 LAB — MRSA PCR SCREENING: MRSA BY PCR: NEGATIVE

## 2017-10-27 LAB — TROPONIN I

## 2017-10-27 IMAGING — DX DG CHEST 1V PORT
2 series · 2 of 2 positions shown · non-contrast
Comparison: [DATE]

CLINICAL DATA: Shortness of Breath

EXAM:
PORTABLE CHEST 1 VIEW

[chest ap (1 of 2)]
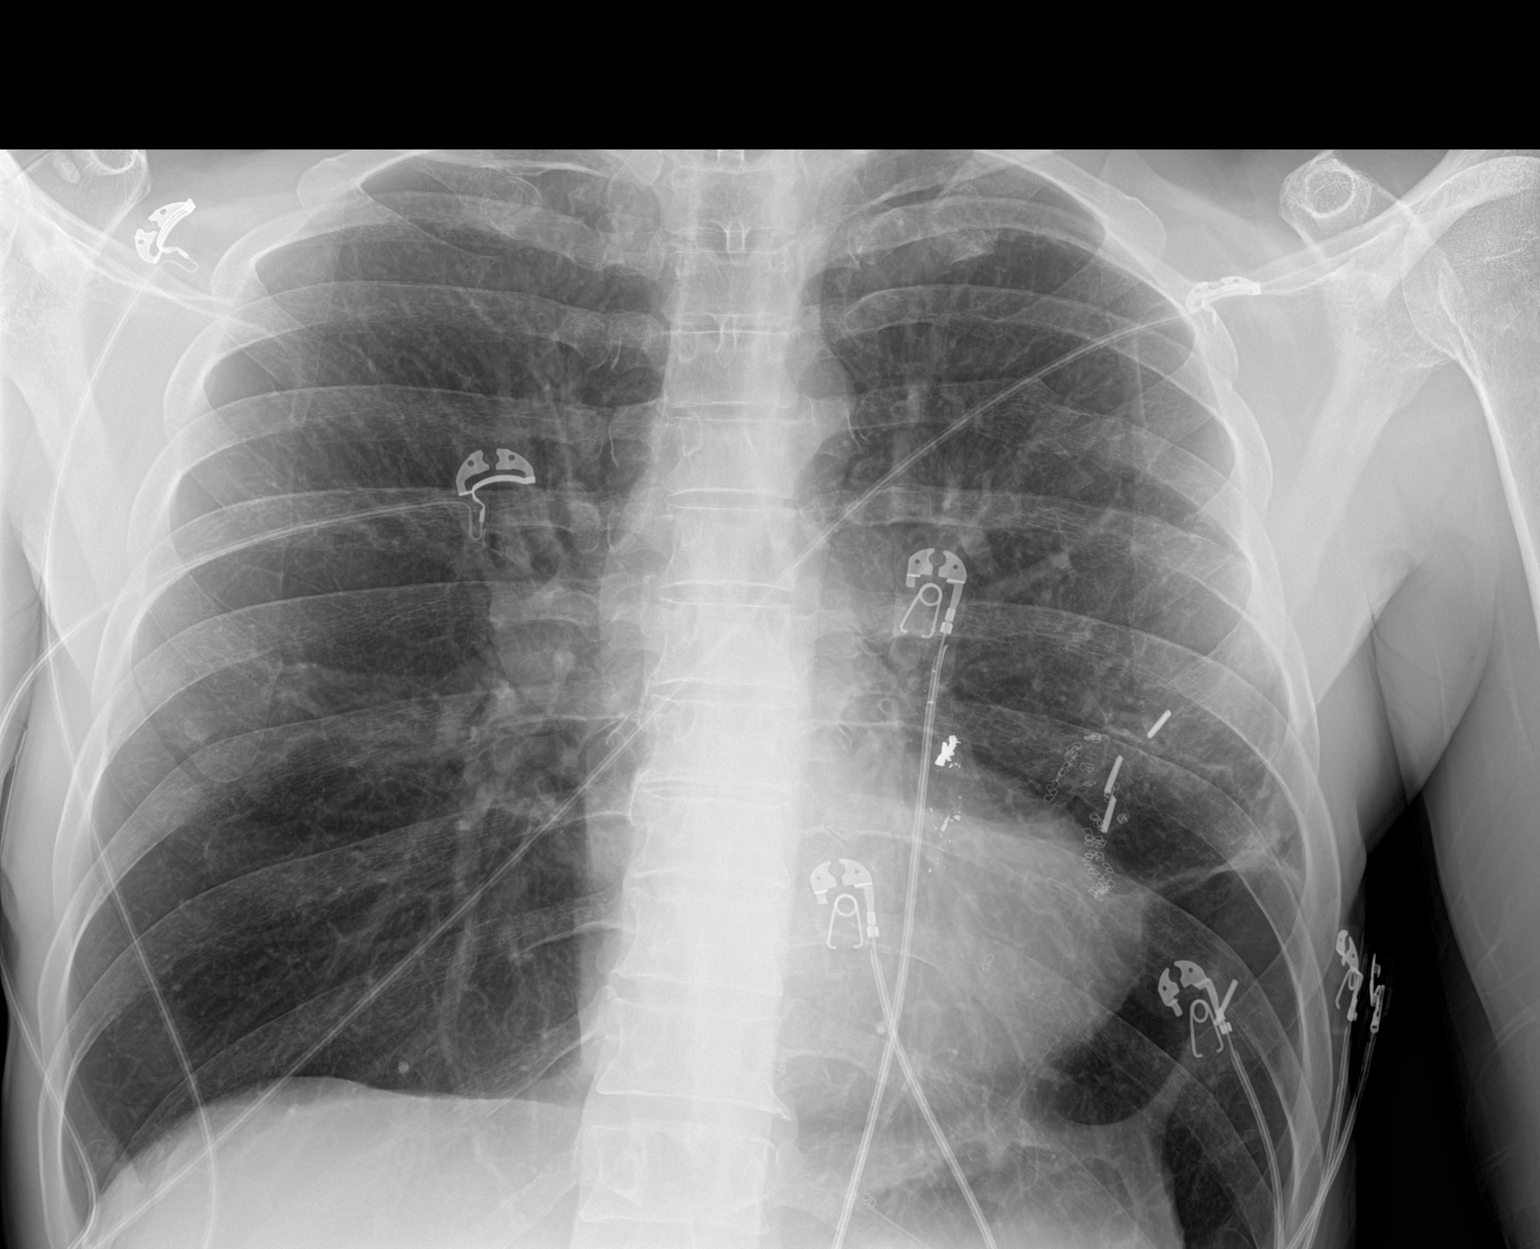

[chest ap (2 of 2)]
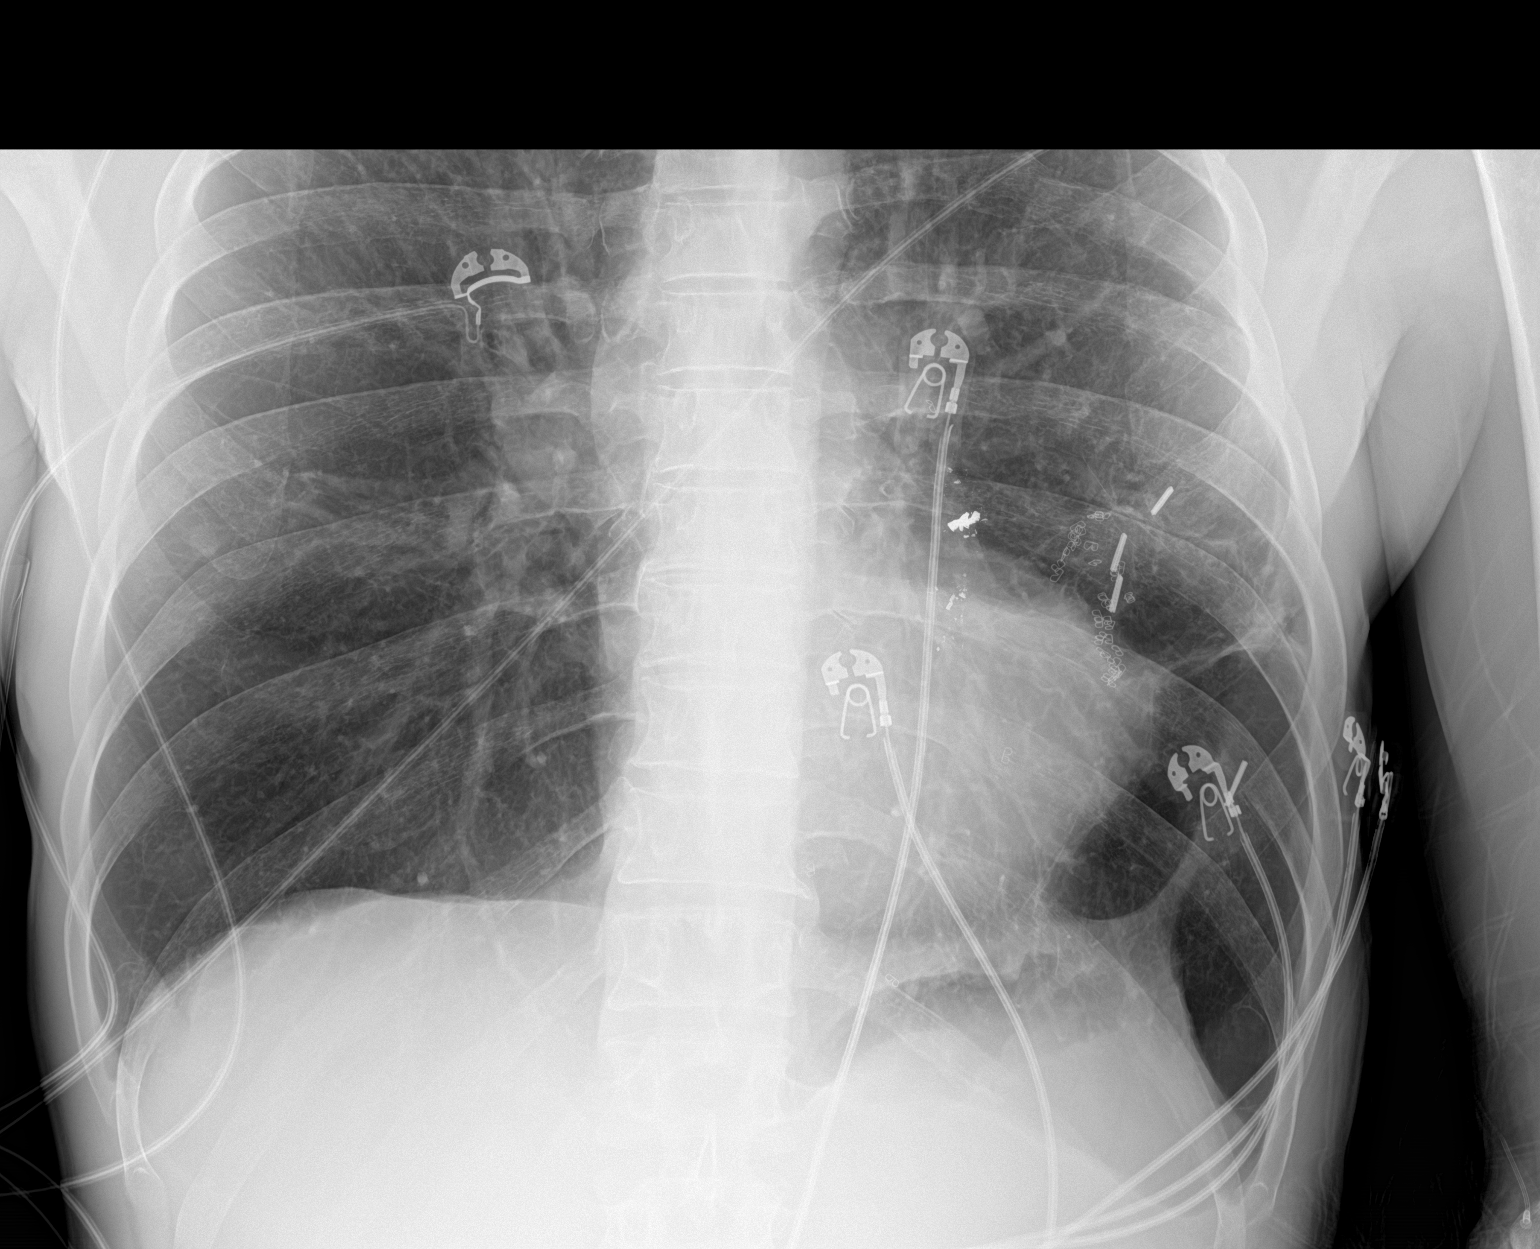

[2 of 2 positions shown; findings below may reference images not displayed]

FINDINGS: Postoperative changes and scarring in the lingula, stable. Mild
hyperinflation compatible with COPD. Heart is normal size. No acute
airspace opacity or effusion. No acute bony abnormality.
IMPRESSION: COPD.  Postoperative scarring in the left base.  No acute findings.

## 2017-10-27 MED ORDER — IPRATROPIUM-ALBUTEROL 0.5-2.5 (3) MG/3ML IN SOLN: 3.0000 mL | RESPIRATORY_TRACT | Status: DC

## 2017-10-27 MED ORDER — DILTIAZEM HCL 100 MG IV SOLR
5.0000 mg/h | Freq: Once | INTRAVENOUS | Status: AC
  Administered 2017-10-27: 5 mg/h via INTRAVENOUS
  Filled 2017-10-27: qty 100

## 2017-10-27 MED ORDER — ACETAMINOPHEN 325 MG PO TABS: 650.0000 mg | ORAL_TABLET | Freq: Four times a day (QID) | ORAL | Status: DC | PRN

## 2017-10-27 MED ORDER — SODIUM CHLORIDE 0.9 % IV SOLN
INTRAVENOUS | Status: DC
  Administered 2017-10-27 – 2017-10-30 (×4): via INTRAVENOUS

## 2017-10-27 MED ORDER — LEVALBUTEROL HCL 0.63 MG/3ML IN NEBU
0.6300 mg | INHALATION_SOLUTION | RESPIRATORY_TRACT | Status: DC | PRN
  Administered 2017-10-27 – 2017-10-29 (×6): 0.63 mg via RESPIRATORY_TRACT
  Filled 2017-10-27 (×6): qty 3

## 2017-10-27 MED ORDER — LEVOFLOXACIN IN D5W 750 MG/150ML IV SOLN
750.0000 mg | INTRAVENOUS | Status: DC
  Administered 2017-10-27 – 2017-10-28 (×2): 750 mg via INTRAVENOUS
  Filled 2017-10-27 (×2): qty 150

## 2017-10-27 MED ORDER — HYDROCODONE-ACETAMINOPHEN 5-325 MG PO TABS
1.0000 | ORAL_TABLET | ORAL | Status: DC | PRN
  Administered 2017-10-27 – 2017-10-29 (×6): 2 via ORAL
  Filled 2017-10-27 (×7): qty 2

## 2017-10-27 MED ORDER — BISACODYL 10 MG RE SUPP: 10.0000 mg | Freq: Every day | RECTAL | Status: DC | PRN

## 2017-10-27 MED ORDER — METOPROLOL TARTRATE 5 MG/5ML IV SOLN
5.0000 mg | Freq: Once | INTRAVENOUS | Status: AC
  Administered 2017-10-27: 5 mg via INTRAVENOUS
  Filled 2017-10-27: qty 5

## 2017-10-27 MED ORDER — ACETAMINOPHEN 650 MG RE SUPP: 650.0000 mg | Freq: Four times a day (QID) | RECTAL | Status: DC | PRN

## 2017-10-27 MED ORDER — TRAZODONE HCL 50 MG PO TABS
25.0000 mg | ORAL_TABLET | Freq: Every evening | ORAL | Status: DC | PRN
  Administered 2017-10-27: 25 mg via ORAL
  Filled 2017-10-27: qty 1

## 2017-10-27 MED ORDER — HEPARIN BOLUS VIA INFUSION
2000.0000 [IU] | Freq: Once | INTRAVENOUS | Status: AC
  Administered 2017-10-27: 2000 [IU] via INTRAVENOUS
  Filled 2017-10-27: qty 2000

## 2017-10-27 MED ORDER — DILTIAZEM HCL 100 MG IV SOLR
5.0000 mg/h | INTRAVENOUS | Status: DC
  Administered 2017-10-27: 5 mg/h via INTRAVENOUS
  Filled 2017-10-27: qty 100

## 2017-10-27 MED ORDER — LORAZEPAM 2 MG/ML IJ SOLN
1.0000 mg | INTRAMUSCULAR | Status: DC | PRN
  Administered 2017-10-27 – 2017-10-29 (×8): 1 mg via INTRAVENOUS
  Filled 2017-10-27 (×8): qty 1

## 2017-10-27 MED ORDER — SODIUM CHLORIDE 0.9 % IV BOLUS (SEPSIS)
1000.0000 mL | Freq: Once | INTRAVENOUS | Status: AC
  Administered 2017-10-27: 1000 mL via INTRAVENOUS

## 2017-10-27 MED ORDER — ONDANSETRON HCL 4 MG PO TABS: 4.0000 mg | ORAL_TABLET | Freq: Four times a day (QID) | ORAL | Status: DC | PRN

## 2017-10-27 MED ORDER — HEPARIN (PORCINE) IN NACL 100-0.45 UNIT/ML-% IJ SOLN
1500.0000 [IU]/h | INTRAMUSCULAR | Status: DC
  Administered 2017-10-27: 1000 [IU]/h via INTRAVENOUS
  Administered 2017-10-28: 1500 [IU]/h via INTRAVENOUS
  Filled 2017-10-27 (×2): qty 250

## 2017-10-27 MED ORDER — HEPARIN BOLUS VIA INFUSION
4000.0000 [IU] | Freq: Once | INTRAVENOUS | Status: AC
  Administered 2017-10-27: 4000 [IU] via INTRAVENOUS
  Filled 2017-10-27: qty 4000

## 2017-10-27 MED ORDER — ONDANSETRON HCL 4 MG/2ML IJ SOLN: 4.0000 mg | Freq: Four times a day (QID) | INTRAMUSCULAR | Status: DC | PRN

## 2017-10-27 MED ORDER — SENNOSIDES-DOCUSATE SODIUM 8.6-50 MG PO TABS: 1.0000 | ORAL_TABLET | Freq: Every evening | ORAL | Status: DC | PRN

## 2017-10-27 MED ORDER — DILTIAZEM HCL 25 MG/5ML IV SOLN
20.0000 mg | Freq: Once | INTRAVENOUS | Status: AC
  Administered 2017-10-27: 20 mg via INTRAVENOUS
  Filled 2017-10-27: qty 5

## 2017-10-27 MED ORDER — PANTOPRAZOLE SODIUM 40 MG PO TBEC
40.0000 mg | DELAYED_RELEASE_TABLET | Freq: Every day | ORAL | Status: DC
Start: 2017-10-28 — End: 2017-10-30
  Administered 2017-10-28 – 2017-10-30 (×3): 40 mg via ORAL
  Filled 2017-10-27 (×3): qty 1

## 2017-10-27 MED ORDER — ACETAMINOPHEN 500 MG PO TABS
1000.0000 mg | ORAL_TABLET | Freq: Once | ORAL | Status: AC
  Administered 2017-10-27: 1000 mg via ORAL
  Filled 2017-10-27: qty 2

## 2017-10-27 NOTE — ED Notes (Signed)
ED Provider at bedside. 
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
## 2017-10-27 NOTE — Progress Notes (Signed)
Pt anxious and stated he was on the verge of a panic attack. CIWA done per orders. Score of 21. Resulst paged to on call Hospitalist. Order for IV ativan q4 prn. Medication given per orders. Pt now sleeping and had a 15 beat run of vtach. EKG obtained. Hospitalist paged. Order for mag obtained.  Stated he hasn't slept in 4 days. VS stable. Will continue to monitor.
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
## 2017-10-27 NOTE — Progress Notes (Addendum)
ANTICOAGULATION CONSULT NOTE - Follow Up Consult  Pharmacy Consult for heparin Indication: atrial fibrillation  Labs: Recent Labs    10/27/17 1440 10/27/17 1641 10/27/17 2213  HGB 14.8  --   --   HCT 43.2  --   --   PLT 214  --   --   HEPARINUNFRC  --   --  <0.10*  CREATININE 1.12  --   --   TROPONINI  --  <0.03  --     Assessment: 51yo male undetectable on heparin with initial dosing for Afib.  Goal of Therapy:  Heparin level 0.3-0.7 units/ml   Plan:  Will rebolus with heparin 2000 units and increase gtt by 4 units/kg/hr to 1250 units/hr and check level with am labs.  Wynona Neat, PharmD, BCPS  10/27/2017,10:59 PM    ADDENDUM: Heparin level remains below goal (0.12).  Will rebolus with heparin 2000 units and increase gtt by 4 units/kg/hr to 1500 units/hr and check level in 6hr. VB 10/28/2017 5:47 AM
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
## 2017-10-28 ENCOUNTER — Encounter (HOSPITAL_COMMUNITY): Payer: Self-pay | Admitting: Physician Assistant

## 2017-10-28 ENCOUNTER — Observation Stay (HOSPITAL_BASED_OUTPATIENT_CLINIC_OR_DEPARTMENT_OTHER): Payer: Self-pay

## 2017-10-28 DIAGNOSIS — I4891 Unspecified atrial fibrillation: Secondary | ICD-10-CM | POA: Diagnosis present

## 2017-10-28 DIAGNOSIS — R079 Chest pain, unspecified: Secondary | ICD-10-CM

## 2017-10-28 DIAGNOSIS — I480 Paroxysmal atrial fibrillation: Principal | ICD-10-CM

## 2017-10-28 LAB — ECHOCARDIOGRAM COMPLETE
Height: 76 in
WEIGHTICAEL: 2425.6 [oz_av]

## 2017-10-28 LAB — CBC
HEMATOCRIT: 38.6 % — AB (ref 39.0–52.0)
HEMOGLOBIN: 13 g/dL (ref 13.0–17.0)
MCH: 30.4 pg (ref 26.0–34.0)
MCHC: 33.7 g/dL (ref 30.0–36.0)
MCV: 90.2 fL (ref 78.0–100.0)
Platelets: 215 10*3/uL (ref 150–400)
RBC: 4.28 MIL/uL (ref 4.22–5.81)
RDW: 12.4 % (ref 11.5–15.5)
WBC: 8 10*3/uL (ref 4.0–10.5)

## 2017-10-28 LAB — BASIC METABOLIC PANEL
ANION GAP: 8 (ref 5–15)
BUN: 9 mg/dL (ref 6–20)
CHLORIDE: 107 mmol/L (ref 101–111)
CO2: 21 mmol/L — AB (ref 22–32)
Calcium: 8.8 mg/dL — ABNORMAL LOW (ref 8.9–10.3)
Creatinine, Ser: 0.88 mg/dL (ref 0.61–1.24)
GFR calc non Af Amer: >60 mL/min (ref >60)
GLUCOSE: 128 mg/dL — AB (ref 65–99)
POTASSIUM: 4 mmol/L (ref 3.5–5.1)
Sodium: 136 mmol/L (ref 135–145)

## 2017-10-28 LAB — RAPID URINE DRUG SCREEN, HOSP PERFORMED
Amphetamines: POSITIVE — AB
BARBITURATES: NOT DETECTED
Benzodiazepines: NOT DETECTED
COCAINE: POSITIVE — AB
OPIATES: POSITIVE — AB
Tetrahydrocannabinol: NOT DETECTED

## 2017-10-28 LAB — PROTIME-INR
INR: 1.1
PROTHROMBIN TIME: 14.1 s (ref 11.4–15.2)

## 2017-10-28 LAB — HIV ANTIBODY (ROUTINE TESTING W REFLEX): HIV SCREEN 4TH GENERATION: NONREACTIVE

## 2017-10-28 LAB — HEPARIN LEVEL (UNFRACTIONATED): Heparin Unfractionated: 0.12 IU/mL — ABNORMAL LOW (ref 0.30–0.70)

## 2017-10-28 MED ORDER — HEPARIN BOLUS VIA INFUSION
2000.0000 [IU] | Freq: Once | INTRAVENOUS | Status: AC
  Administered 2017-10-28: 2000 [IU] via INTRAVENOUS
  Filled 2017-10-28: qty 2000

## 2017-10-28 MED ORDER — HEPARIN SODIUM (PORCINE) 5000 UNIT/ML IJ SOLN
5000.0000 [IU] | Freq: Three times a day (TID) | INTRAMUSCULAR | Status: DC
  Administered 2017-10-29 – 2017-10-30 (×2): 5000 [IU] via SUBCUTANEOUS
  Filled 2017-10-28 (×2): qty 1

## 2017-10-28 MED ORDER — DILTIAZEM HCL ER COATED BEADS 120 MG PO CP24
120.0000 mg | ORAL_CAPSULE | Freq: Every day | ORAL | Status: DC
  Administered 2017-10-28 – 2017-10-30 (×3): 120 mg via ORAL
  Filled 2017-10-28 (×3): qty 1

## 2017-10-28 MED ORDER — ENSURE ENLIVE PO LIQD
237.0000 mL | Freq: Two times a day (BID) | ORAL | Status: DC
  Administered 2017-10-28 – 2017-10-30 (×4): 237 mL via ORAL

## 2017-10-28 NOTE — Progress Notes (Signed)
Initial Nutrition Assessment  DOCUMENTATION CODES:   Severe malnutrition in context of chronic illness, Underweight  INTERVENTION:   Ensure Enlive po BID, each supplement provides 350 kcal and 20 grams of protein  NUTRITION DIAGNOSIS:   Severe Malnutrition related to chronic illness(COPD) as evidenced by severe muscle depletion, percent weight loss(8.5% weight loss within 3 months).  GOAL:   Patient will meet greater than or equal to 90% of their needs  MONITOR:   PO intake, Supplement acceptance  REASON FOR ASSESSMENT:   Malnutrition Screening Tool    ASSESSMENT:   51 yo male with PMH of adult ADHD, asthma, bipolar D/O, COPD, emphysema, who was admitted on 12/16 with SOB and new onset A fib. He has been out of his medications (inhaler and nebulizer) for a month.  Patient reports a good appetite and eating well PTA, but he's lost weight due to stress. 8.5% weight loss within the past 3 months is significant for the time frame.  Intake since admission has been excellent, patient is consuming 100% of meals. Labs and medications reviewed.  NUTRITION - FOCUSED PHYSICAL EXAM:    Most Recent Value  Orbital Region  Mild depletion  Upper Arm Region  Moderate depletion  Thoracic and Lumbar Region  Mild depletion  Buccal Region  Moderate depletion  Temple Region  Severe depletion  Clavicle Bone Region  Severe depletion  Clavicle and Acromion Bone Region  Severe depletion  Scapular Bone Region  Mild depletion  Dorsal Hand  No depletion  Patellar Region  Unable to assess  Anterior Thigh Region  Unable to assess  Posterior Calf Region  Unable to assess  Edema (RD Assessment)  Unable to assess  Hair  Reviewed  Eyes  Reviewed  Mouth  Reviewed  Skin  Reviewed  Nails  Reviewed       Diet Order:  Diet regular Room service appropriate? Yes; Fluid consistency: Thin  EDUCATION NEEDS:   No education needs have been identified at this time  Skin:  Skin Assessment: Reviewed  RN Assessment  Last BM:  12/15  Height:   Ht Readings from Last 1 Encounters:  10/27/17 6\' 4"  (1.93 m)    Weight:   Wt Readings from Last 1 Encounters:  10/27/17 151 lb 9.6 oz (68.8 kg)    Ideal Body Weight:  91.8 kg  BMI:  Body mass index is 18.45 kg/m.  Estimated Nutritional Needs:   Kcal:  2100-2300  Protein:  105-120 gm  Fluid:  2.1-2.3 L   Molli Barrows, RD, LDN, CNSC Pager [phone] After Hours Pager [phone]

## 2017-10-28 NOTE — Progress Notes (Signed)
  Echocardiogram 2D Echocardiogram has been performed.  Merrie Roof F 10/28/2017, 4:00 PM
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
## 2017-10-28 NOTE — Plan of Care (Signed)
  Coping: Level of anxiety will decrease. Pt extremely anxious at times. PRN Ativan given per orders. Medication effectively treating pt's anxiety at this time. Pt 02 wnl on 2L [place]. Will continue to monitor and assess.  10/28/2017 0457 - Not Progressing by Mellissa Kohut, RN

## 2017-10-28 NOTE — Care Management Note (Addendum)
Case Management Note  Patient Details  Name: Kenneth Mcdowell MRN: [id number] Date of Birth: 03-03-1966  Subjective/Objective:    New onset Afib, COPD and polysubstance abuse               Action/Plan: Discharge Planning:  NCM spoke to pt and mother at bedside. Pt states he has neb machine at home. Will need [place] letter with $3 copay to be utilized once per year. Appt arranged for [facility] 11/27/16 at 930 am. Referral to Financial Counselor.  Pt may need oxygen, will have Unit RN check walking oxygen sat.   Expected Discharge Date:                 Expected Discharge Plan:  Home/Self Care  In-House Referral:  NA  Discharge planning Services  CM Consult  Post Acute Care Choice:  NA Choice offered to:  NA  DME Arranged:    DME Agency:     HH Arranged:    HH Agency:     Status of Service:  In process, will continue to follow  If discussed at Long Length of Stay Meetings, dates discussed:    Additional Comments:  Erenest Rasher, RN 10/28/2017, 4:51 PM

## 2017-10-28 NOTE — Progress Notes (Addendum)
Patient ID: Kenneth Mcdowell, male   DOB: 1966/08/20, 51 y.o.   MRN: [id number]  PROGRESS NOTE    OSWELL SAY  XNA:[id number] DOB: 1966-08-13 DOA: 10/27/2017  PCP: Arnoldo Morale, MD   Brief Narrative:  51 year old male with history of ADHD, COPD, cocaine abuse (UDS positive for cocaine on this admission), bipolar disorder. Pt presented to ED with worsening shortness of breath for past week or so. Pt also reported associated chest pain. On admission, he was hemodynamically stable. Trop x 3 were negative. The 12 lead EKG showed no acute ischemic changes. CXR showed COPD. Pt then went into A fib and was started on Cardizem drip and heparin drip on admission. He was not a good candidate for cardioversion in ED due to poor resp status.  Assessment & Plan:   New onset Atrial fibrillation, runs of NSVT and PVCs - CHADS vasc score - 0 - Likely related to COPD exacerbation - Currently on Cardizem drip and heparin drip, we may be able to stop heparin drip but will wait cardio recs  - Appreciate cardio recommendations  - Follow up ECHO results   Chest pain - Likely cocaine induced - Trop x 3 negative - No acute ischemic changes seen on 12 lead EKG on admission  Acute COPD exacerbation / Acute respiratory failure with hypoxia  - Stable resp status this am - Continue BD treatment  - Pt started on empiric Levaquin on admission, blood cx not ordered on admission. No acute infectious process seen on CXR. Will stop Levaquin   Polysubstance abuse - Counseled on cessation - UDS positive for cocaine and amphetamines, opiates     DVT prophylaxis: Heparin drip  Code Status: full code  Family Communication: no family at the bedside  Disposition Plan: home once resp status stable    Consultants:   Cardio   Procedures:   None   Antimicrobials:   [place] 12/16 --> 12/17   Subjective: PT reports feeling weak.  Objective: Vitals:   10/28/17 0429 10/28/17 0555 10/28/17 0720  10/28/17 0750  BP: 112/75  109/71 93/64  Pulse: 92 78 74 82  Resp: 16     Temp: 97.9 F (36.6 C)     TempSrc: Oral     SpO2: 100% 100% 100% 97%  Weight:      Height:        Intake/Output Summary (Last 24 hours) at 10/28/2017 0829 Last data filed at 10/28/2017 0755 Gross per 24 hour  Intake 917.5 ml  Output 1525 ml  Net -607.5 ml   Filed Weights   10/27/17 1441 10/27/17 1743  Weight: 72.6 kg (160 lb) 68.8 kg (151 lb 9.6 oz)    Examination:  General exam: Appears calm and comfortable  Respiratory system: Coarse breath sounds, wheezing in upper lung lobes Cardiovascular system: S1 & S2 heard, Rate controlled   Gastrointestinal system: Abdomen is nondistended, soft and nontender. No organomegaly or masses felt. Normal bowel sounds heard. Central nervous system: Alert and oriented. No focal neurological deficits. Extremities: Symmetric 5 x 5 power. Skin: No rashes, lesions or ulcers Psychiatry: Judgement and insight appear normal. Mood & affect appropriate.   Data Reviewed: I have personally reviewed following labs and imaging studies  CBC: Recent Labs  Lab 10/27/17 1440 10/28/17 0342  WBC 11.8* 8.0  HGB 14.8 13.0  HCT 43.2 38.6*  MCV 90.8 90.2  PLT 214 542   Basic Metabolic Panel: Recent Labs  Lab 10/27/17 1440 10/27/17 2213 10/28/17 0342  NA 139  --  136  K 3.7  --  4.0  CL 104  --  107  CO2 23  --  21*  GLUCOSE 138*  --  128*  BUN 9  --  9  CREATININE 1.12  --  0.88  CALCIUM 9.1  --  8.8*  MG  --  2.0  --    GFR: Estimated Creatinine Clearance: 97.7 mL/min (by C-G formula based on SCr of 0.88 mg/dL). Liver Function Tests: No results for input(s): AST, ALT, ALKPHOS, BILITOT, PROT, ALBUMIN in the last 168 hours. No results for input(s): LIPASE, AMYLASE in the last 168 hours. No results for input(s): AMMONIA in the last 168 hours. Coagulation Profile: Recent Labs  Lab 10/28/17 0342  INR 1.10   Cardiac Enzymes: Recent Labs  Lab 10/27/17 1641  10/27/17 2213  TROPONINI <0.03 <0.03   BNP (last 3 results) No results for input(s): PROBNP in the last 8760 hours. HbA1C: No results for input(s): HGBA1C in the last 72 hours. CBG: No results for input(s): GLUCAP in the last 168 hours. Lipid Profile: No results for input(s): CHOL, HDL, LDLCALC, TRIG, CHOLHDL, LDLDIRECT in the last 72 hours. Thyroid Function Tests: Recent Labs    10/27/17 1659  TSH 0.538   Anemia Panel: No results for input(s): VITAMINB12, FOLATE, FERRITIN, TIBC, IRON, RETICCTPCT in the last 72 hours. Urine analysis:    Component Value Date/Time   COLORURINE YELLOW 01/05/2017 1306   APPEARANCEUR CLEAR 01/05/2017 1306   LABSPEC 1.008 01/05/2017 1306   PHURINE 6.0 01/05/2017 1306   GLUCOSEU NEGATIVE 01/05/2017 1306   HGBUR NEGATIVE 01/05/2017 1306   BILIRUBINUR NEGATIVE 01/05/2017 1306   KETONESUR NEGATIVE 01/05/2017 1306   PROTEINUR NEGATIVE 01/05/2017 1306   UROBILINOGEN 1.0 06/08/2015 0616   NITRITE NEGATIVE 01/05/2017 1306   LEUKOCYTESUR NEGATIVE 01/05/2017 1306   Sepsis Labs: @LABRCNTIP (procalcitonin:4,lacticidven:4)   Recent Results (from the past 240 hour(s))  MRSA PCR Screening     Status: None   Collection Time: 10/27/17  6:24 PM  Result Value Ref Range Status   MRSA by PCR NEGATIVE NEGATIVE Final      Radiology Studies: Dg Chest Port 1 View  Result Date: 10/27/2017 CLINICAL DATA:  Shortness of Breath EXAM: PORTABLE CHEST 1 VIEW COMPARISON:  09/11/2017 FINDINGS: Postoperative changes and scarring in the lingula, stable. Mild hyperinflation compatible with COPD. Heart is normal size. No acute airspace opacity or effusion. No acute bony abnormality. IMPRESSION: COPD.  Postoperative scarring in the left base.  No acute findings. Electronically Signed   By: Rolm Baptise M.D.   On: 10/27/2017 15:33      Scheduled Meds: . pantoprazole  40 mg Oral Q0600   Continuous Infusions: . sodium chloride 100 mL/hr at 10/27/17 1813  . diltiazem  (CARDIZEM) infusion 5 mg/hr (10/27/17 1658)  . heparin 1,500 Units/hr (10/28/17 0754)  . levofloxacin (LEVAQUIN) IV Stopped (10/27/17 1955)     LOS: 0 days    Time spent: 25 minutes  Greater than 50% of the time spent on counseling and coordinating the care.   Leisa Lenz, MD [facility] Pager [phone]  If 7PM-7AM, please contact night-coverage [email or web address] Password [email or web address] 10/28/2017, 8:29 AM

## 2017-10-29 LAB — CBC
HEMATOCRIT: 38.7 % — AB (ref 39.0–52.0)
Hemoglobin: 12.8 g/dL — ABNORMAL LOW (ref 13.0–17.0)
MCH: 30.3 pg (ref 26.0–34.0)
MCHC: 33.1 g/dL (ref 30.0–36.0)
MCV: 91.5 fL (ref 78.0–100.0)
PLATELETS: 188 10*3/uL (ref 150–400)
RBC: 4.23 MIL/uL (ref 4.22–5.81)
RDW: 12.6 % (ref 11.5–15.5)
WBC: 7 10*3/uL (ref 4.0–10.5)

## 2017-10-29 MED ORDER — LEVALBUTEROL HCL 0.63 MG/3ML IN NEBU
0.6300 mg | INHALATION_SOLUTION | Freq: Four times a day (QID) | RESPIRATORY_TRACT | Status: DC
  Administered 2017-10-29 – 2017-10-30 (×4): 0.63 mg via RESPIRATORY_TRACT
  Filled 2017-10-29 (×4): qty 3

## 2017-10-29 MED ORDER — IPRATROPIUM BROMIDE 0.02 % IN SOLN
0.5000 mg | Freq: Four times a day (QID) | RESPIRATORY_TRACT | Status: DC
  Administered 2017-10-29 – 2017-10-30 (×4): 0.5 mg via RESPIRATORY_TRACT
  Filled 2017-10-29 (×4): qty 2.5

## 2017-10-29 MED ORDER — LEVOFLOXACIN 750 MG PO TABS
750.0000 mg | ORAL_TABLET | Freq: Every day | ORAL | Status: DC
Start: 2017-10-29 — End: 2017-10-30
  Administered 2017-10-29: 750 mg via ORAL
  Filled 2017-10-29: qty 1

## 2017-10-29 MED ORDER — METHYLPREDNISOLONE SODIUM SUCC 125 MG IJ SOLR
60.0000 mg | Freq: Four times a day (QID) | INTRAMUSCULAR | Status: DC
  Administered 2017-10-29 – 2017-10-30 (×4): 60 mg via INTRAVENOUS
  Filled 2017-10-29 (×4): qty 2

## 2017-10-29 MED ORDER — NICOTINE 21 MG/24HR TD PT24
21.0000 mg | MEDICATED_PATCH | Freq: Every day | TRANSDERMAL | Status: DC
  Filled 2017-10-29: qty 1

## 2017-10-29 NOTE — Plan of Care (Signed)
  Clinical Measurements: Ability to maintain clinical measurements within normal limits will improve. Pt O2 saturation at 96% on room air. Pt had one anxiety attack last night and requested PRN anxiety medication. Pt currently resting with no other needs or concerns at this time. Will continue to monitor and assess.   10/29/2017 0538 - Progressing by Mellissa Kohut, RN

## 2017-10-29 NOTE — Progress Notes (Signed)
Patient ID: Kenneth Mcdowell, male   DOB: December 06, 1965, 51 y.o.   MRN: [id number]  PROGRESS NOTE    Kenneth Mcdowell  CVE:[id number] DOB: 02-Oct-1966 DOA: 10/27/2017  PCP: Arnoldo Morale, MD   Brief Narrative:  51 year old male with history of ADHD, COPD, cocaine abuse (UDS positive for cocaine on this admission), bipolar disorder. Pt presented to ED with worsening shortness of breath for past week or so. Pt also reported associated chest pain. On admission, he was hemodynamically stable. Trop x 3 were negative. The 12 lead EKG showed no acute ischemic changes. CXR showed COPD. Pt then went into A fib and was started on Cardizem drip and heparin drip on admission. He was not a good candidate for cardioversion in ED due to poor resp status.  Assessment & Plan:   New onset Atrial fibrillation, runs of NSVT and PVCs - CHADS vasc score - 0 - Likely related to COPD exacerbation - now on oral Cardizem. No anticoagulation.  - Appreciate cardio recommendations  - ECHO normal EF  Chest pain - Likely cocaine induced - Trop x 3 negative - No acute ischemic changes seen on 12 lead EKG on admission  Acute COPD exacerbation / Acute respiratory failure with hypoxia  - start IV solumedrol, xopenex, and ipratropium,  -on Levaquin.   Polysubstance abuse - Counseled on cessation - UDS positive for cocaine and amphetamines, opiates     DVT prophylaxis: heparin  Code Status: full code  Family Communication: no family at the bedside  Disposition Plan: home once resp status stable    Consultants:   Cardio   Procedures:   None   Antimicrobials:   Levaquin 12/16 --> 12/17   Subjective: He report worsening dyspnea.   Objective: Vitals:   10/29/17 0252 10/29/17 0933 10/29/17 1052 10/29/17 1456  BP: 126/81 (!) 106/95 123/87 122/82  Pulse: 82  82   Resp: 14     Temp: 97.6 F (36.4 C)   98.6 F (37 C)  TempSrc: Oral   Oral  SpO2: 96%  94% 95%  Weight:      Height:         Intake/Output Summary (Last 24 hours) at 10/29/2017 1600 Last data filed at 10/29/2017 1456 Gross per 24 hour  Intake 240 ml  Output 2700 ml  Net -2460 ml   Filed Weights   10/27/17 1441 10/27/17 1743  Weight: 72.6 kg (160 lb) 68.8 kg (151 lb 9.6 oz)    Examination:  General exam: NAD,  Respiratory system: Bilateral wheezing.  Cardiovascular system: S 1, S 2 IRR Gastrointestinal system: BS present, soft, nt Central nervous system: Alert.  Extremities: symmetric power.   Data Reviewed: I have personally reviewed following labs and imaging studies  CBC: Recent Labs  Lab 10/27/17 1440 10/28/17 0342 10/29/17 0541  WBC 11.8* 8.0 7.0  HGB 14.8 13.0 12.8*  HCT 43.2 38.6* 38.7*  MCV 90.8 90.2 91.5  PLT 214 215 025   Basic Metabolic Panel: Recent Labs  Lab 10/27/17 1440 10/27/17 2213 10/28/17 0342  NA 139  --  136  K 3.7  --  4.0  CL 104  --  107  CO2 23  --  21*  GLUCOSE 138*  --  128*  BUN 9  --  9  CREATININE 1.12  --  0.88  CALCIUM 9.1  --  8.8*  MG  --  2.0  --    GFR: Estimated Creatinine Clearance: 97.7 mL/min (by C-G formula based  on SCr of 0.88 mg/dL). Liver Function Tests: No results for input(s): AST, ALT, ALKPHOS, BILITOT, PROT, ALBUMIN in the last 168 hours. No results for input(s): LIPASE, AMYLASE in the last 168 hours. No results for input(s): AMMONIA in the last 168 hours. Coagulation Profile: Recent Labs  Lab 10/28/17 0342  INR 1.10   Cardiac Enzymes: Recent Labs  Lab 10/27/17 1641 10/27/17 2213  TROPONINI <0.03 <0.03   BNP (last 3 results) No results for input(s): PROBNP in the last 8760 hours. HbA1C: No results for input(s): HGBA1C in the last 72 hours. CBG: No results for input(s): GLUCAP in the last 168 hours. Lipid Profile: No results for input(s): CHOL, HDL, LDLCALC, TRIG, CHOLHDL, LDLDIRECT in the last 72 hours. Thyroid Function Tests: Recent Labs    10/27/17 1659  TSH 0.538   Anemia Panel: No results for  input(s): VITAMINB12, FOLATE, FERRITIN, TIBC, IRON, RETICCTPCT in the last 72 hours. Urine analysis:    Component Value Date/Time   COLORURINE YELLOW 01/05/2017 1306   APPEARANCEUR CLEAR 01/05/2017 1306   LABSPEC 1.008 01/05/2017 1306   PHURINE 6.0 01/05/2017 1306   GLUCOSEU NEGATIVE 01/05/2017 1306   HGBUR NEGATIVE 01/05/2017 1306   BILIRUBINUR NEGATIVE 01/05/2017 1306   KETONESUR NEGATIVE 01/05/2017 1306   PROTEINUR NEGATIVE 01/05/2017 1306   UROBILINOGEN 1.0 06/08/2015 0616   NITRITE NEGATIVE 01/05/2017 1306   LEUKOCYTESUR NEGATIVE 01/05/2017 1306   Sepsis Labs: @LABRCNTIP (procalcitonin:4,lacticidven:4)   Recent Results (from the past 240 hour(s))  MRSA PCR Screening     Status: None   Collection Time: 10/27/17  6:24 PM  Result Value Ref Range Status   MRSA by PCR NEGATIVE NEGATIVE Final      Radiology Studies: Dg Chest Port 1 View  Result Date: 10/27/2017 CLINICAL DATA:  Shortness of Breath EXAM: PORTABLE CHEST 1 VIEW COMPARISON:  09/11/2017 FINDINGS: Postoperative changes and scarring in the lingula, stable. Mild hyperinflation compatible with COPD. Heart is normal size. No acute airspace opacity or effusion. No acute bony abnormality. IMPRESSION: COPD.  Postoperative scarring in the left base.  No acute findings. Electronically Signed   By: Rolm Baptise M.D.   On: 10/27/2017 15:33      Scheduled Meds: . diltiazem  120 mg Oral Daily  . feeding supplement (ENSURE ENLIVE)  237 mL Oral BID BM  . heparin injection (subcutaneous)  5,000 Units Subcutaneous Q8H  . ipratropium  0.5 mg Nebulization Q6H  . levalbuterol  0.63 mg Nebulization Q6H  . levofloxacin  750 mg Oral q1800  . methylPREDNISolone (SOLU-MEDROL) injection  60 mg Intravenous Q6H  . nicotine  21 mg Transdermal Daily  . pantoprazole  40 mg Oral Q0600   Continuous Infusions: . sodium chloride 75 mL/hr at 10/29/17 1204     LOS: 1 day    Time spent: 25 minutes  Greater than 50% of the time spent on  counseling and coordinating the care.   Elmarie Shiley, MD [facility] Pager [phone]  If 7PM-7AM, please contact night-coverage [email or web address] Password [email or web address] 10/29/2017, 4:00 PM

## 2017-10-30 DIAGNOSIS — J9601 Acute respiratory failure with hypoxia: Secondary | ICD-10-CM

## 2017-10-30 LAB — CBC
HEMATOCRIT: 41.7 % (ref 39.0–52.0)
Hemoglobin: 14.7 g/dL (ref 13.0–17.0)
MCH: 31.5 pg (ref 26.0–34.0)
MCHC: 35.3 g/dL (ref 30.0–36.0)
MCV: 89.5 fL (ref 78.0–100.0)
PLATELETS: 235 10*3/uL (ref 150–400)
RBC: 4.66 MIL/uL (ref 4.22–5.81)
RDW: 12.2 % (ref 11.5–15.5)
WBC: 9.7 10*3/uL (ref 4.0–10.5)

## 2017-10-30 LAB — VITAMIN B12: Vitamin B-12: 287 pg/mL (ref 180–914)

## 2017-10-30 MED ORDER — ACETAMINOPHEN 325 MG PO TABS: 650.0000 mg | ORAL_TABLET | Freq: Four times a day (QID) | ORAL | 0 refills | Status: DC | PRN

## 2017-10-30 MED ORDER — PREDNISONE 20 MG PO TABS: 40.0000 mg | ORAL_TABLET | Freq: Every day | ORAL | 0 refills | Status: AC

## 2017-10-30 MED ORDER — ENSURE ENLIVE PO LIQD: 237.0000 mL | Freq: Two times a day (BID) | ORAL | 12 refills | Status: DC

## 2017-10-30 MED ORDER — ALBUTEROL SULFATE (2.5 MG/3ML) 0.083% IN NEBU: 2.5000 mg | INHALATION_SOLUTION | RESPIRATORY_TRACT | 1 refills | Status: DC | PRN

## 2017-10-30 MED ORDER — ALBUTEROL SULFATE HFA 108 (90 BASE) MCG/ACT IN AERS: 2.0000 | INHALATION_SPRAY | RESPIRATORY_TRACT | 1 refills | Status: DC | PRN

## 2017-10-30 MED ORDER — LEVOFLOXACIN 750 MG PO TABS: 750.0000 mg | ORAL_TABLET | Freq: Every day | ORAL | 0 refills | Status: DC

## 2017-10-30 MED ORDER — DILTIAZEM HCL ER COATED BEADS 120 MG PO CP24: 120.0000 mg | ORAL_CAPSULE | Freq: Every day | ORAL | 0 refills | Status: DC

## 2017-10-30 MED ORDER — IPRATROPIUM BROMIDE 0.02 % IN SOLN: 0.5000 mg | Freq: Four times a day (QID) | RESPIRATORY_TRACT | 12 refills | Status: DC

## 2017-10-30 NOTE — Progress Notes (Addendum)
Provided pt with a [place] letter. He can afford $3 copay for meds.   Jonnie Finner RN CCM Case Mgmt phone [phone]   Case Management Note  Patient Details  Name: Kenneth Mcdowell MRN: [id number] Date of Birth: 1966/02/24  Subjective/Objective:    New onset Afib, COPD and polysubstance abuse               Action/Plan: Discharge Planning:  NCM spoke to pt and mother at bedside. Pt states he has neb machine at home. Will need [place] letter with $3 copay to be utilized once per year. Appt arranged for [facility] 11/27/16 at 930 am. Referral to Financial Counselor.  Pt may need oxygen, will have Unit RN check walking oxygen sat.    10/30/2017 Provided pt with MATCH. No oxygen needed for home.    Expected Discharge Date:  10/30/17               Expected Discharge Plan:  Home/Self Care  In-House Referral:  NA  Discharge planning Services  CM Consult, Medication Assistance, [facility], Follow-up appt scheduled  Post Acute Care Choice:  NA Choice offered to:  NA  DME Arranged:  N/A DME Agency:  NA  HH Arranged:  NA HH Agency:  NA  Status of Service:  Completed, signed off  If discussed at [place] of Stay Meetings, dates discussed:    Additional Comments:  Erenest Rasher, RN 10/30/2017, 10:57 AM

## 2017-10-30 NOTE — Discharge Summary (Signed)
Physician Discharge Summary  Kenneth Mcdowell ZDG:[id number] DOB: Sep 20, 1966 DOA: 10/27/2017  PCP: Kenneth Morale, MD  Admit date: 10/27/2017 Discharge date: 10/30/2017  Admitted From: Home  Disposition:  Home   Recommendations for Outpatient Follow-up:  1. Follow up with PCP in 1-2 weeks 2. Please obtain BMP/CBC in one week    Discharge Condition: Stable.  CODE STATUS: DNR Diet recommendation: Heart Healthy  Brief/Interim Summary: 51 year old male with history of ADHD, COPD, cocaine abuse (UDS positive for cocaine on this admission), bipolar disorder. Pt presented to ED with worsening shortness of breath for past week or so. Pt also reported associated chest pain. On admission, he was hemodynamically stable. Trop x 3 were negative. The 12 lead EKG showed no acute ischemic changes. CXR showed COPD. Pt then went into A fib and was started on Cardizem drip and heparin drip on admission. He was not a good candidate for cardioversion in ED due to poor resp status.  Assessment & Plan:   New onset Atrial fibrillation, runs of NSVT and PVCs - CHADS vasc score - 0 - Likely related to COPD exacerbation - now on oral Cardizem. No anticoagulation.  - Appreciate cardio recommendations  - ECHO normal EF  Chest pain - Likely cocaine induced - Trop x 3 negative - No acute ischemic changes seen on 12 lead EKG on admission  Acute COPD exacerbation / Acute respiratory failure with hypoxia   -Treated with  IV solumedrol, xopenex, and ipratropium,  -on Levaquin. discharge on 3 more days.  discharge on prednisone and nebulizer.  Improved.   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)   Bipolar disorder (HCC)   ADHD   Atrial fibrillation with  rapid ventricular response (HCC)   Acute respiratory failure with hypoxia (HCC)   COPD exacerbation (HCC)   Atrial fibrillation with RVR [facility])    Discharge Instructions  Discharge Instructions    Diet - low sodium heart healthy   Complete by:  As directed    Increase activity slowly   Complete by:  As directed      Allergies as of 10/30/2017   No Known Allergies     Medication List    TAKE these medications   acetaminophen 325 MG tablet Commonly known as:  TYLENOL Take 2 tablets (650 mg total) by mouth every 6 (six) hours as needed for mild pain (or Fever >/= 101).   albuterol 108 (90 Base) MCG/ACT inhaler Commonly known as:  PROVENTIL HFA;VENTOLIN HFA Inhale 2 puffs into the lungs every 4 (four) hours as needed for wheezing or shortness of breath.   albuterol (2.5 MG/3ML) 0.083% nebulizer solution Commonly known as:  PROVENTIL Take 3 mLs (2.5 mg total) by nebulization every 4 (four) hours as needed for wheezing or shortness of breath.   diltiazem 120 MG 24 hr capsule Commonly known as:  CARDIZEM CD Take 1 capsule (120 mg total) by mouth daily.   feeding supplement (ENSURE ENLIVE) Liqd Take 237 mLs by mouth 2 (two) times daily between meals.   ipratropium 0.02 % nebulizer solution Commonly known as:  ATROVENT Take 2.5 mLs (0.5 mg total) by nebulization every 6 (six) hours.   levofloxacin 750 MG tablet Commonly known as:  LEVAQUIN Take 1 tablet (750  mg total) by mouth daily at 6 PM.   predniSONE 20 MG tablet Commonly known as:  DELTASONE Take 2 tablets (40 mg total) by mouth daily for 5 days.      Follow-up Information    Kenneth Morale, MD Follow up in 1 week(s).   Specialty:  Family Medicine Contact information: [address] [place] [address] [phone]          No Known Allergies  Consultations:  none   Procedures/Studies: Dg Chest Port 1 View  Result Date: 10/27/2017 CLINICAL DATA:  Shortness of Breath EXAM: PORTABLE CHEST  1 VIEW COMPARISON:  09/11/2017 FINDINGS: Postoperative changes and scarring in the lingula, stable. Mild hyperinflation compatible with COPD. Heart is normal size. No acute airspace opacity or effusion. No acute bony abnormality. IMPRESSION: COPD.  Postoperative scarring in the left base.  No acute findings. Electronically Signed   By: Kenneth Mcdowell M.D.   On: 10/27/2017 15:33      Subjective: He is breathing better.   Discharge Exam: Vitals:   10/30/17 0526 10/30/17 0843  BP: 124/86   Pulse: 78   Resp: 20   Temp: 97.7 F (36.5 C)   SpO2: 95% 93%   Vitals:   10/29/17 2357 10/30/17 0124 10/30/17 0526 10/30/17 0843  BP: 118/81  124/86   Pulse: 85  78   Resp: 16  20   Temp: (!) 97.4 F (36.3 C)  97.7 F (36.5 C)   TempSrc: Oral  Oral   SpO2: 96% 96% 95% 93%  Weight:   68.5 kg (151 lb)   Height:        General: Pt is alert, awake, not in acute distress Cardiovascular: RRR, S1/S2 +, no rubs, no gallops Respiratory: CTA bilaterally, no wheezing, no rhonchi Abdominal: Soft, NT, ND, bowel sounds + Extremities: no edema, no cyanosis    The results of significant diagnostics from this hospitalization (including imaging, microbiology, ancillary and laboratory) are listed below for reference.     Microbiology: Recent Results (from the past 240 hour(s))  MRSA PCR Screening     Status: None   Collection Time: 10/27/17  6:24 PM  Result Value Ref Range Status   MRSA by PCR NEGATIVE NEGATIVE Final    Comment:        The GeneXpert MRSA Assay (FDA approved for NASAL specimens only), is one component of a comprehensive MRSA colonization surveillance program. It is not intended to diagnose MRSA infection nor to guide or monitor treatment for MRSA infections.      Labs: BNP (last 3 results) Recent Labs    05/18/17 0252  BNP 8.7   Basic Metabolic Panel: Recent Labs  Lab 10/27/17 1440 10/27/17 2213 10/28/17 0342  NA 139  --  136  K 3.7  --  4.0  CL 104  --  107   CO2 23  --  21*  GLUCOSE 138*  --  128*  BUN 9  --  9  CREATININE 1.12  --  0.88  CALCIUM 9.1  --  8.8*  MG  --  2.0  --    Liver Function Tests: No results for input(s): AST, ALT, ALKPHOS, BILITOT, PROT, ALBUMIN in the last 168 hours. No results for input(s): LIPASE, AMYLASE in the last 168 hours. No results for input(s): AMMONIA in the last 168 hours. CBC: Recent Labs  Lab 10/27/17 1440 10/28/17 0342 10/29/17 0541 10/30/17 0311  WBC 11.8* 8.0 7.0 9.7  HGB 14.8 13.0 12.8* 14.7  HCT  43.2 38.6* 38.7* 41.7  MCV 90.8 90.2 91.5 89.5  PLT 214 215 188 235   Cardiac Enzymes: Recent Labs  Lab 10/27/17 1641 10/27/17 2213  TROPONINI <0.03 <0.03   BNP: Invalid input(s): POCBNP CBG: No results for input(s): GLUCAP in the last 168 hours. D-Dimer No results for input(s): DDIMER in the last 72 hours. Hgb A1c No results for input(s): HGBA1C in the last 72 hours. Lipid Profile No results for input(s): CHOL, HDL, LDLCALC, TRIG, CHOLHDL, LDLDIRECT in the last 72 hours. Thyroid function studies No results for input(s): TSH, T4TOTAL, T3FREE, THYROIDAB in the last 72 hours.  Invalid input(s): FREET3 Anemia work up Recent Labs    10/30/17 0952  VITAMINB12 287   Urinalysis    Component Value Date/Time   COLORURINE YELLOW 01/05/2017 [place] 01/05/2017 1306   LABSPEC 1.008 01/05/2017 1306   PHURINE 6.0 01/05/2017 1306   GLUCOSEU NEGATIVE 01/05/2017 1306   HGBUR NEGATIVE 01/05/2017 1306   BILIRUBINUR NEGATIVE 01/05/2017 1306   [place] 01/05/2017 1306   PROTEINUR NEGATIVE 01/05/2017 1306   UROBILINOGEN 1.0 06/08/2015 0616   NITRITE NEGATIVE 01/05/2017 1306   LEUKOCYTESUR NEGATIVE 01/05/2017 1306   Sepsis Labs Invalid input(s): PROCALCITONIN,  WBC,  LACTICIDVEN Microbiology Recent Results (from the past 240 hour(s))  MRSA PCR Screening     Status: None   Collection Time: 10/27/17  6:24 PM  Result Value Ref Range Status   MRSA by PCR NEGATIVE  NEGATIVE Final    Comment:        The GeneXpert MRSA Assay (FDA approved for NASAL specimens only), is one component of a comprehensive MRSA colonization surveillance program. It is not intended to diagnose MRSA infection nor to guide or monitor treatment for MRSA infections.      Time coordinating discharge: Over 30 minutes  SIGNED:   Elmarie Shiley, MD  [facility] 10/30/2017, 5:19 PM Pager [phone]  If 7PM-7AM, please contact night-coverage [email or web address] Password [email or web address]

## 2017-10-31 LAB — VITAMIN D 25 HYDROXY (VIT D DEFICIENCY, FRACTURES): Vit D, 25-Hydroxy: 17.2 ng/mL — ABNORMAL LOW (ref 30.0–100.0)

## 2017-11-27 ENCOUNTER — Ambulatory Visit: Payer: Self-pay | Attending: Critical Care Medicine | Admitting: Critical Care Medicine

## 2017-11-27 ENCOUNTER — Encounter: Payer: Self-pay | Admitting: Critical Care Medicine

## 2017-11-27 VITALS — BP 115/80 | HR 84 | Temp 97.7°F | Resp 15 | Wt 154.2 lb

## 2017-11-27 DIAGNOSIS — F319 Bipolar disorder, unspecified: Secondary | ICD-10-CM | POA: Insufficient documentation

## 2017-11-27 DIAGNOSIS — I472 Ventricular tachycardia: Secondary | ICD-10-CM | POA: Insufficient documentation

## 2017-11-27 DIAGNOSIS — R918 Other nonspecific abnormal finding of lung field: Secondary | ICD-10-CM | POA: Insufficient documentation

## 2017-11-27 DIAGNOSIS — Z79899 Other long term (current) drug therapy: Secondary | ICD-10-CM | POA: Insufficient documentation

## 2017-11-27 DIAGNOSIS — J440 Chronic obstructive pulmonary disease with acute lower respiratory infection: Secondary | ICD-10-CM | POA: Insufficient documentation

## 2017-11-27 DIAGNOSIS — I493 Ventricular premature depolarization: Secondary | ICD-10-CM | POA: Insufficient documentation

## 2017-11-27 DIAGNOSIS — F909 Attention-deficit hyperactivity disorder, unspecified type: Secondary | ICD-10-CM | POA: Insufficient documentation

## 2017-11-27 DIAGNOSIS — F1410 Cocaine abuse, uncomplicated: Secondary | ICD-10-CM | POA: Insufficient documentation

## 2017-11-27 DIAGNOSIS — I4891 Unspecified atrial fibrillation: Secondary | ICD-10-CM | POA: Insufficient documentation

## 2017-11-27 DIAGNOSIS — R634 Abnormal weight loss: Secondary | ICD-10-CM | POA: Insufficient documentation

## 2017-11-27 DIAGNOSIS — Z87891 Personal history of nicotine dependence: Secondary | ICD-10-CM | POA: Insufficient documentation

## 2017-11-27 DIAGNOSIS — R079 Chest pain, unspecified: Secondary | ICD-10-CM | POA: Insufficient documentation

## 2017-11-27 DIAGNOSIS — K219 Gastro-esophageal reflux disease without esophagitis: Secondary | ICD-10-CM | POA: Insufficient documentation

## 2017-11-27 DIAGNOSIS — J449 Chronic obstructive pulmonary disease, unspecified: Secondary | ICD-10-CM

## 2017-11-27 DIAGNOSIS — J9601 Acute respiratory failure with hypoxia: Secondary | ICD-10-CM | POA: Insufficient documentation

## 2017-11-27 DIAGNOSIS — J441 Chronic obstructive pulmonary disease with (acute) exacerbation: Secondary | ICD-10-CM | POA: Insufficient documentation

## 2017-11-27 MED ORDER — ALBUTEROL SULFATE (2.5 MG/3ML) 0.083% IN NEBU: 2.5000 mg | INHALATION_SOLUTION | RESPIRATORY_TRACT | 4 refills | Status: DC | PRN

## 2017-11-27 MED ORDER — FAMOTIDINE 20 MG PO TABS: 40.0000 mg | ORAL_TABLET | Freq: Every day | ORAL | 6 refills | Status: DC

## 2017-11-27 MED ORDER — AZITHROMYCIN 250 MG PO TABS: ORAL_TABLET | ORAL | 0 refills | Status: DC

## 2017-11-27 MED ORDER — PREDNISONE 10 MG PO TABS: ORAL_TABLET | ORAL | 0 refills | Status: DC

## 2017-11-27 MED ORDER — BUDESONIDE-FORMOTEROL FUMARATE 160-4.5 MCG/ACT IN AERO: 2.0000 | INHALATION_SPRAY | Freq: Two times a day (BID) | RESPIRATORY_TRACT | 12 refills | Status: DC

## 2017-11-27 MED ORDER — MOMETASONE FURO-FORMOTEROL FUM 200-5 MCG/ACT IN AERO: 2.0000 | INHALATION_SPRAY | Freq: Two times a day (BID) | RESPIRATORY_TRACT | 4 refills | Status: DC

## 2017-11-27 MED ORDER — DILTIAZEM HCL ER COATED BEADS 120 MG PO CP24: 120.0000 mg | ORAL_CAPSULE | Freq: Every day | ORAL | 6 refills | Status: DC

## 2017-11-27 MED ORDER — ALBUTEROL SULFATE HFA 108 (90 BASE) MCG/ACT IN AERS: 2.0000 | INHALATION_SPRAY | RESPIRATORY_TRACT | 1 refills | Status: DC | PRN

## 2017-11-27 MED FILL — predniSONE 10 MG TABS: 10 | 12 days supply | Qty: 30 | Fill #0

## 2017-11-27 MED FILL — DULERA 200 MCG/5 MCG INH: 200-5 | 30 days supply | Qty: 13 | Fill #0

## 2017-11-27 MED FILL — ?FAMOTIDINE 20 MG TABLET: 20 | 30 days supply | Qty: 60 | Fill #0

## 2017-11-27 MED FILL — !VENTOLIN HFA INHALER: 108 (90 BAS | 25 days supply | Qty: 18 | Fill #0

## 2017-11-27 MED FILL — ?ALBUTEROL SUL 2.5 MG/3 MLS: (2.5 MG/3ML | 5 days supply | Qty: 90 | Fill #0

## 2017-11-27 MED FILL — DILTIAZEM 24HR ER 120 MG CA: 120 | 30 days supply | Qty: 30 | Fill #0

## 2017-11-27 MED FILL — AZITHROMYCIN 250 MG TABLET: 250 | 5 days supply | Qty: 6 | Fill #0

## 2017-11-27 NOTE — Progress Notes (Signed)
Pt states his chest hurts when he breaths

## 2017-11-27 NOTE — Patient Instructions (Addendum)
Start prednisone 10mg  Take 4 for three days 3 for three days 2 for three days 1 for three days and stop Start azithromycin 250mg  Take two once then one daily until gone Start Dulera two puff twice daily Use albuterol in inhaler or nebulizer  as needed  Stay on cardiazem Start pepcid 40mg  daily at bedtime Follow reflux diet, see below Obtain a breathing test, will be scheduled You do not need oxygen Return Feb 27th for followup

## 2017-11-27 NOTE — Assessment & Plan Note (Signed)
Lung mass, not CA based on 2015 evaluation

## 2017-11-27 NOTE — Assessment & Plan Note (Signed)
Copd exacerbation Acute bronchitis and emphysematous components ? Alpha one anti trypsin deficiency No longer smoking Plan Pulse prednisone symbicort two puff bid Prn SABA neb Prn SABA hfa PFTs
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
## 2017-11-27 NOTE — Assessment & Plan Note (Signed)
Weight loss, poor appetite Advanced COPD with hypercatabolism

## 2017-11-27 NOTE — Assessment & Plan Note (Signed)
Ongoing GERD Trial pepcid 40mg  [facility]

## 2017-12-04 ENCOUNTER — Ambulatory Visit: Payer: Self-pay | Attending: Family Medicine

## 2017-12-23 MED FILL — !VENTOLIN HFA INHALER: 108 (90 BAS | 16 days supply | Qty: 18 | Fill #1

## 2018-01-08 ENCOUNTER — Encounter: Payer: Self-pay | Admitting: Critical Care Medicine

## 2018-01-08 ENCOUNTER — Ambulatory Visit: Payer: Self-pay | Attending: Critical Care Medicine | Admitting: Critical Care Medicine

## 2018-01-08 VITALS — BP 117/78 | HR 85 | Temp 98.7°F | Resp 16 | Ht 76.0 in | Wt 163.6 lb

## 2018-01-08 DIAGNOSIS — M25512 Pain in left shoulder: Secondary | ICD-10-CM | POA: Insufficient documentation

## 2018-01-08 DIAGNOSIS — Z79899 Other long term (current) drug therapy: Secondary | ICD-10-CM | POA: Insufficient documentation

## 2018-01-08 DIAGNOSIS — Z87891 Personal history of nicotine dependence: Secondary | ICD-10-CM | POA: Insufficient documentation

## 2018-01-08 DIAGNOSIS — G8929 Other chronic pain: Secondary | ICD-10-CM | POA: Insufficient documentation

## 2018-01-08 DIAGNOSIS — F909 Attention-deficit hyperactivity disorder, unspecified type: Secondary | ICD-10-CM | POA: Insufficient documentation

## 2018-01-08 DIAGNOSIS — J441 Chronic obstructive pulmonary disease with (acute) exacerbation: Secondary | ICD-10-CM | POA: Insufficient documentation

## 2018-01-08 MED ORDER — ACETAMINOPHEN-CODEINE #3 300-30 MG PO TABS: 1.0000 | ORAL_TABLET | ORAL | 0 refills | Status: DC | PRN

## 2018-01-08 MED ORDER — PREDNISONE 10 MG PO TABS: ORAL_TABLET | ORAL | 0 refills | Status: DC

## 2018-01-08 MED FILL — ACETAMINOPHEN/COD #3 TABLET: 300-30 | 5 days supply | Qty: 30 | Fill #0

## 2018-01-08 MED FILL — predniSONE 10 MG TABS: 10 | 12 days supply | Qty: 30 | Fill #0

## 2018-01-08 NOTE — Assessment & Plan Note (Signed)
Recurrent Copd exacerbation Off tobacco products Plan Take 4 for three days 3 for three days 2 for three days 1 for three days and stop Get dulera filled Prn SABA

## 2018-01-08 NOTE — Patient Instructions (Addendum)
Take tylenol #3 1 every 4 hours prn pain left shoulder  30 given  No refills  Alternate with regular tylenol Prednisone 10mg   Take 4 for three days 3 for three days 2 for three days 1 for three days and stop Get the [facility] filled when paper work processed Albuterol as needed Return 4 months
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
## 2018-01-28 ENCOUNTER — Inpatient Hospital Stay (HOSPITAL_COMMUNITY)
Admission: EM | Admit: 2018-01-28 | Discharge: 2018-01-30 | DRG: 192 | Disposition: A | Discharge status: Home / Self Care | Payer: Self-pay | Attending: Internal Medicine | Admitting: Internal Medicine

## 2018-01-28 ENCOUNTER — Other Ambulatory Visit: Payer: Self-pay

## 2018-01-28 ENCOUNTER — Emergency Department (HOSPITAL_COMMUNITY): Payer: Self-pay

## 2018-01-28 ENCOUNTER — Encounter (HOSPITAL_COMMUNITY): Payer: Self-pay | Admitting: General Practice

## 2018-01-28 DIAGNOSIS — Z825 Family history of asthma and other chronic lower respiratory diseases: Secondary | ICD-10-CM

## 2018-01-28 DIAGNOSIS — F319 Bipolar disorder, unspecified: Secondary | ICD-10-CM | POA: Diagnosis present

## 2018-01-28 DIAGNOSIS — R51 Headache: Secondary | ICD-10-CM

## 2018-01-28 DIAGNOSIS — E876 Hypokalemia: Secondary | ICD-10-CM | POA: Diagnosis present

## 2018-01-28 DIAGNOSIS — R519 Headache, unspecified: Secondary | ICD-10-CM | POA: Diagnosis present

## 2018-01-28 DIAGNOSIS — I4891 Unspecified atrial fibrillation: Secondary | ICD-10-CM | POA: Diagnosis present

## 2018-01-28 DIAGNOSIS — Z87828 Personal history of other (healed) physical injury and trauma: Secondary | ICD-10-CM

## 2018-01-28 DIAGNOSIS — F909 Attention-deficit hyperactivity disorder, unspecified type: Secondary | ICD-10-CM | POA: Diagnosis present

## 2018-01-28 DIAGNOSIS — Z86711 Personal history of pulmonary embolism: Secondary | ICD-10-CM

## 2018-01-28 DIAGNOSIS — Z836 Family history of other diseases of the respiratory system: Secondary | ICD-10-CM

## 2018-01-28 DIAGNOSIS — G43909 Migraine, unspecified, not intractable, without status migrainosus: Secondary | ICD-10-CM | POA: Diagnosis present

## 2018-01-28 DIAGNOSIS — Z833 Family history of diabetes mellitus: Secondary | ICD-10-CM

## 2018-01-28 DIAGNOSIS — J441 Chronic obstructive pulmonary disease with (acute) exacerbation: Principal | ICD-10-CM | POA: Diagnosis present

## 2018-01-28 DIAGNOSIS — Z87891 Personal history of nicotine dependence: Secondary | ICD-10-CM

## 2018-01-28 HISTORY — DX: Headache: R51

## 2018-01-28 HISTORY — DX: Dyspnea, unspecified: R06.00

## 2018-01-28 HISTORY — DX: Headache, unspecified: R51.9

## 2018-01-28 LAB — CBC WITH DIFFERENTIAL/PLATELET
Basophils Absolute: 0 10*3/uL (ref 0.0–0.1)
Basophils Relative: 0 %
Eosinophils Absolute: 0.2 10*3/uL (ref 0.0–0.7)
Eosinophils Relative: 2 %
HEMATOCRIT: 41.5 % (ref 39.0–52.0)
Hemoglobin: 13.9 g/dL (ref 13.0–17.0)
LYMPHS PCT: 10 %
Lymphs Abs: 0.9 10*3/uL (ref 0.7–4.0)
MCH: 31.1 pg (ref 26.0–34.0)
MCHC: 33.5 g/dL (ref 30.0–36.0)
MCV: 92.8 fL (ref 78.0–100.0)
MONO ABS: 0.3 10*3/uL (ref 0.1–1.0)
MONOS PCT: 4 %
NEUTROS ABS: 7.8 10*3/uL — AB (ref 1.7–7.7)
Neutrophils Relative %: 84 %
Platelets: 171 10*3/uL (ref 150–400)
RBC: 4.47 MIL/uL (ref 4.22–5.81)
RDW: 13 % (ref 11.5–15.5)
WBC: 9.2 10*3/uL (ref 4.0–10.5)

## 2018-01-28 LAB — COMPREHENSIVE METABOLIC PANEL
ALK PHOS: 44 U/L (ref 38–126)
ALT: 12 U/L — ABNORMAL LOW (ref 17–63)
ANION GAP: 10 (ref 5–15)
AST: 13 U/L — ABNORMAL LOW (ref 15–41)
Albumin: 3.8 g/dL (ref 3.5–5.0)
BILIRUBIN TOTAL: 0.5 mg/dL (ref 0.3–1.2)
BUN: 5 mg/dL — ABNORMAL LOW (ref 6–20)
CALCIUM: 8.7 mg/dL — AB (ref 8.9–10.3)
CO2: 20 mmol/L — ABNORMAL LOW (ref 22–32)
Chloride: 109 mmol/L (ref 101–111)
Creatinine, Ser: 0.98 mg/dL (ref 0.61–1.24)
GFR calc non Af Amer: >60 mL/min (ref >60)
GLUCOSE: 132 mg/dL — AB (ref 65–99)
Potassium: 3.4 mmol/L — ABNORMAL LOW (ref 3.5–5.1)
Sodium: 139 mmol/L (ref 135–145)
TOTAL PROTEIN: 6.2 g/dL — AB (ref 6.5–8.1)

## 2018-01-28 LAB — TROPONIN I: Troponin I: <0.03 ng/mL (ref <0.03)

## 2018-01-28 IMAGING — DX DG CHEST 1V PORT
1 series · 2 of 2 positions shown · non-contrast
Comparison: Most recent comparison [DATE]

CLINICAL DATA: Shortness of breath.

EXAM:
PORTABLE CHEST 1 VIEW

[Series 1: chest · 0.14mm/px · 2 of 2 slices shown]
[im 1/2]
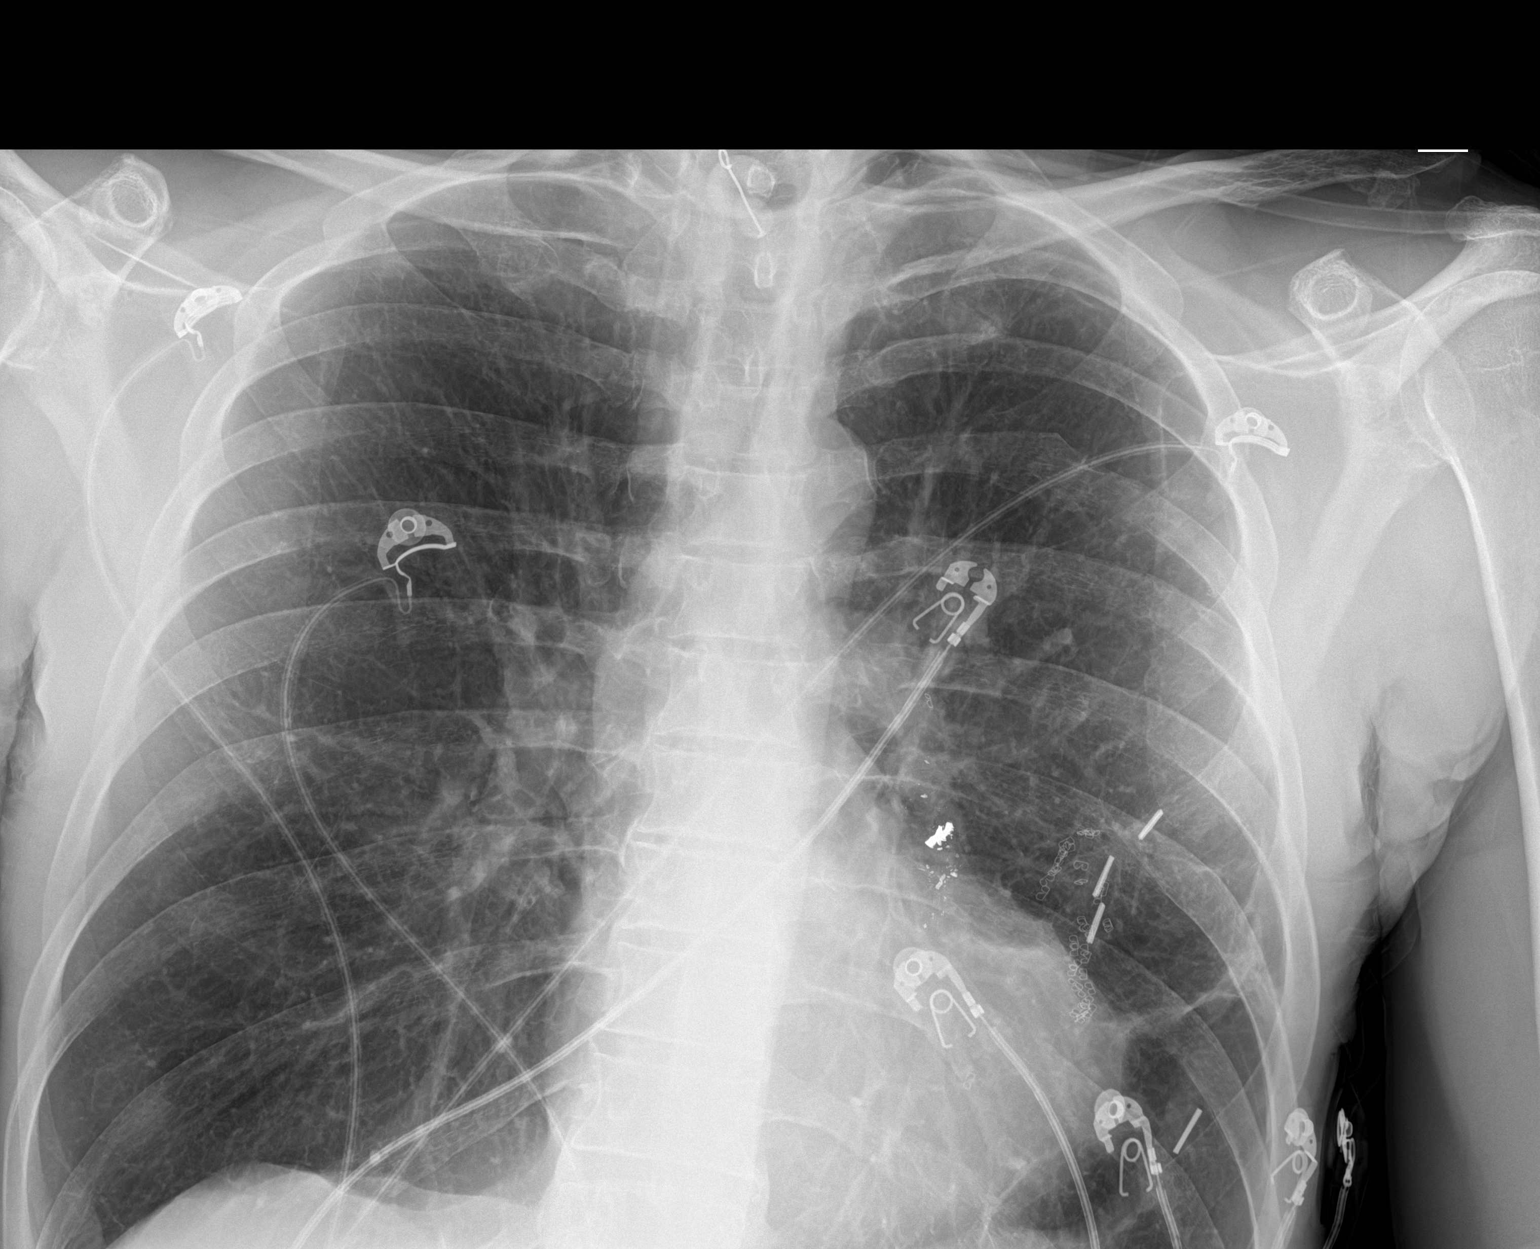
[im 2/2]
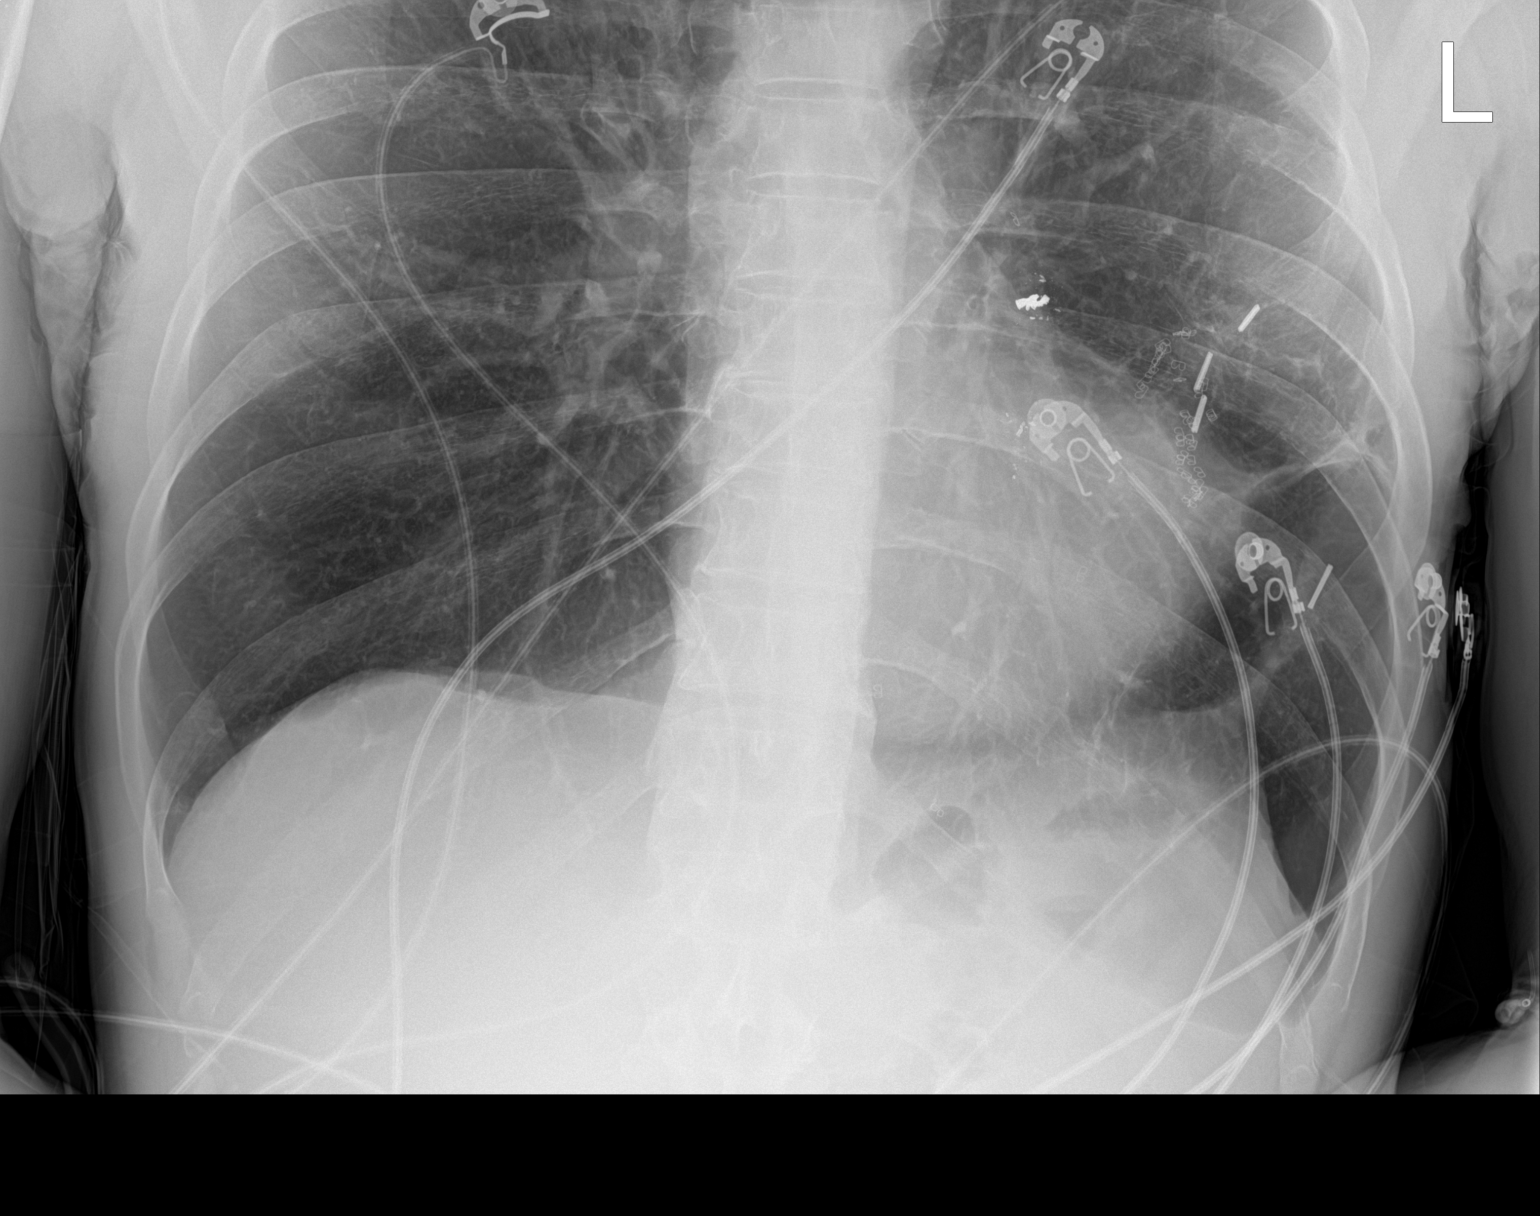

[2 of 2 positions shown; findings below may reference images not displayed]

FINDINGS: Chronic hyperinflation. Chronic scarring and postsurgical change in
the left mid lung. There is a linear metallic foreign body
projecting over the midthoracic inlet measuring 3 cm this in ovoid
hook like configuration at 1 end. It is unclear whether this is
external to the patient or ingested. No evidence pneumomediastinum.
No focal airspace disease, pleural effusion or pneumothorax. Remote
left posterior fifth rib fracture.
IMPRESSION: 1. Linear 3 cm metallic density projecting over the thoracic inlet.
This is presumably external to the patient, recommend correlation
with physical exam. If there is concern for ingested foreign body,
recommend lateral view or soft tissue neck radiographs with removal
of and the external artifacts.
2. Chronic hyperinflation and left lung scarring.

## 2018-01-28 MED ORDER — SODIUM CHLORIDE 0.9 % IV BOLUS (SEPSIS)
500.0000 mL | Freq: Once | INTRAVENOUS | Status: AC
  Administered 2018-01-28: 500 mL via INTRAVENOUS

## 2018-01-28 MED ORDER — LEVOFLOXACIN 500 MG PO TABS: 500.0000 mg | ORAL_TABLET | Freq: Every day | ORAL | Status: DC

## 2018-01-28 MED ORDER — PROCHLORPERAZINE EDISYLATE 5 MG/ML IJ SOLN
10.0000 mg | Freq: Once | INTRAMUSCULAR | Status: AC
  Administered 2018-01-28: 10 mg via INTRAVENOUS
  Filled 2018-01-28: qty 2

## 2018-01-28 MED ORDER — ALBUTEROL (5 MG/ML) CONTINUOUS INHALATION SOLN
INHALATION_SOLUTION | RESPIRATORY_TRACT | Status: AC
  Administered 2018-01-28: 10 mg/h via RESPIRATORY_TRACT
  Filled 2018-01-28: qty 20

## 2018-01-28 MED ORDER — ALBUTEROL (5 MG/ML) CONTINUOUS INHALATION SOLN
10.0000 mg/h | INHALATION_SOLUTION | RESPIRATORY_TRACT | Status: DC
  Administered 2018-01-28: 10 mg/h via RESPIRATORY_TRACT

## 2018-01-28 MED ORDER — KETOROLAC TROMETHAMINE 30 MG/ML IJ SOLN
30.0000 mg | Freq: Once | INTRAMUSCULAR | Status: AC | PRN
  Administered 2018-01-29: 30 mg via INTRAVENOUS
  Filled 2018-01-28: qty 1

## 2018-01-28 MED ORDER — IPRATROPIUM-ALBUTEROL 0.5-2.5 (3) MG/3ML IN SOLN
3.0000 mL | Freq: Four times a day (QID) | RESPIRATORY_TRACT | Status: DC
  Administered 2018-01-28 – 2018-01-30 (×4): 3 mL via RESPIRATORY_TRACT
  Filled 2018-01-28 (×7): qty 3

## 2018-01-28 MED ORDER — ASPIRIN 81 MG PO CHEW
324.0000 mg | CHEWABLE_TABLET | Freq: Once | ORAL | Status: AC
  Administered 2018-01-28: 324 mg via ORAL
  Filled 2018-01-28: qty 4

## 2018-01-28 MED ORDER — KETOROLAC TROMETHAMINE 30 MG/ML IJ SOLN
30.0000 mg | Freq: Once | INTRAMUSCULAR | Status: AC
  Administered 2018-01-28: 30 mg via INTRAVENOUS
  Filled 2018-01-28: qty 1

## 2018-01-28 MED ORDER — AZITHROMYCIN 500 MG PO TABS
500.0000 mg | ORAL_TABLET | Freq: Once | ORAL | Status: AC
  Administered 2018-01-28: 500 mg via ORAL
  Filled 2018-01-28: qty 1

## 2018-01-28 MED ORDER — PREDNISONE 20 MG PO TABS
40.0000 mg | ORAL_TABLET | Freq: Every day | ORAL | Status: DC
  Administered 2018-01-28 – 2018-01-30 (×3): 40 mg via ORAL
  Filled 2018-01-28 (×3): qty 2

## 2018-01-28 MED ORDER — ENOXAPARIN SODIUM 40 MG/0.4ML ~~LOC~~ SOLN
40.0000 mg | SUBCUTANEOUS | Status: DC
  Administered 2018-01-28 – 2018-01-29 (×2): 40 mg via SUBCUTANEOUS
  Filled 2018-01-28 (×2): qty 0.4

## 2018-01-28 MED ORDER — AZITHROMYCIN 500 MG PO TABS
250.0000 mg | ORAL_TABLET | Freq: Every day | ORAL | Status: DC
  Administered 2018-01-29 – 2018-01-30 (×2): 250 mg via ORAL
  Filled 2018-01-28 (×2): qty 1

## 2018-01-28 MED ORDER — ACETAMINOPHEN 325 MG PO TABS
650.0000 mg | ORAL_TABLET | Freq: Four times a day (QID) | ORAL | Status: DC | PRN
  Administered 2018-01-28: 650 mg via ORAL
  Filled 2018-01-28: qty 2

## 2018-01-28 MED ORDER — DIPHENHYDRAMINE HCL 50 MG/ML IJ SOLN
25.0000 mg | Freq: Once | INTRAMUSCULAR | Status: AC
  Administered 2018-01-28: 25 mg via INTRAVENOUS
  Filled 2018-01-28: qty 1

## 2018-01-28 MED ORDER — POTASSIUM CHLORIDE CRYS ER 20 MEQ PO TBCR
40.0000 meq | EXTENDED_RELEASE_TABLET | Freq: Once | ORAL | Status: AC
  Administered 2018-01-28: 40 meq via ORAL
  Filled 2018-01-28: qty 2
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
## 2018-01-28 NOTE — Progress Notes (Signed)
VAHE PIENTA is a 52 y.o. male patient admitted from ED awake, alert - oriented  X 4 - no acute distress noted.  VSS - Blood pressure 130/80, pulse 93, temperature 98 F (36.7 C), temperature source Oral, resp. rate 20, height 6\' 4"  (1.93 m), weight 70.7 kg (155 lb 12.8 oz), SpO2 95 %.    IV in place, occlusive dsg intact without redness.  Orientation to room, and floor completed with information packet given to patient/family.  Patient declined safety video at this time.  Admission INP armband ID verified with patient/family, and in place.   SR up x 2, fall assessment complete, with patient and family able to verbalize understanding of risk associated with falls, and verbalized understanding to call nsg before up out of bed.  Call light within reach, patient able to voice, and demonstrate understanding.  Skin, clean-dry- intact without evidence of bruising, or skin tears.   No evidence of skin break down noted on exam.     Will cont to eval and treat per MD orders.  Luci Bank, RN 01/28/2018 11:22 AM

## 2018-01-28 NOTE — ED Triage Notes (Signed)
Pt BIB [facility] for shortness of breath that began while he was showering. Used his inhaler and took 5 at home nebulizers. EMS administered 125mg  solumedrol, 10mg  albuterol, 1mg  atrovent and 2g magnesium. Pt was on CPAP upon arrival.
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
## 2018-01-29 ENCOUNTER — Telehealth: Payer: Self-pay | Admitting: Family Medicine

## 2018-01-29 DIAGNOSIS — G43909 Migraine, unspecified, not intractable, without status migrainosus: Secondary | ICD-10-CM

## 2018-01-29 DIAGNOSIS — Z7951 Long term (current) use of inhaled steroids: Secondary | ICD-10-CM

## 2018-01-29 LAB — BASIC METABOLIC PANEL
Anion gap: 6 (ref 5–15)
BUN: 12 mg/dL (ref 6–20)
CO2: 24 mmol/L (ref 22–32)
Calcium: 9.3 mg/dL (ref 8.9–10.3)
Chloride: 111 mmol/L (ref 101–111)
Creatinine, Ser: 1.09 mg/dL (ref 0.61–1.24)
GFR calc Af Amer: >60 mL/min (ref >60)
GFR calc non Af Amer: >60 mL/min (ref >60)
Glucose, Bld: 117 mg/dL — ABNORMAL HIGH (ref 65–99)
Potassium: 3.9 mmol/L (ref 3.5–5.1)
Sodium: 141 mmol/L (ref 135–145)

## 2018-01-29 MED ORDER — PROCHLORPERAZINE EDISYLATE 5 MG/ML IJ SOLN
10.0000 mg | Freq: Once | INTRAMUSCULAR | Status: AC
  Administered 2018-01-29: 10 mg via INTRAVENOUS
  Filled 2018-01-29: qty 2

## 2018-01-29 MED ORDER — DIPHENHYDRAMINE HCL 50 MG/ML IJ SOLN
25.0000 mg | Freq: Once | INTRAMUSCULAR | Status: AC
  Administered 2018-01-29: 25 mg via INTRAVENOUS
  Filled 2018-01-29: qty 1

## 2018-01-29 MED ORDER — ASPIRIN-ACETAMINOPHEN-CAFFEINE 250-250-65 MG PO TABS
2.0000 | ORAL_TABLET | Freq: Three times a day (TID) | ORAL | Status: AC | PRN
  Administered 2018-01-29 (×2): 2 via ORAL
  Filled 2018-01-29 (×3): qty 2

## 2018-01-29 NOTE — Telephone Encounter (Signed)
Briefly met with patient during his admission at the hospital. Introduced myself and explained to him the services that the [facility] offers. Patient expressed that he is not interested in scheduling and has no needs for the resources. Explained to patient that he will need to contact our office to schedule his hospital follow up. Patient understood and had no further questions.  °

## 2018-01-29 NOTE — Progress Notes (Addendum)
   Subjective: [name] stated that he is doing well this morning and he feels that his headache and breathing has improved.   Objective:  Vital signs in last 24 hours: Vitals:   01/28/18 0930 01/28/18 1109 01/28/18 2147 01/29/18 0536  BP: 127/87 130/80 129/72 124/78  Pulse: 86 93 94 92  Resp: 15 20 (!) 22 20  Temp:  98 F (36.7 C) 99.2 F (37.3 C) 99.9 F (37.7 C)  TempSrc:  Oral Oral Oral  SpO2: 96% 95% 95% 98%  Weight:  155 lb 12.8 oz (70.7 kg)    Height:  6\' 4"  (1.93 m)     Physical Exam  Constitutional: He appears well-developed and well-nourished. No distress.  HENT:  Head: Normocephalic and atraumatic.  Eyes: Conjunctivae are normal.  Cardiovascular: Normal rate, regular rhythm and normal heart sounds.  Respiratory: Effort normal. No respiratory distress. He has wheezes (mild amount).  GI: Soft. Bowel sounds are normal. He exhibits no distension. There is no tenderness.  Musculoskeletal: He exhibits no edema.  Neurological: He is alert.  Skin: No erythema.  Psychiatric: He has a normal mood and affect. His behavior is normal. Judgment and thought content normal.   Assessment/Plan:  COPD Exacerbation  The patient's breathing has improved and he saturating well on room air. The patient's exacerbation is secondary to running out of maintenance medication.   -Continue prednisone 40mg  qd -Continue duonebs q6hrs -Continue azithromycin 250mg   Migraine Headaches The patient's headaches seem o have decreased in intensity today.   -continue excedrin 2 tablets q8hrs prn -Can use IV compazine 10mg  and IV benadryl 25mg  qd if not controlled with excedrin  Dispo: Anticipated discharge in approximately 1-2 day(s).   Kenneth Mage, MD 01/29/2018, 2:08 PM Pager: [phone]

## 2018-01-29 NOTE — Plan of Care (Signed)
Progressing

## 2018-01-30 LAB — BASIC METABOLIC PANEL
Anion gap: 10 (ref 5–15)
BUN: 16 mg/dL (ref 6–20)
CO2: 25 mmol/L (ref 22–32)
Calcium: 9 mg/dL (ref 8.9–10.3)
Chloride: 103 mmol/L (ref 101–111)
Creatinine, Ser: 1.04 mg/dL (ref 0.61–1.24)
GFR calc Af Amer: >60 mL/min (ref >60)
GFR calc non Af Amer: >60 mL/min (ref >60)
Glucose, Bld: 92 mg/dL (ref 65–99)
Potassium: 3.6 mmol/L (ref 3.5–5.1)
Sodium: 138 mmol/L (ref 135–145)

## 2018-01-30 MED ORDER — PREDNISONE 20 MG PO TABS: 40.0000 mg | ORAL_TABLET | Freq: Every day | ORAL | 0 refills | Status: DC

## 2018-01-30 MED ORDER — AZITHROMYCIN 250 MG PO TABS: 250.0000 mg | ORAL_TABLET | Freq: Every day | ORAL | 0 refills | Status: DC

## 2018-01-30 MED ORDER — MM CETIRIZINE HCL 10 MG PO TABS: 1.0000 | ORAL_TABLET | Freq: Every day | ORAL | 1 refills | Status: DC

## 2018-01-30 MED ORDER — ALBUTEROL SULFATE (2.5 MG/3ML) 0.083% IN NEBU: 2.5000 mg | INHALATION_SOLUTION | RESPIRATORY_TRACT | Status: DC | PRN

## 2018-01-30 MED FILL — predniSONE 20 MG TABS: 20 | 1 days supply | Qty: 2 | Fill #0

## 2018-01-30 MED FILL — AZITHROMYCIN 250 MG TABLET: 250 | 2 days supply | Qty: 2 | Fill #0

## 2018-01-30 NOTE — Discharge Summary (Signed)
Name: Kenneth Mcdowell MRN: [id number] DOB: 1966/01/25 52 y.o. PCP: Charlott Rakes, MD  Date of Admission: 01/28/2018  6:22 AM Date of Discharge: 3/21/20193/21/19 Attending Physician: Dr. Lenice Pressman  Discharge Diagnosis:  Principal Problem:   COPD exacerbation [facility])  Active Problems:   Migraine headache  Discharge Medications: Allergies as of 01/30/2018   No Known Allergies    Allergies as of 01/30/2018   No Known Allergies     Medication List    STOP taking these medications   acetaminophen-codeine 300-30 MG tablet Commonly known as:  TYLENOL #3   diltiazem 120 MG 24 hr capsule Commonly known as:  CARDIZEM CD     TAKE these medications   acetaminophen 325 MG tablet Commonly known as:  TYLENOL Take 2 tablets (650 mg total) by mouth every 6 (six) hours as needed for mild pain (or Fever >/= 101).   albuterol (2.5 MG/3ML) 0.083% nebulizer solution Commonly known as:  PROVENTIL Take 3 mLs (2.5 mg total) by nebulization every 4 (four) hours as needed for wheezing or shortness of breath.   albuterol 108 (90 Base) MCG/ACT inhaler Commonly known as:  PROVENTIL HFA;VENTOLIN HFA Inhale 2 puffs into the lungs every 4 (four) hours as needed for wheezing or shortness of breath.   azithromycin 250 MG tablet Commonly known as:  ZITHROMAX Take 1 tablet (250 mg total) by mouth daily.   famotidine 20 MG tablet Commonly known as:  PEPCID Take 2 tablets (40 mg total) by mouth at bedtime.   MM Cetirizine HCl 10 MG Tabs Take 1 tablet by mouth daily. For allergies   mometasone-formoterol 200-5 MCG/ACT Aero Commonly known as:  DULERA Inhale 2 puffs into the lungs 2 (two) times daily.   predniSONE 20 MG tablet Commonly known as:  DELTASONE Take 2 tablets (40 mg total) by mouth daily with breakfast.         Disposition and follow-up:   [name] was discharged from [facility] in stable condition.  At the hospital follow up visit  please address:  1.   COPD exacerbation: please make sure the patient has completed his azithromycin and prednisone medication. Please also make sure the patient has picked up his dulera and albuterol.  Migraine headaches: please monitor the patient's headache frequently and ensure that they are being adequately controlled. If the patient's headaches occur more frequently then please consider prophylaxis.  2.  Labs / imaging needed at time of follow-up: none  3.  Pending labs/ test needing follow-up: none  Follow-up Appointments: Follow-up Information    [place]. Go on 02/17/2018.   Why:   You have an appointment scheduled with Dr. Margarita Rana 4/8 at 4pm. Please try to re-schedule this appointment for sometime next week.  Contact information: [place] [phone] [place] Hospital Course by problem list:   COPD Exacerbation: The patient presented with dyspnea and productive cough for a few days subsequent to running out of [facility] which he takes on a regular basis. The patient was given prednisone, azithromycin, and Duonebs. The patient was discharged with two days of azithromycin and prednisone. He was also given albuterol inhaler, nebulizer solution and dulera. On day of discharge the patient stated that he was breathing comfortably.   Migraine Headaches: The patient presented with severe headache that are worse with lights. The patient was treated with IV compazine, IV benadryl, and excedrin. The  patient's headaches continued to improve throughout the hospitalization duration. On day of discharge the patient stated that he did not have a headache.   Discharge Vitals:   BP 128/86 (BP Location: Right Arm)   Pulse 68   Temp 97.9 F (36.6 C) (Oral)   Resp 18   Ht 6\' 4"  (1.93 m)   Wt 155 lb 12.8 oz (70.7 kg)   SpO2 97%   BMI 18.96 kg/m   Pertinent Labs, Studies, and Procedures:  CBC Latest Ref Rng &  Units 01/28/2018 10/30/2017 10/29/2017  WBC 4.0 - 10.5 K/uL 9.2 9.7 7.0  Hemoglobin 13.0 - 17.0 g/dL 13.9 14.7 12.8(L)  Hematocrit 39.0 - 52.0 % 41.5 41.7 38.7(L)  Platelets 150 - 400 K/uL 171 235 188   BMP Latest Ref Rng & Units 01/30/2018 01/29/2018 01/28/2018  Glucose 65 - 99 mg/dL 92 117(H) 132(H)  BUN 6 - 20 mg/dL 16 12 5(L)  Creatinine 0.61 - 1.24 mg/dL 1.04 1.09 0.98  Sodium 135 - 145 mmol/L 138 141 139  Potassium 3.5 - 5.1 mmol/L 3.6 3.9 3.4(L)  Chloride 101 - 111 mmol/L 103 111 109  CO2 22 - 32 mmol/L 25 24 20(L)  Calcium 8.9 - 10.3 mg/dL 9.0 9.3 8.7(L)   Chest xray (01/28/18): 1. Linear 3 cm metallic density projecting over the thoracic inlet. This is presumably external to the patient, recommend correlation with physical exam. If there is concern for ingested foreign body, recommend lateral view or soft tissue neck radiographs with removal of and the external artifacts. 2. Chronic hyperinflation and left lung scarring.   Discharge Instructions:   Signed: Lars Mage, MD Pager: [phone]

## 2018-01-30 NOTE — Progress Notes (Signed)
[name] to be D/C'd Home per MD order.  Discussed with the patient and all questions fully answered.  VSS, Skin clean, dry and intact without evidence of skin break down, no evidence of skin tears noted. IV catheter discontinued intact. Site without signs and symptoms of complications. Dressing and pressure applied.  An After Visit Summary was printed and given to the patient. Patient received prescription.  D/c education completed with patient/family including follow up instructions, medication list, d/c activities limitations if indicated, with other d/c instructions as indicated by MD - patient able to verbalize understanding, all questions fully answered.   Patient instructed to return to ED, call 911, or call MD for any changes in condition.   Patient escorted via [place], and D/C home via private auto.  Luci Bank 01/30/2018 11:43 AM

## 2018-01-30 NOTE — Progress Notes (Signed)
   Subjective: Kenneth Mcdowell was seen resting comfortably in his bed this morning. He stated that he feels that his breathing and his headaches have improved.   Objective:  Vital signs in last 24 hours: Vitals:   01/29/18 1555 01/29/18 2111 01/29/18 2127 01/30/18 0536  BP: 125/84 137/82  128/86  Pulse: 96 75  68  Resp: 18 18  18   Temp: 97.8 F (36.6 C) 97.8 F (36.6 C)  97.9 F (36.6 C)  TempSrc:  Oral  Oral  SpO2: 97% 98% 96% 99%  Weight:      Height:       Physical Exam  Constitutional: He appears well-developed and well-nourished. No distress.  HENT:  Head: Normocephalic and atraumatic.  Eyes: Conjunctivae are normal.  Cardiovascular: Normal rate, regular rhythm and normal heart sounds.  Respiratory: Effort normal. No respiratory distress. He has wheezes (mild amount of scattered wheezing, improved from yesterday).  GI: Soft. Bowel sounds are normal. He exhibits no distension. There is no tenderness.  Musculoskeletal: He exhibits no edema.  Neurological: He is alert.  Skin: He is not diaphoretic. No erythema.  Psychiatric: He has a normal mood and affect. His behavior is normal. Judgment and thought content normal.   Assessment/Plan:  COPD Exacerbation  The patient's breathing has improved and he stated that his breathing has improved.  -Continue prednisone 40mg  qd -Continue duonebs q6hrs -Continue azithromycin 250mg   Migraine Headaches The patient's headaches have resolved. The patient does not have enough headaches for him to be placed on prophylactic medication.   -continue excedrin 2 tablets q8hrs prn -Can use IV compazine 10mg  and IV benadryl 25mg  qd if not controlled with excedrin  Dispo: Anticipated discharge in approximately today.   Lars Mage, MD 01/30/2018, 6:57 AM Pager: [phone]

## 2018-01-30 NOTE — Discharge Instructions (Signed)
Kenneth Mcdowell, you were admitted for a COPD exacerbation. You were treated with steroids, IV antibiotics and breathing treatments. We are glad you're feeling better. We've sent in prescriptions to your pharmacy at [facility] for 2 additional days of steroids and antibiotics. I also sent in a refill of your inhalers as well as a medication you can take for allergies.  You have an appointment scheduled with Dr. Margarita Rana 4/8 at 4pm. Please try to move this appointment up to next week for closer follow-up.

## 2018-01-30 NOTE — Care Management Note (Signed)
Case Management Note  Patient Details  Name: Kenneth Mcdowell MRN: [id number] Date of Birth: 03/17/1966  Subjective/Objective:      COPD exacerbation.            Action/Plan: Transition to home. Pt to pick up filled Rx meds from [place] (prednisone/zithromax) @ $4:00 each, cetirizine pt will need to buy over the counter. NCM made pt aware. Pt stated he could afford the expected cost for medication. Hospital f/u appointment scheduled for 02/17/2018 @ 4pm @ the [facility] with PCP, refer to AVS.  Friend to provide transportation to home .  Expected Discharge Date:  01/30/18               Expected Discharge Plan:  Home/Self Care(lives with mom)  In-House Referral:     Discharge planning Services  CM Consult, [place] [facility], [facility]  Post Acute Care Choice:    Choice offered to:     DME Arranged:    DME Agency:     HH Arranged:    HH Agency:     Status of Service:  In process, will continue to follow  If discussed at Long Length of Stay Meetings, dates discussed:    Additional Comments:  Sharin Mons, RN 01/30/2018, 11:51 AM

## 2018-02-17 ENCOUNTER — Ambulatory Visit: Payer: Self-pay | Admitting: Family Medicine

## 2018-02-25 ENCOUNTER — Other Ambulatory Visit: Payer: Self-pay | Admitting: Family Medicine

## 2018-02-25 DIAGNOSIS — J449 Chronic obstructive pulmonary disease, unspecified: Secondary | ICD-10-CM

## 2018-02-26 MED FILL — ALBUTEROL 0.083% INHAL SOLN: (2.5 MG/3ML | 5 days supply | Qty: 90 | Fill #1

## 2018-02-26 MED FILL — $PROVENTIL HFA 90 MCG INHAL: 108 (90 BAS | 16 days supply | Qty: 1 | Fill #0

## 2018-03-15 ENCOUNTER — Other Ambulatory Visit: Payer: Self-pay

## 2018-03-15 ENCOUNTER — Emergency Department (HOSPITAL_COMMUNITY)
Admission: EM | Admit: 2018-03-15 | Discharge: 2018-03-15 | Disposition: A | Discharge status: Home / Self Care | Payer: Self-pay | Attending: Emergency Medicine | Admitting: Emergency Medicine

## 2018-03-15 ENCOUNTER — Emergency Department (HOSPITAL_COMMUNITY): Payer: Self-pay

## 2018-03-15 ENCOUNTER — Encounter (HOSPITAL_COMMUNITY): Payer: Self-pay | Admitting: Emergency Medicine

## 2018-03-15 DIAGNOSIS — J449 Chronic obstructive pulmonary disease, unspecified: Secondary | ICD-10-CM | POA: Insufficient documentation

## 2018-03-15 DIAGNOSIS — Z79899 Other long term (current) drug therapy: Secondary | ICD-10-CM | POA: Insufficient documentation

## 2018-03-15 DIAGNOSIS — F909 Attention-deficit hyperactivity disorder, unspecified type: Secondary | ICD-10-CM | POA: Insufficient documentation

## 2018-03-15 DIAGNOSIS — J441 Chronic obstructive pulmonary disease with (acute) exacerbation: Secondary | ICD-10-CM

## 2018-03-15 DIAGNOSIS — Z87891 Personal history of nicotine dependence: Secondary | ICD-10-CM | POA: Insufficient documentation

## 2018-03-15 LAB — CBC
HCT: 43.7 % (ref 39.0–52.0)
HEMOGLOBIN: 15.1 g/dL (ref 13.0–17.0)
MCH: 31.1 pg (ref 26.0–34.0)
MCHC: 34.6 g/dL (ref 30.0–36.0)
MCV: 90.1 fL (ref 78.0–100.0)
Platelets: 229 10*3/uL (ref 150–400)
RBC: 4.85 MIL/uL (ref 4.22–5.81)
RDW: 12.4 % (ref 11.5–15.5)
WBC: 5.8 10*3/uL (ref 4.0–10.5)

## 2018-03-15 LAB — BASIC METABOLIC PANEL
ANION GAP: 9 (ref 5–15)
BUN: 7 mg/dL (ref 6–20)
CHLORIDE: 104 mmol/L (ref 101–111)
CO2: 26 mmol/L (ref 22–32)
Calcium: 9.4 mg/dL (ref 8.9–10.3)
Creatinine, Ser: 1.16 mg/dL (ref 0.61–1.24)
GFR calc non Af Amer: >60 mL/min (ref >60)
Glucose, Bld: 107 mg/dL — ABNORMAL HIGH (ref 65–99)
Potassium: 3.9 mmol/L (ref 3.5–5.1)
Sodium: 139 mmol/L (ref 135–145)

## 2018-03-15 IMAGING — DX DG CHEST 2V
2 series · 2 of 2 positions shown · non-contrast
Comparison: [DATE]

CLINICAL DATA: Emphysema and shortness of breath.

EXAM:
CHEST - 2 VIEW

[chest pa]
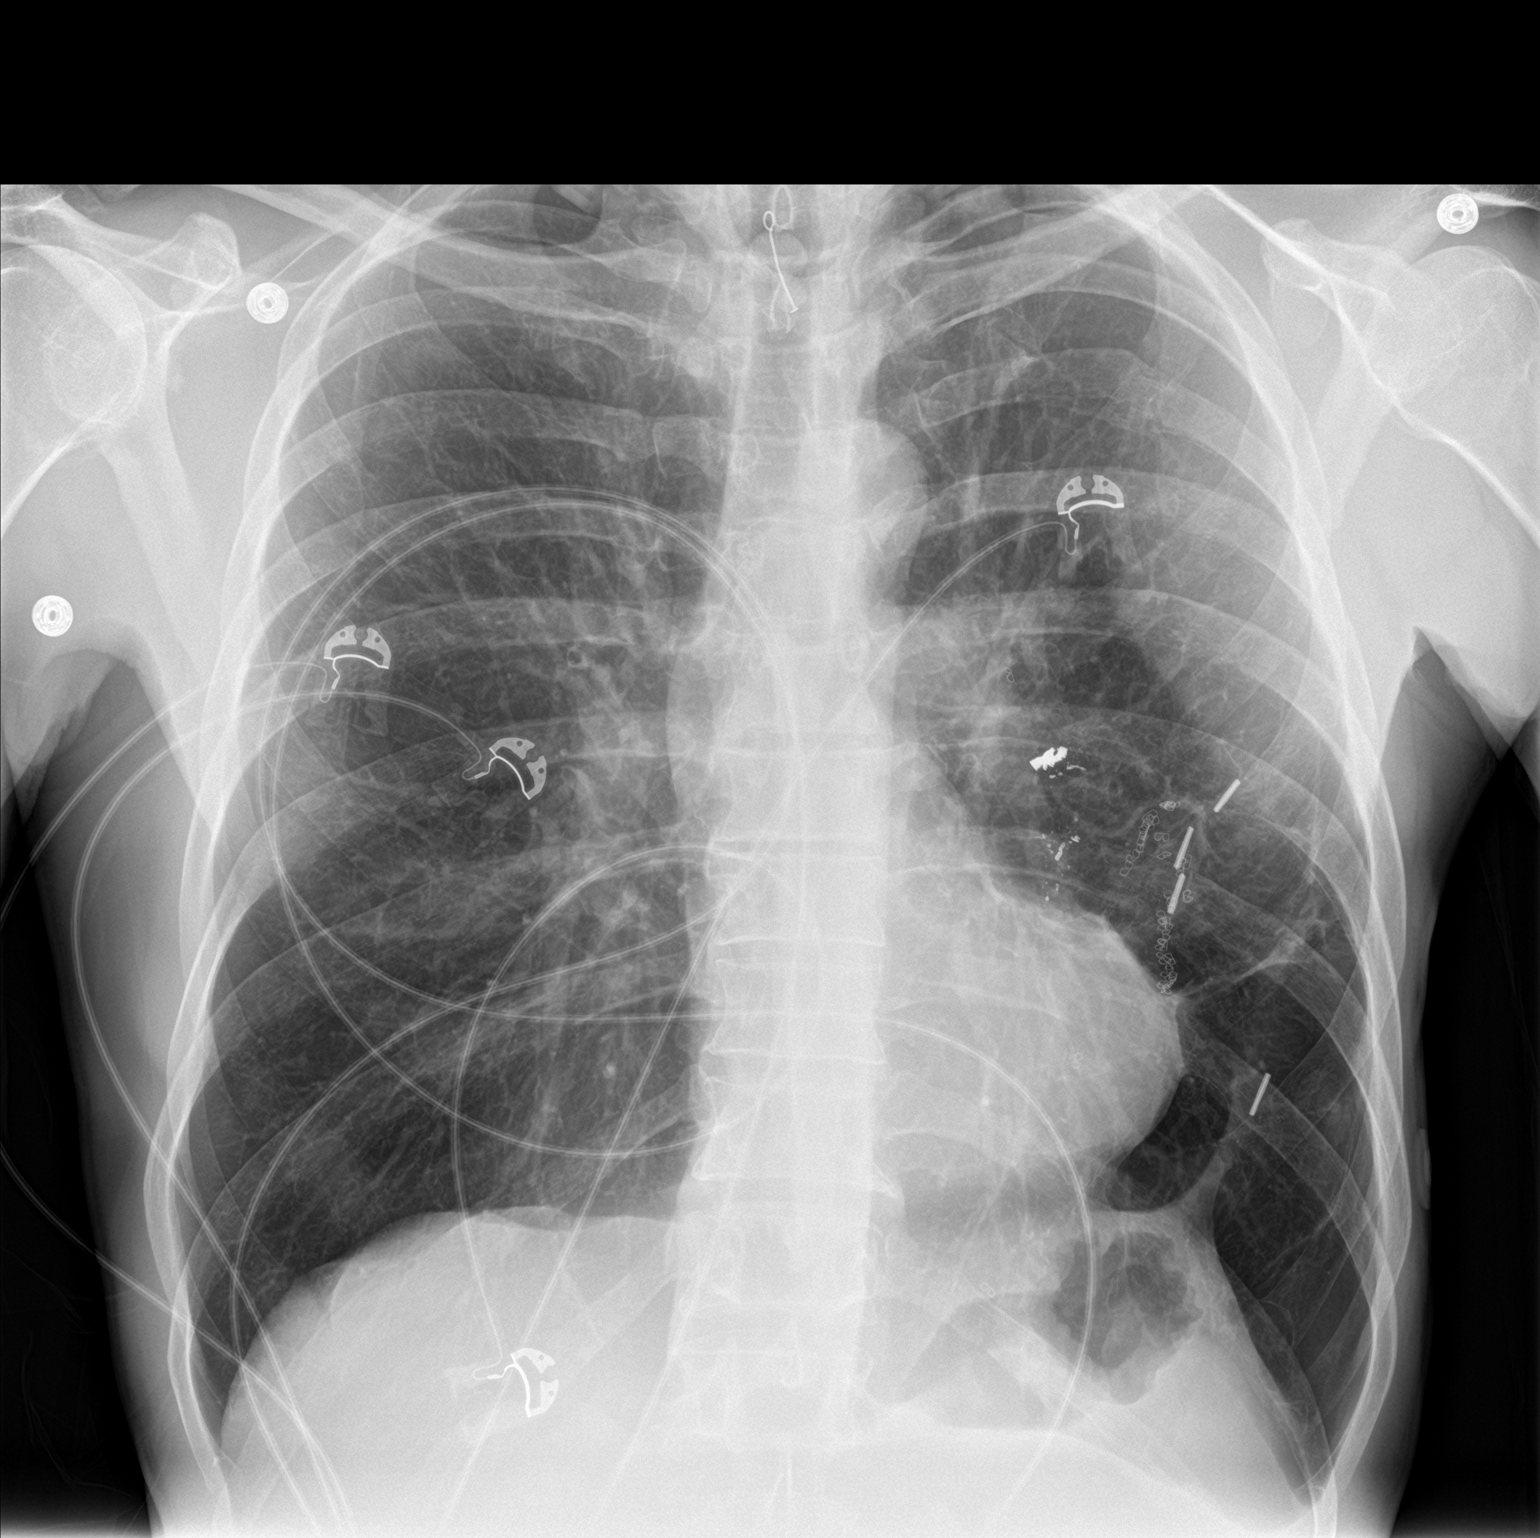

[chest lat]
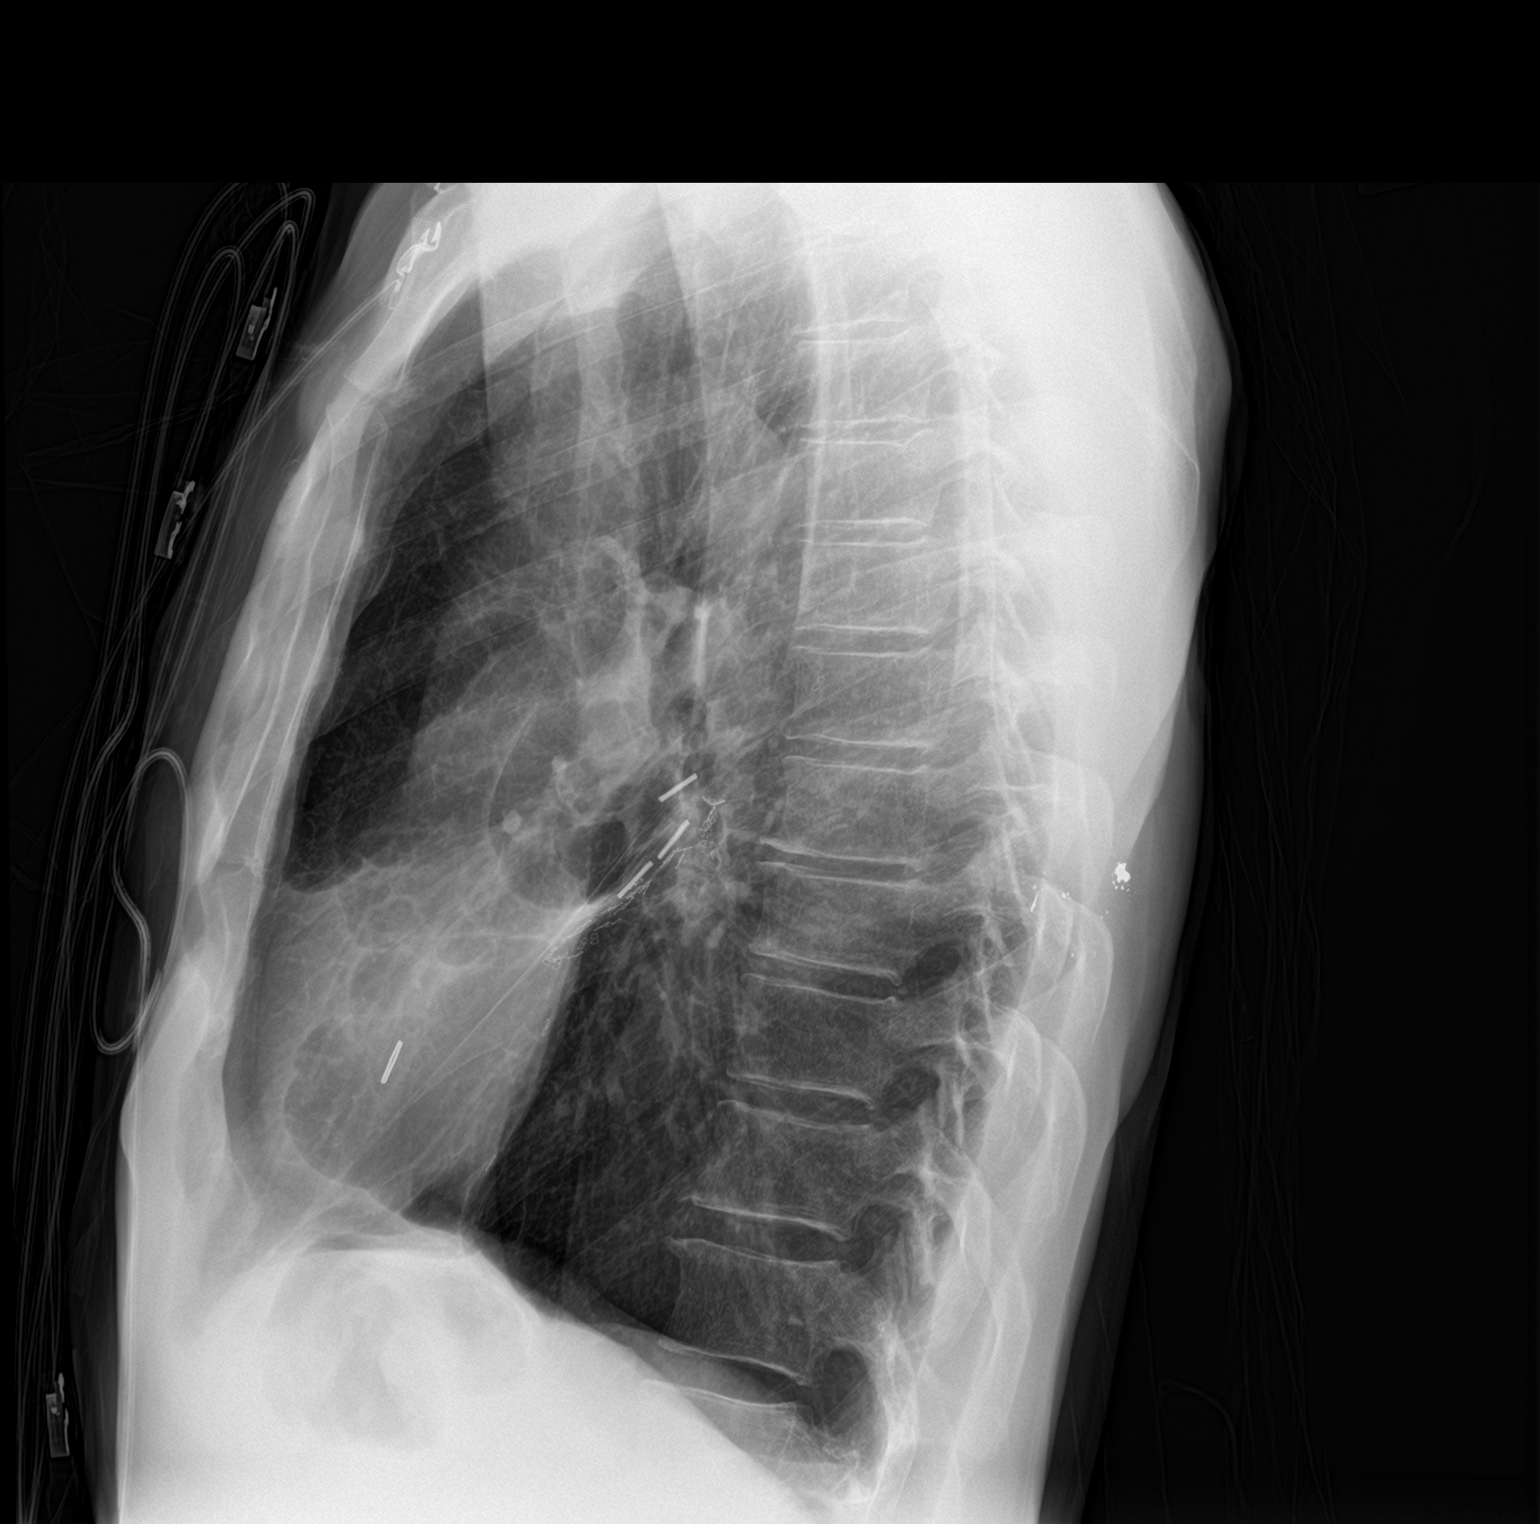

[2 of 2 positions shown; findings below may reference images not displayed]

FINDINGS: The heart size and mediastinal contours are within normal limits.
Stable hyperinflation and appearance of emphysematous lung disease.
Stable postoperative appearance after prior partial lung resection
on the left. There is no evidence of pulmonary edema, consolidation,
pneumothorax, nodule or pleural fluid.

Stable retained shrapnel in the posterior left thoracic soft
tissues. Stable metallic density in the midline upper chest which
localizes to the superficial suprasternal region on the lateral film
and appears to represent some type of piercing. The visualized
skeletal structures are unremarkable.
IMPRESSION: Stable emphysematous lung disease with hyperinflation.

## 2018-03-15 MED ORDER — MOMETASONE FURO-FORMOTEROL FUM 200-5 MCG/ACT IN AERO: 2.0000 | INHALATION_SPRAY | Freq: Two times a day (BID) | RESPIRATORY_TRACT | 4 refills | Status: DC

## 2018-03-15 MED ORDER — PREDNISONE 20 MG PO TABS
60.0000 mg | ORAL_TABLET | Freq: Once | ORAL | Status: AC
  Administered 2018-03-15: 60 mg via ORAL
  Filled 2018-03-15: qty 3

## 2018-03-15 MED ORDER — IPRATROPIUM-ALBUTEROL 0.5-2.5 (3) MG/3ML IN SOLN: 3.0000 mL | Freq: Once | RESPIRATORY_TRACT | Status: DC

## 2018-03-15 MED ORDER — IPRATROPIUM-ALBUTEROL 0.5-2.5 (3) MG/3ML IN SOLN
3.0000 mL | Freq: Once | RESPIRATORY_TRACT | Status: AC
  Administered 2018-03-15: 3 mL via RESPIRATORY_TRACT
  Filled 2018-03-15: qty 3

## 2018-03-15 MED ORDER — PREDNISONE 10 MG PO TABS: 40.0000 mg | ORAL_TABLET | Freq: Every day | ORAL | 0 refills | Status: AC

## 2018-03-15 MED ORDER — ALBUTEROL SULFATE (2.5 MG/3ML) 0.083% IN NEBU: 5.0000 mg | INHALATION_SOLUTION | Freq: Once | RESPIRATORY_TRACT | Status: DC
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
## 2018-03-15 NOTE — ED Triage Notes (Signed)
None of my breathing treatments are working

## 2018-03-15 NOTE — ED Triage Notes (Signed)
Pt. Stated, I have COPD, emphysema, and SOB really bad for the last 2 days. Need to get something done.

## 2018-03-15 NOTE — Discharge Instructions (Signed)
Nice to meet you [name]. We have provided a prescription for a few days of prednisone that you can take starting tomorrow,  as well as a refill of Dulera to hopefully prevent your breathing from getting worse again. Your lungs sounded a lot better after the breathing treatment and your oxygen levels have done great, and your chest xray did not have any sign of infection.

## 2018-05-08 ENCOUNTER — Ambulatory Visit: Payer: Self-pay | Admitting: Family Medicine

## 2018-07-30 ENCOUNTER — Emergency Department (HOSPITAL_COMMUNITY): Payer: Self-pay

## 2018-07-30 ENCOUNTER — Other Ambulatory Visit: Payer: Self-pay

## 2018-07-30 ENCOUNTER — Encounter (HOSPITAL_COMMUNITY): Payer: Self-pay

## 2018-07-30 ENCOUNTER — Observation Stay (HOSPITAL_COMMUNITY)
Admission: EM | Admit: 2018-07-30 | Discharge: 2018-07-31 | Disposition: A | Discharge status: Home / Self Care | Payer: Self-pay | Attending: Internal Medicine | Admitting: Internal Medicine

## 2018-07-30 DIAGNOSIS — F1910 Other psychoactive substance abuse, uncomplicated: Secondary | ICD-10-CM | POA: Diagnosis present

## 2018-07-30 DIAGNOSIS — Z87891 Personal history of nicotine dependence: Secondary | ICD-10-CM | POA: Insufficient documentation

## 2018-07-30 DIAGNOSIS — Z79899 Other long term (current) drug therapy: Secondary | ICD-10-CM | POA: Insufficient documentation

## 2018-07-30 DIAGNOSIS — J441 Chronic obstructive pulmonary disease with (acute) exacerbation: Principal | ICD-10-CM | POA: Insufficient documentation

## 2018-07-30 DIAGNOSIS — F1410 Cocaine abuse, uncomplicated: Secondary | ICD-10-CM | POA: Insufficient documentation

## 2018-07-30 DIAGNOSIS — I4891 Unspecified atrial fibrillation: Secondary | ICD-10-CM | POA: Insufficient documentation

## 2018-07-30 DIAGNOSIS — F909 Attention-deficit hyperactivity disorder, unspecified type: Secondary | ICD-10-CM | POA: Insufficient documentation

## 2018-07-30 DIAGNOSIS — F319 Bipolar disorder, unspecified: Secondary | ICD-10-CM | POA: Insufficient documentation

## 2018-07-30 DIAGNOSIS — J9620 Acute and chronic respiratory failure, unspecified whether with hypoxia or hypercapnia: Secondary | ICD-10-CM | POA: Insufficient documentation

## 2018-07-30 LAB — BLOOD GAS, ARTERIAL
Acid-base deficit: 0.4 mmol/L (ref 0.0–2.0)
Bicarbonate: 22.8 mmol/L (ref 20.0–28.0)
Drawn by: 33099
FIO2: 21
O2 Saturation: 95.5 %
PATIENT TEMPERATURE: 98.6
pCO2 arterial: 31 mmHg — ABNORMAL LOW (ref 32.0–48.0)
pH, Arterial: 7.479 — ABNORMAL HIGH (ref 7.350–7.450)
pO2, Arterial: 73.3 mmHg — ABNORMAL LOW (ref 83.0–108.0)

## 2018-07-30 LAB — RAPID URINE DRUG SCREEN, HOSP PERFORMED
AMPHETAMINES: POSITIVE — AB
Barbiturates: NOT DETECTED
Benzodiazepines: NOT DETECTED
Cocaine: POSITIVE — AB
Opiates: POSITIVE — AB
Tetrahydrocannabinol: POSITIVE — AB

## 2018-07-30 LAB — CBC
HCT: 43.5 % (ref 39.0–52.0)
Hemoglobin: 14.6 g/dL (ref 13.0–17.0)
MCH: 30.7 pg (ref 26.0–34.0)
MCHC: 33.6 g/dL (ref 30.0–36.0)
MCV: 91.6 fL (ref 78.0–100.0)
Platelets: 245 10*3/uL (ref 150–400)
RBC: 4.75 MIL/uL (ref 4.22–5.81)
RDW: 12 % (ref 11.5–15.5)
WBC: 6.5 10*3/uL (ref 4.0–10.5)

## 2018-07-30 LAB — I-STAT TROPONIN, ED: TROPONIN I, POC: 0 ng/mL (ref 0.00–0.08)

## 2018-07-30 LAB — BASIC METABOLIC PANEL
Anion gap: 11 (ref 5–15)
BUN: 8 mg/dL (ref 6–20)
CALCIUM: 9.7 mg/dL (ref 8.9–10.3)
CO2: 26 mmol/L (ref 22–32)
CREATININE: 1.2 mg/dL (ref 0.61–1.24)
Chloride: 104 mmol/L (ref 98–111)
GFR calc Af Amer: >60 mL/min (ref >60)
GLUCOSE: 104 mg/dL — AB (ref 70–99)
Potassium: 3.3 mmol/L — ABNORMAL LOW (ref 3.5–5.1)
Sodium: 141 mmol/L (ref 135–145)

## 2018-07-30 IMAGING — DX DG CHEST 2V
2 series · 2 of 2 positions shown · non-contrast
Comparison: [DATE]

CLINICAL DATA: Shortness of breath and cough.  History of COPD.

EXAM:
CHEST - 2 VIEW

[chest pa]
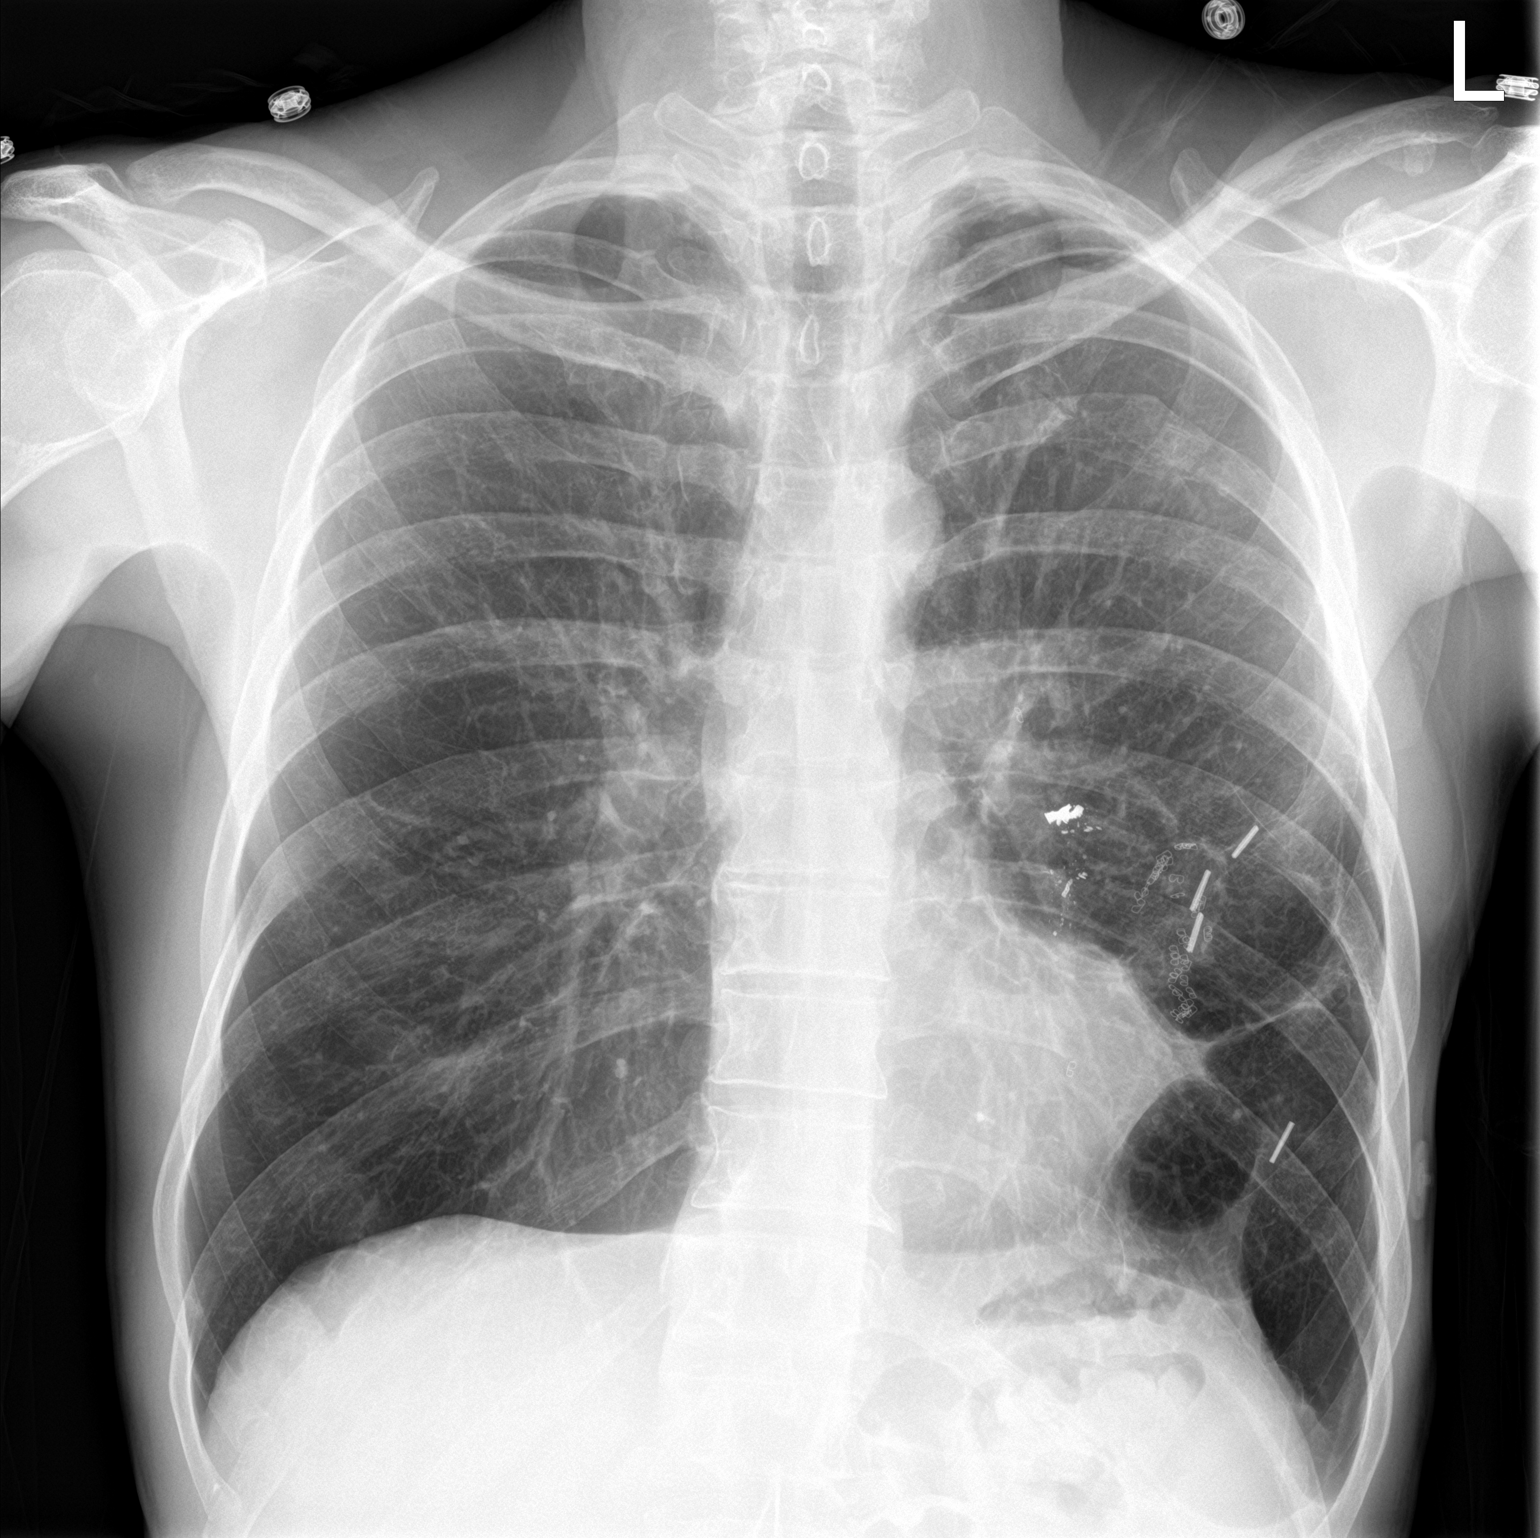

[chest lat]
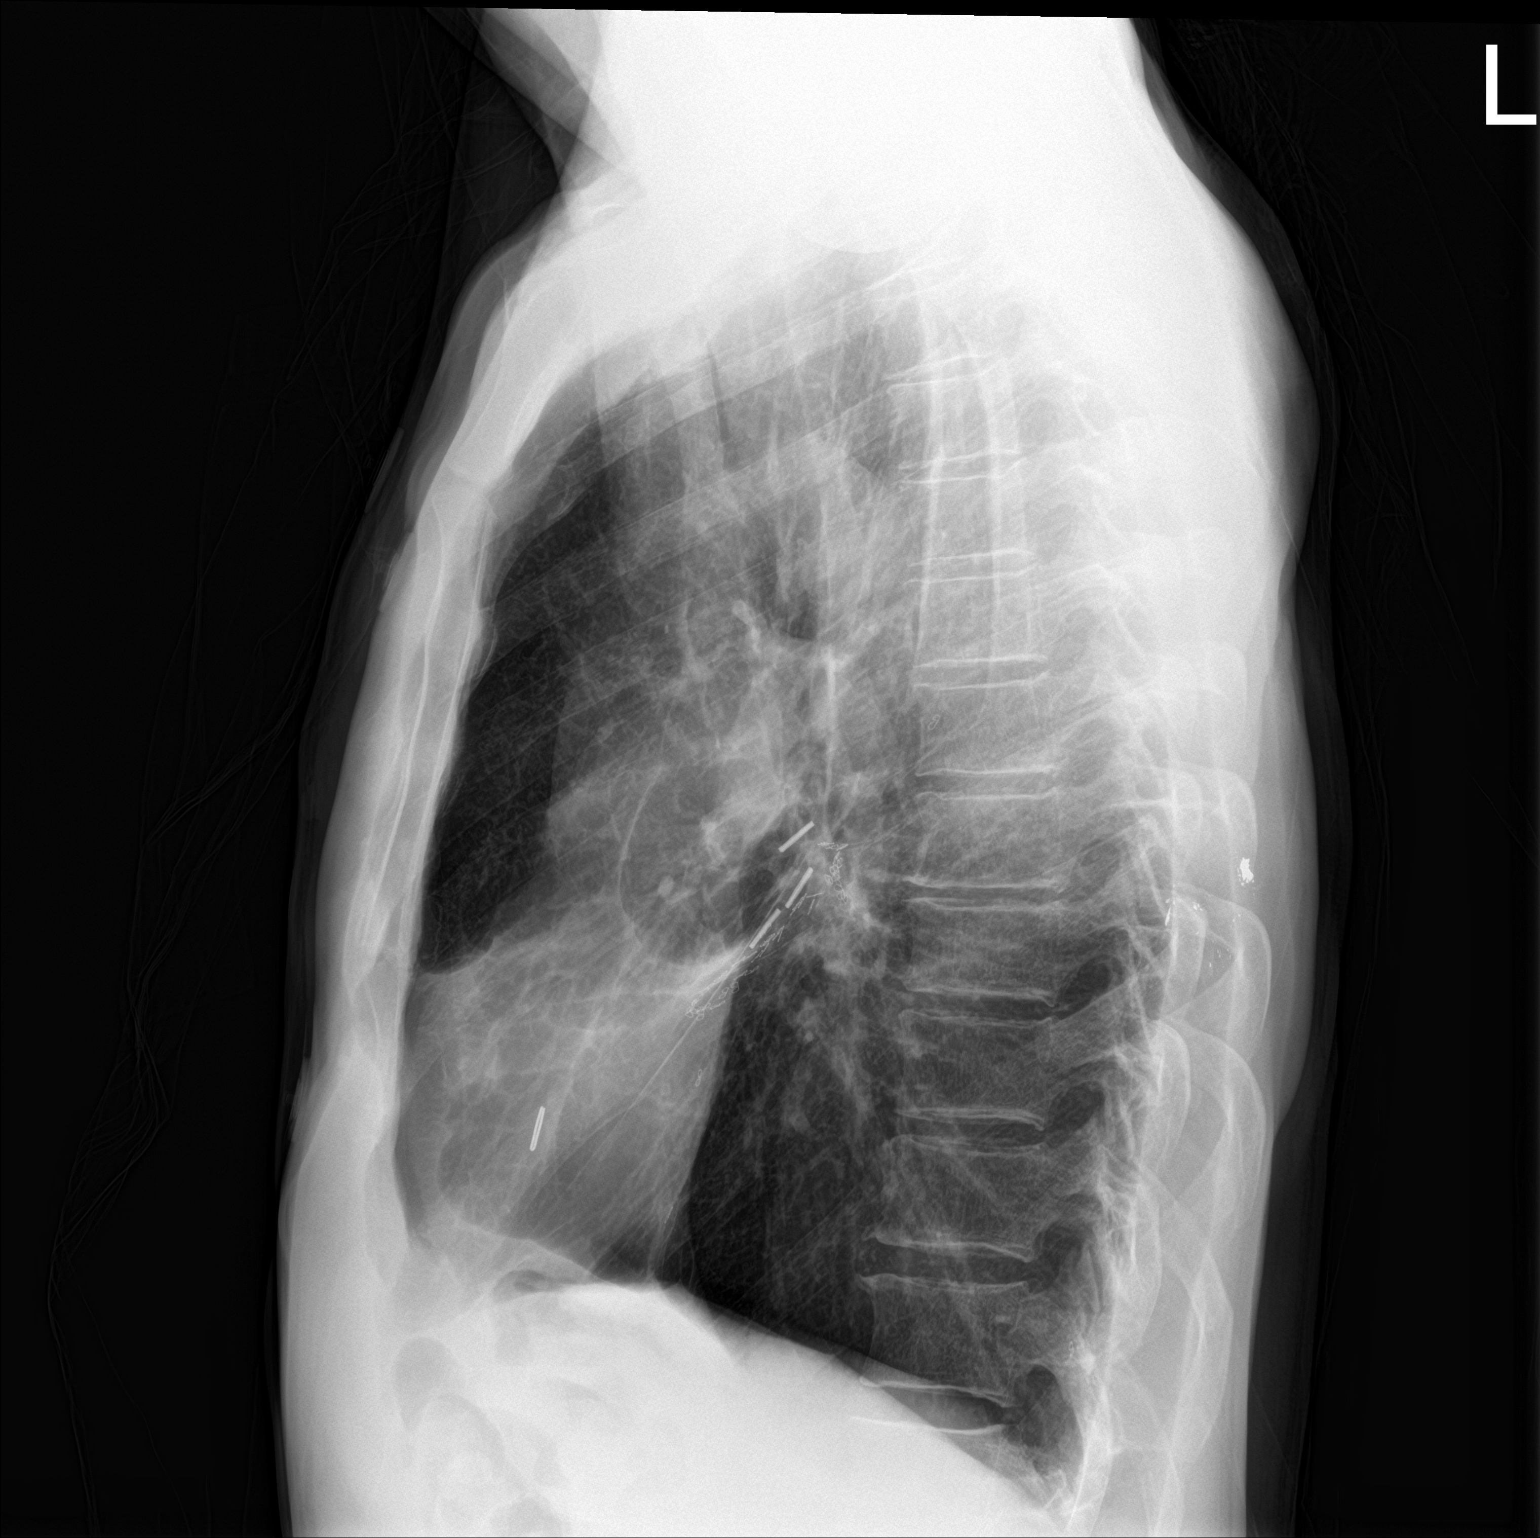

[2 of 2 positions shown; findings below may reference images not displayed]

FINDINGS: The heart size and mediastinal contours are within normal limits.
Stable emphysematous disease and evidence of prior left lung surgery
retained shrapnel in the left posterior chest wall. There is no
evidence of pulmonary edema, consolidation, pneumothorax, nodule or
pleural fluid. The visualized skeletal structures are unremarkable.
IMPRESSION: No active cardiopulmonary disease.

## 2018-07-30 MED ORDER — ACETAMINOPHEN 325 MG PO TABS
650.0000 mg | ORAL_TABLET | Freq: Four times a day (QID) | ORAL | Status: DC | PRN
  Administered 2018-07-30 – 2018-07-31 (×2): 650 mg via ORAL
  Filled 2018-07-30 (×2): qty 2

## 2018-07-30 MED ORDER — ALBUTEROL (5 MG/ML) CONTINUOUS INHALATION SOLN
10.0000 mg/h | INHALATION_SOLUTION | RESPIRATORY_TRACT | Status: DC
  Administered 2018-07-30: 10 mg/h via RESPIRATORY_TRACT
  Filled 2018-07-30: qty 20

## 2018-07-30 MED ORDER — ENOXAPARIN SODIUM 40 MG/0.4ML ~~LOC~~ SOLN: 40.0000 mg | SUBCUTANEOUS | Status: DC

## 2018-07-30 MED ORDER — METHYLPREDNISOLONE SODIUM SUCC 125 MG IJ SOLR
60.0000 mg | Freq: Two times a day (BID) | INTRAMUSCULAR | Status: AC
  Administered 2018-07-30 – 2018-07-31 (×2): 60 mg via INTRAVENOUS
  Filled 2018-07-30 (×2): qty 2

## 2018-07-30 MED ORDER — IPRATROPIUM BROMIDE 0.02 % IN SOLN
0.5000 mg | Freq: Once | RESPIRATORY_TRACT | Status: AC
  Administered 2018-07-30: 0.5 mg via RESPIRATORY_TRACT
  Filled 2018-07-30: qty 2.5

## 2018-07-30 MED ORDER — IPRATROPIUM-ALBUTEROL 0.5-2.5 (3) MG/3ML IN SOLN
3.0000 mL | Freq: Once | RESPIRATORY_TRACT | Status: AC
  Administered 2018-07-30: 3 mL via RESPIRATORY_TRACT

## 2018-07-30 MED ORDER — AZITHROMYCIN 250 MG PO TABS
500.0000 mg | ORAL_TABLET | Freq: Once | ORAL | Status: AC
  Administered 2018-07-30: 500 mg via ORAL
  Filled 2018-07-30: qty 2

## 2018-07-30 MED ORDER — FAMOTIDINE 20 MG PO TABS
40.0000 mg | ORAL_TABLET | Freq: Every day | ORAL | Status: DC
  Administered 2018-07-30: 40 mg via ORAL
  Filled 2018-07-30: qty 2

## 2018-07-30 MED ORDER — IPRATROPIUM-ALBUTEROL 0.5-2.5 (3) MG/3ML IN SOLN
3.0000 mL | Freq: Four times a day (QID) | RESPIRATORY_TRACT | Status: DC
  Administered 2018-07-30 – 2018-07-31 (×3): 3 mL via RESPIRATORY_TRACT
  Filled 2018-07-30 (×3): qty 3

## 2018-07-30 MED ORDER — DOCUSATE SODIUM 100 MG PO CAPS
100.0000 mg | ORAL_CAPSULE | Freq: Two times a day (BID) | ORAL | Status: DC
  Administered 2018-07-31: 100 mg via ORAL
  Filled 2018-07-30 (×2): qty 1

## 2018-07-30 MED ORDER — PREDNISONE 20 MG PO TABS: 40.0000 mg | ORAL_TABLET | Freq: Every day | ORAL | Status: DC

## 2018-07-30 MED ORDER — POTASSIUM CHLORIDE CRYS ER 20 MEQ PO TBCR
20.0000 meq | EXTENDED_RELEASE_TABLET | Freq: Once | ORAL | Status: AC
  Administered 2018-07-30: 20 meq via ORAL
  Filled 2018-07-30: qty 1

## 2018-07-30 MED ORDER — ALBUTEROL SULFATE (2.5 MG/3ML) 0.083% IN NEBU
5.0000 mg | INHALATION_SOLUTION | Freq: Once | RESPIRATORY_TRACT | Status: AC
  Administered 2018-07-30: 5 mg via RESPIRATORY_TRACT
  Filled 2018-07-30: qty 6

## 2018-07-30 MED ORDER — ONDANSETRON HCL 4 MG/2ML IJ SOLN: 4.0000 mg | Freq: Four times a day (QID) | INTRAMUSCULAR | Status: DC | PRN

## 2018-07-30 MED ORDER — IPRATROPIUM-ALBUTEROL 0.5-2.5 (3) MG/3ML IN SOLN
RESPIRATORY_TRACT | Status: AC
  Administered 2018-07-30: 13:00:00 via RESPIRATORY_TRACT
  Filled 2018-07-30: qty 3

## 2018-07-30 MED ORDER — ALBUTEROL SULFATE (2.5 MG/3ML) 0.083% IN NEBU
2.5000 mg | INHALATION_SOLUTION | RESPIRATORY_TRACT | Status: DC | PRN
  Filled 2018-07-30: qty 3

## 2018-07-30 MED ORDER — DOXYCYCLINE HYCLATE 100 MG PO TABS
100.0000 mg | ORAL_TABLET | Freq: Two times a day (BID) | ORAL | Status: DC
  Administered 2018-07-30 – 2018-07-31 (×2): 100 mg via ORAL
  Filled 2018-07-30 (×2): qty 1

## 2018-07-30 MED ORDER — MOMETASONE FURO-FORMOTEROL FUM 200-5 MCG/ACT IN AERO
2.0000 | INHALATION_SPRAY | Freq: Two times a day (BID) | RESPIRATORY_TRACT | Status: DC
  Administered 2018-07-31: 2 via RESPIRATORY_TRACT
  Filled 2018-07-30: qty 8.8

## 2018-07-30 MED ORDER — IPRATROPIUM-ALBUTEROL 0.5-2.5 (3) MG/3ML IN SOLN
3.0000 mL | Freq: Once | RESPIRATORY_TRACT | Status: DC
  Filled 2018-07-30: qty 3

## 2018-07-30 MED ORDER — ONDANSETRON HCL 4 MG PO TABS: 4.0000 mg | ORAL_TABLET | Freq: Four times a day (QID) | ORAL | Status: DC | PRN

## 2018-07-30 MED ORDER — ACETAMINOPHEN 650 MG RE SUPP: 650.0000 mg | Freq: Four times a day (QID) | RECTAL | Status: DC | PRN

## 2018-07-30 MED ORDER — PREDNISONE 20 MG PO TABS
60.0000 mg | ORAL_TABLET | Freq: Once | ORAL | Status: AC
  Administered 2018-07-30: 60 mg via ORAL
  Filled 2018-07-30: qty 3

## 2018-07-30 MED ORDER — IPRATROPIUM-ALBUTEROL 0.5-2.5 (3) MG/3ML IN SOLN
3.0000 mL | Freq: Once | RESPIRATORY_TRACT | Status: AC
  Administered 2018-07-30: 3 mL via RESPIRATORY_TRACT
  Filled 2018-07-30: qty 3
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
## 2018-07-30 NOTE — ED Triage Notes (Signed)
Patient complains of increased SOB and cough x 2 days. Hx of COPD and out of inhaler x 1 week. Speaking complete sentences. Reports CP with inspiration. Alert and oriented

## 2018-07-30 NOTE — Progress Notes (Addendum)
Shift event: RN paged NP because pt had a short episode of unresponsiveness. He awakened quickly, had no tongue biting, incontinence, hypoxia, or change in VS. NP to bedside.  S: Pt states he does not remember what happened. He states he has CP "all over" 12/10 and some SOB. No radiation of pain to jaw, but endorses right arm pain. Describes pain as pressure. No n/v or abdominal pain. RN states the episode was only a minute or so and pt immediately woke up and was back to normal neurologically. Per pt, he has not touched illicit drugs, ETOH or tobacco in "many years". Per male in the room, he has been having "seizures" at home and she has tried to get him to go to neurologist. No past hx of seizures per pt, childhood or otherwise. Per male in room, he will "convulse" sometimes. States all four extremities shake and head is to the right. Never has incontinence or tongue biting. She states that he had 10 episodes similar to this the other day. He does not lose consciousness or "pass out" with the episodes and is his normal self throughout, except for the shaking.  O: Pt appears somewhat anxious but in NAD. VS, afebrile, BP 130s, HR 1 teens, RR normal. He is awake and alert when examined. Oriented x 3. Card: RRR, no LE edema. Lungs: CTA with diminished breath sounds at the bases. No wheezing. Abd: flat with normal BS, soft and non tender. Neuro: oriented. PERRL. Speech is clear and fluent, he has shaking activity of extremities which rotates from one extremity to the other. No shaking of head. He gives little effort to participate in neuro exam, but MOE x 4 spontaneously and he follows commands. He will not grip, but is able to dorsiflex with moderate strength.  A/P:  1. Unresponsive episode-NP questions the validity of situation. He immediately became his normal self again. No drop in O2 sat, no increased effort of breathing. He gives a suspicious hx of the situation.  2. "convulsions" at home-he had no tongue  biting, LOC, or incontinence with this episode nor does he have at home. Pt has a hx of Bipolar and this activity sounds more like pseudoseizures. NP doubts this was seizure activity tonight. Will defer to day doc an EEG or neuro consult, unless suspicious seizure activity is witnessed tonight.  3. COPD exacerbation-stable at present. No respiratory distress. No hypoxia. Given unresponsive episode, checked ABG which is basically normal.  4. Tremors-pt denies drug or ETOH abuse.  5. Chest pain-EKG normal. Will check troponin x 1. Pain is described as atypical.  6. Code status-told admitting MD he wanted to be a DNR. Now, he changes his mind and wishes to be a FULL CODE. Order changed. KJKG, NP [facility]  Total critical care time: 45 minutes Critical care time was exclusive of separately billable procedures and treating other patients. Critical care was necessary to treat or prevent imminent or life-threatening deterioration. Critical care was time spent personally by me on the following activities: development of treatment plan with patient and/or surrogate as well as nursing, discussions with consultants, evaluation of patient's response to treatment, examination of patient, obtaining history from patient or surrogate, ordering and performing treatments and interventions, ordering and review of laboratory studies, ordering and review of radiographic studies, pulse oximetry and re-evaluation of patient's condition.

## 2018-07-30 NOTE — Progress Notes (Signed)
Nurse walked in to patient room at 1915 for bedside report, pt alert and oriented x4. Denies pain, requesting breathing treatment. Pulse ox. 98% on RA. Lower Breath sounds wheezing. Pt able to talk in full complete sentences without pause or breaks. No acute distress noted. At [place], nurse went into room while spouse was visiting and patient had visible tremors and stated had headache but no nausea,  vommiting, tactile or visual disturbances.   At [place] pt was not responding to nurse, nurse perform sternal rub and continued to receive no response. Chest rise visible and symmetrical. RR 15. Paged provider at [place]. Pt began responding after 45 seconds later. Alert and oriented only to self. Complete set of vitals taken within event (see flow sheets). Vitals WDL. Pt placed on cardiac monitor, ST at 112 bpm. Continuous pulse ox also placed on patient. At 2002, pt became more responsive, alert and oriented x4. Continued to have slight tremors. Provider second paged at 2005. Received cal back at 2006. PA kirby updated with patient status. Provider came to bedside at 2013 to assess patient. Pt now alert and oriented x4. No acute distress noted.
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
## 2018-07-30 NOTE — ED Notes (Signed)
Patient transported to X-ray 

## 2018-07-30 NOTE — ED Notes (Signed)
Pt's spo2 dropped to 90% (from 95%) on RA while ambulating down the hallway and HR increased to 134 bpm. His breathing became more labored and his RR increased. Pt reported he felt worse.

## 2018-07-31 LAB — BASIC METABOLIC PANEL
ANION GAP: 13 (ref 5–15)
BUN: 10 mg/dL (ref 6–20)
CALCIUM: 9.1 mg/dL (ref 8.9–10.3)
CO2: 19 mmol/L — ABNORMAL LOW (ref 22–32)
CREATININE: 1.06 mg/dL (ref 0.61–1.24)
Chloride: 107 mmol/L (ref 98–111)
GFR calc Af Amer: >60 mL/min (ref >60)
GLUCOSE: 134 mg/dL — AB (ref 70–99)
Potassium: 4.4 mmol/L (ref 3.5–5.1)
Sodium: 139 mmol/L (ref 135–145)

## 2018-07-31 LAB — CBC
HCT: 42.1 % (ref 39.0–52.0)
HEMOGLOBIN: 14 g/dL (ref 13.0–17.0)
MCH: 30.2 pg (ref 26.0–34.0)
MCHC: 33.3 g/dL (ref 30.0–36.0)
MCV: 90.9 fL (ref 78.0–100.0)
PLATELETS: 247 10*3/uL (ref 150–400)
RBC: 4.63 MIL/uL (ref 4.22–5.81)
RDW: 12.1 % (ref 11.5–15.5)
WBC: 9.3 10*3/uL (ref 4.0–10.5)

## 2018-07-31 LAB — HIV ANTIBODY (ROUTINE TESTING W REFLEX): HIV Screen 4th Generation wRfx: NONREACTIVE

## 2018-07-31 LAB — TROPONIN I

## 2018-07-31 MED ORDER — DOXYCYCLINE HYCLATE 100 MG PO TABS: 100.0000 mg | ORAL_TABLET | Freq: Two times a day (BID) | ORAL | 0 refills | Status: AC

## 2018-07-31 MED ORDER — PREDNISONE 10 MG PO TABS: ORAL_TABLET | ORAL | 0 refills | Status: DC

## 2018-07-31 MED FILL — $PROVENTIL HFA 90 MCG INHAL: 108 (90 BAS | 16 days supply | Qty: 1 | Fill #0

## 2018-07-31 MED FILL — DOXYCYCLINE HYCLATE 100 MG: 100 | 7 days supply | Qty: 14 | Fill #0

## 2018-07-31 MED FILL — predniSONE 10 MG TABS: 10 | 15 days supply | Qty: 48 | Fill #0

## 2018-07-31 NOTE — Plan of Care (Signed)
Pt alert and oriented x4. Skin warm and dry. Respirations equal and unlabored. Pt communicated respiratory symptoms improved with treatments. Pt verbalized understanding risk factors. Pt progressing towards discharge.

## 2018-07-31 NOTE — Progress Notes (Signed)
Pt states his significant other will be his source of transportation today upon d/c.

## 2018-07-31 NOTE — Progress Notes (Signed)
Pt has all belongings at bedside and is prepared to discharge.

## 2018-07-31 NOTE — Progress Notes (Signed)
Pt stable, ambulatory, and verbalizes understanding of d/c instructions.   Pt has spoken with girlfriend who will provide transportation for pt.

## 2018-07-31 NOTE — Discharge Summary (Signed)
Physician Discharge Summary  Patient ID: Kenneth Mcdowell MRN: [id number] DOB/AGE: [birth year] 52 y.o.  Admit date: 07/30/2018 Discharge date: 07/31/2018  Admission Diagnoses:  Discharge Diagnoses:  Principal Problem:   COPD with acute exacerbation ([place]) Active Problems:   Polysubstance abuse [facility])   Discharged Condition: stable  Hospital Course: Patient is a 52 year old male with past medical history significant for COPD, bipolar disorder, ADHD, atrial fibrillation but not on anticoagulation.  Patient was admitted with COPD exacerbation.  Patient was admitted and treated with oral steroids, nebulizer treatment and inhalers.  Patient was also treated with antibiotics, doxycycline.  Patient is back to his baseline.  Patient is currently on room air.  Lung examination prior to discharge was negative for wheezing.  Good air entry was noted.  Patient will be discharged back home to the care of the primary care provider.  Consults: None  Discharge Exam: Blood pressure 121/65, pulse 97, temperature 98.6 F (37 C), temperature source Oral, resp. rate 17, height 6\' 4"  (1.93 m), weight 72.6 kg, SpO2 98 %.   Disposition: Discharge disposition: 01-Home or Self Care   Discharge Instructions    Diet - low sodium heart healthy   Complete by:  As directed    Increase activity slowly   Complete by:  As directed      Allergies as of 07/31/2018   No Known Allergies     Medication List    TAKE these medications   acetaminophen 325 MG tablet Commonly known as:  TYLENOL Take 2 tablets (650 mg total) by mouth every 6 (six) hours as needed for mild pain (or Fever >/= 101).   albuterol 108 (90 Base) MCG/ACT inhaler Commonly known as:  PROVENTIL HFA;VENTOLIN HFA Inhale 2 puffs into the lungs every 4 (four) hours as needed for wheezing or shortness of breath. What changed:  Another medication with the same name was changed. Make sure you understand how and when to take each.   albuterol (2.5  MG/3ML) 0.083% nebulizer solution Commonly known as:  PROVENTIL USE 1 VIAL IN NEBULIZER EVERY 6 HOURS AS NEEDED FOR WHEEZING OR SHORTNESS OF BREATH What changed:  See the new instructions.   doxycycline 100 MG tablet Commonly known as:  VIBRA-TABS Take 1 tablet (100 mg total) by mouth every 12 (twelve) hours for 7 days.   famotidine 20 MG tablet Commonly known as:  PEPCID Take 2 tablets (40 mg total) by mouth at bedtime.   mometasone-formoterol 200-5 MCG/ACT Aero Commonly known as:  DULERA Inhale 2 puffs into the lungs 2 (two) times daily.   predniSONE 10 MG tablet Commonly known as:  DELTASONE Prednisone 60 milligrams p.o. daily for 3 days, then 40 mg p.o. daily for 3 days, then 30 mg p.o. daily for 3 days, then 20 mg p.o. daily for 3 days, then 10 mg p.o. daily for 3 days and stop.        SignedBonnell Public 07/31/2018, 10:47 AM

## 2018-07-31 NOTE — Progress Notes (Signed)
CSW received consult regarding housing needs. Patient asleep and does not awaken. CSW provided resources on bedside table. RN aware and reports patient's girlfriend will pick patient up at discharge.   CSW signing off. Please contact CSW for additional needs.   Kenneth Locus Christophor Eick LCSW [phone]

## 2018-07-31 NOTE — Progress Notes (Signed)
This RN rounded on pt, pt still stating his headache is 10/10 after tylenol.  Pt requesting something stronger.  Pt alert and oriented x4.  Lung sounds clear, diminished on lower right lobe. Pt seems mildly anxious but calm and cooperative.

## 2018-09-09 ENCOUNTER — Emergency Department (HOSPITAL_COMMUNITY)
Admission: EM | Admit: 2018-09-09 | Discharge: 2018-09-10 | Disposition: A | Discharge status: Home / Self Care | Payer: Self-pay | Attending: Emergency Medicine | Admitting: Emergency Medicine

## 2018-09-09 ENCOUNTER — Other Ambulatory Visit: Payer: Self-pay

## 2018-09-09 ENCOUNTER — Emergency Department (HOSPITAL_COMMUNITY): Payer: Self-pay

## 2018-09-09 ENCOUNTER — Encounter (HOSPITAL_COMMUNITY): Payer: Self-pay

## 2018-09-09 DIAGNOSIS — Z87891 Personal history of nicotine dependence: Secondary | ICD-10-CM | POA: Insufficient documentation

## 2018-09-09 DIAGNOSIS — F1410 Cocaine abuse, uncomplicated: Secondary | ICD-10-CM | POA: Insufficient documentation

## 2018-09-09 DIAGNOSIS — F1210 Cannabis abuse, uncomplicated: Secondary | ICD-10-CM | POA: Insufficient documentation

## 2018-09-09 DIAGNOSIS — J441 Chronic obstructive pulmonary disease with (acute) exacerbation: Secondary | ICD-10-CM | POA: Insufficient documentation

## 2018-09-09 DIAGNOSIS — R05 Cough: Secondary | ICD-10-CM | POA: Insufficient documentation

## 2018-09-09 DIAGNOSIS — Z79899 Other long term (current) drug therapy: Secondary | ICD-10-CM | POA: Insufficient documentation

## 2018-09-09 LAB — I-STAT TROPONIN, ED: Troponin i, poc: 0 ng/mL (ref 0.00–0.08)

## 2018-09-09 IMAGING — DX DG CHEST 1V PORT
1 series · 2 of 2 positions shown · non-contrast
Comparison: None.

CLINICAL DATA: Emphysema and dyspnea.

EXAM:
PORTABLE CHEST 1 VIEW

[Series 1: chest · 0.14mm/px · 2 of 2 slices shown]
[im 1/2]
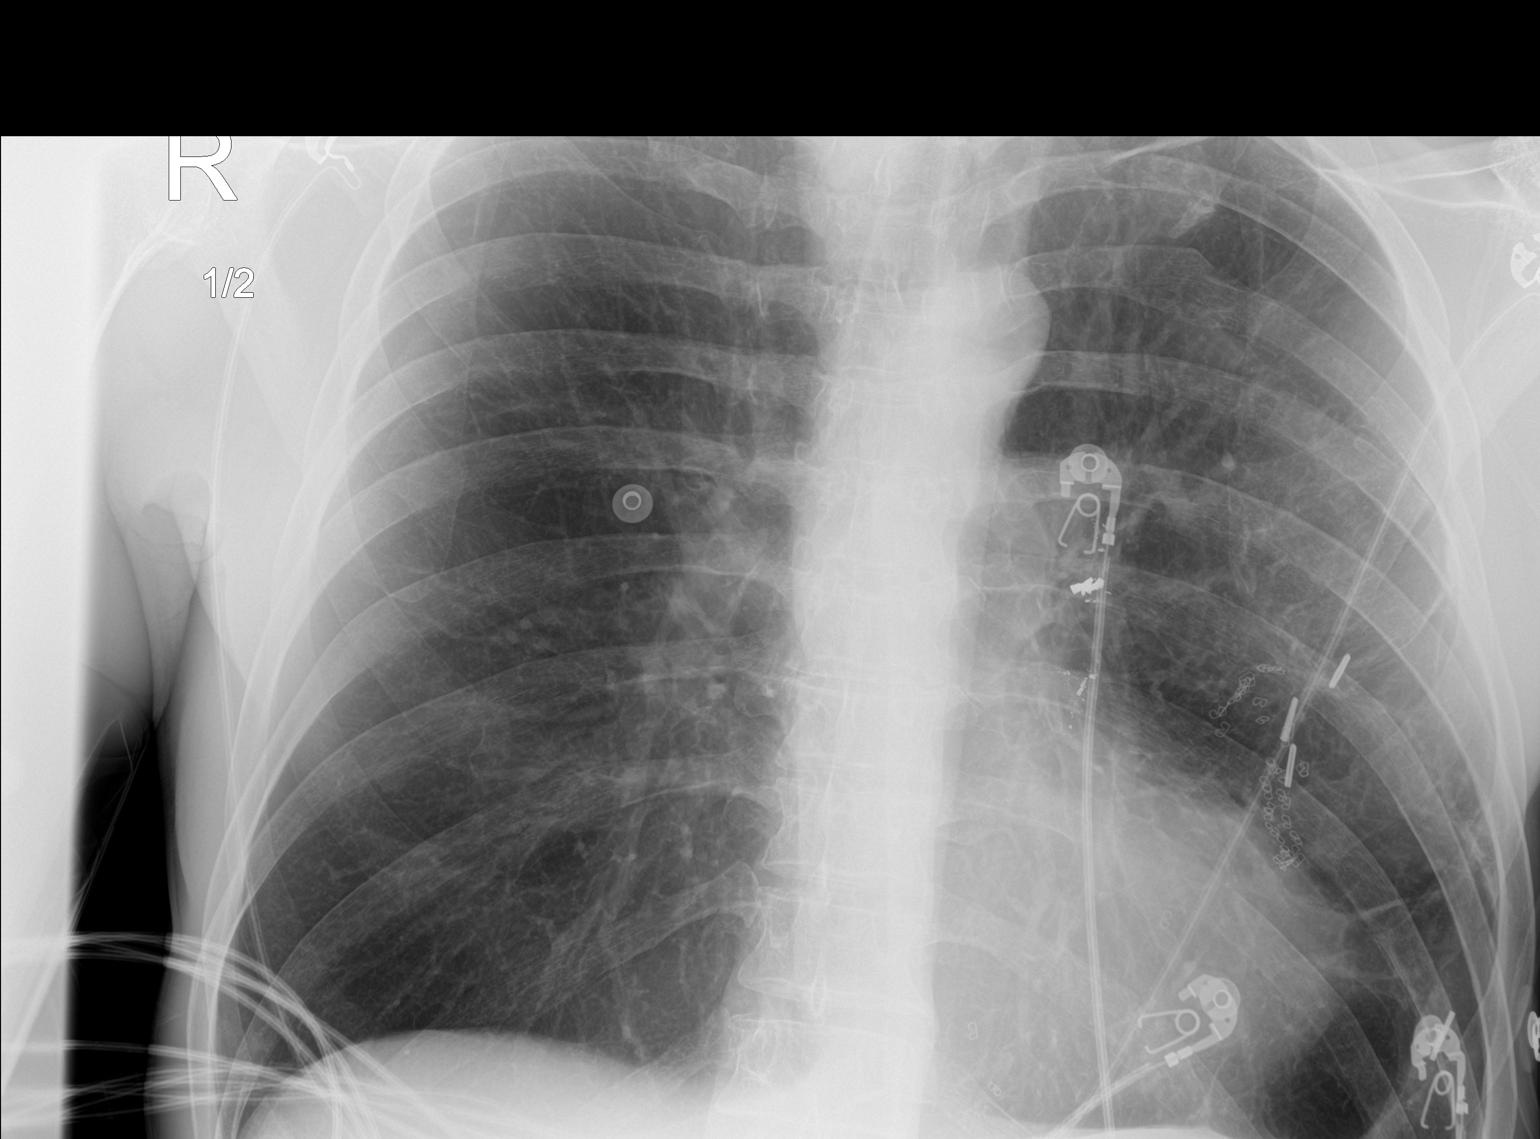
[im 2/2]
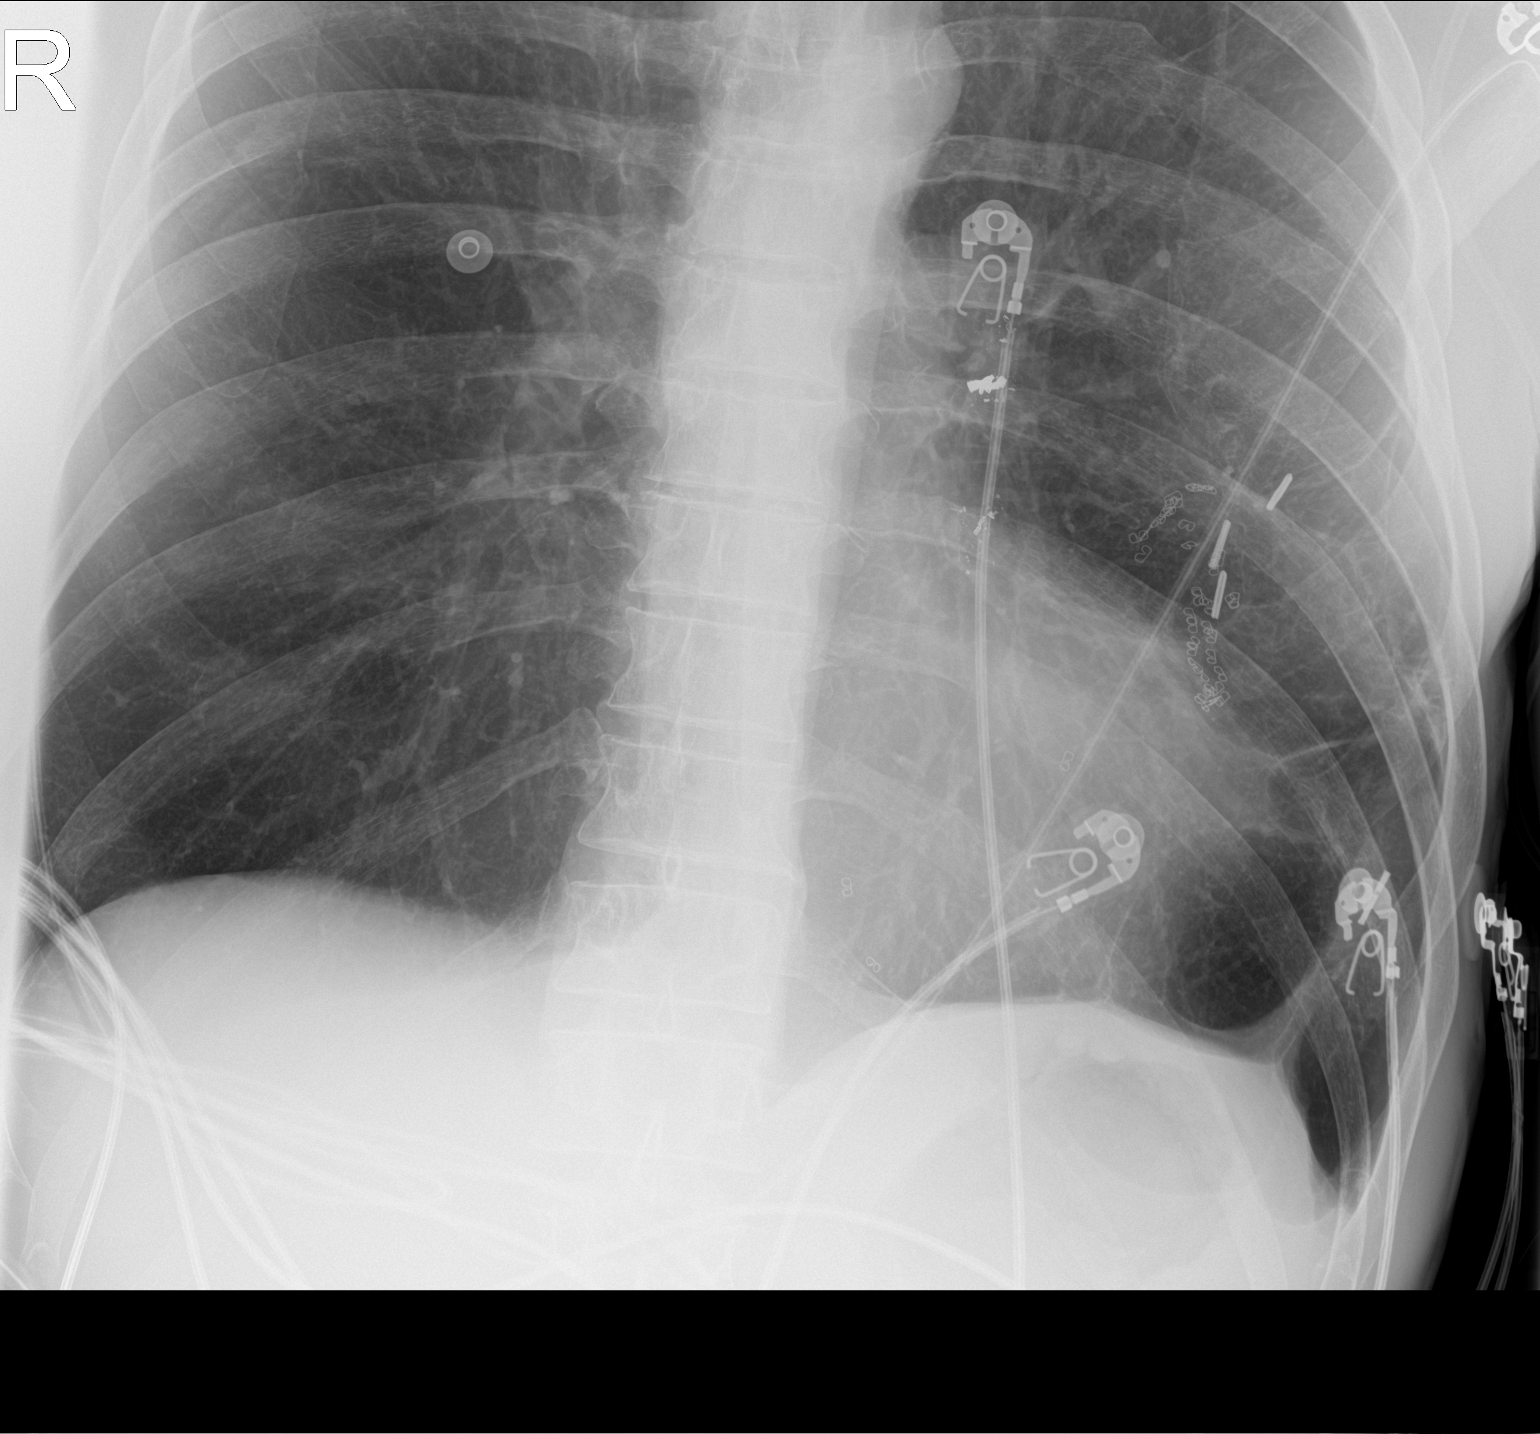

[2 of 2 positions shown; findings below may reference images not displayed]

FINDINGS: The lateral costophrenic angles and tips of both lung apices are
excluded on this portable study. Emphysematous hyperinflation of the
lungs with chain sutures and surgical clips projecting over the left
mid thorax are again demonstrated. A few scattered coarse metallic
foreign bodies also project over the left hemithorax as before. No
pulmonary consolidation or edema. Scarring along the periphery of
the left base is stable. Heart is top-normal in size. Nonaneurysmal
thoracic aorta. No acute osseous abnormality.
IMPRESSION: COPD without significant change.

## 2018-09-09 MED ORDER — ALBUTEROL (5 MG/ML) CONTINUOUS INHALATION SOLN
INHALATION_SOLUTION | RESPIRATORY_TRACT | Status: AC
  Administered 2018-09-09: 23:00:00
  Filled 2018-09-09: qty 20

## 2018-09-09 MED ORDER — ALBUTEROL (5 MG/ML) CONTINUOUS INHALATION SOLN: 10.0000 mg/h | INHALATION_SOLUTION | RESPIRATORY_TRACT | Status: DC
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
## 2018-09-09 NOTE — Progress Notes (Signed)
RT NOTE:  Pt arrives w/ EMS on CPAP. CPAP removed and pt placed on 4L . SpO2 100%.

## 2018-09-09 NOTE — ED Triage Notes (Signed)
Pt BIB [facility] for eval of severe SOB. Pt arrives on CPAP. Pt has hx of COPD, states he ran out of inhalers, steroids and nebs at home for a week. Attempted to use 4 nebs tonight prior to calling 911. EMS reports he had little to no air movement on their arrival, diffuse upper wheezes. Received 2g Mag, 125 solumedrol, 0.3mg  IM epi, arrives receiving duoneb by EMS.

## 2018-09-10 LAB — CBC WITH DIFFERENTIAL/PLATELET
ABS IMMATURE GRANULOCYTES: 0.04 10*3/uL (ref 0.00–0.07)
BASOS ABS: 0.1 10*3/uL (ref 0.0–0.1)
BASOS PCT: 1 %
Eosinophils Absolute: 0.7 10*3/uL — ABNORMAL HIGH (ref 0.0–0.5)
Eosinophils Relative: 8 %
HCT: 40.7 % (ref 39.0–52.0)
Hemoglobin: 13.1 g/dL (ref 13.0–17.0)
IMMATURE GRANULOCYTES: 0 %
Lymphocytes Relative: 16 %
Lymphs Abs: 1.4 10*3/uL (ref 0.7–4.0)
MCH: 29.4 pg (ref 26.0–34.0)
MCHC: 32.2 g/dL (ref 30.0–36.0)
MCV: 91.3 fL (ref 80.0–100.0)
MONO ABS: 0.9 10*3/uL (ref 0.1–1.0)
Monocytes Relative: 10 %
NEUTROS ABS: 6.1 10*3/uL (ref 1.7–7.7)
NEUTROS PCT: 65 %
NRBC: 0 % (ref 0.0–0.2)
PLATELETS: 257 10*3/uL (ref 150–400)
RBC: 4.46 MIL/uL (ref 4.22–5.81)
RDW: 12.1 % (ref 11.5–15.5)
WBC: 9.2 10*3/uL (ref 4.0–10.5)

## 2018-09-10 LAB — COMPREHENSIVE METABOLIC PANEL
ALT: 16 U/L (ref 0–44)
ANION GAP: 8 (ref 5–15)
AST: 19 U/L (ref 15–41)
Albumin: 3.5 g/dL (ref 3.5–5.0)
Alkaline Phosphatase: 49 U/L (ref 38–126)
BILIRUBIN TOTAL: 0.3 mg/dL (ref 0.3–1.2)
BUN: 9 mg/dL (ref 6–20)
CO2: 23 mmol/L (ref 22–32)
Calcium: 8.4 mg/dL — ABNORMAL LOW (ref 8.9–10.3)
Chloride: 106 mmol/L (ref 98–111)
Creatinine, Ser: 1.12 mg/dL (ref 0.61–1.24)
GFR calc Af Amer: >60 mL/min (ref >60)
Glucose, Bld: 135 mg/dL — ABNORMAL HIGH (ref 70–99)
POTASSIUM: 3.4 mmol/L — AB (ref 3.5–5.1)
Sodium: 137 mmol/L (ref 135–145)
TOTAL PROTEIN: 5.9 g/dL — AB (ref 6.5–8.1)

## 2018-09-10 LAB — BRAIN NATRIURETIC PEPTIDE: B NATRIURETIC PEPTIDE 5: 16.3 pg/mL (ref 0.0–100.0)

## 2018-09-10 MED ORDER — ALBUTEROL SULFATE (2.5 MG/3ML) 0.083% IN NEBU: 2.5000 mg | INHALATION_SOLUTION | RESPIRATORY_TRACT | 0 refills | Status: DC | PRN

## 2018-09-10 MED ORDER — PREDNISONE 10 MG PO TABS: 20.0000 mg | ORAL_TABLET | Freq: Two times a day (BID) | ORAL | 0 refills | Status: DC

## 2018-09-10 MED FILL — predniSONE 10 MG TABS: 10 | 5 days supply | Qty: 20 | Fill #0

## 2018-09-10 NOTE — ED Notes (Signed)
Pt ambulated in hall. O2 93 percent through ambulation.

## 2018-09-10 NOTE — ED Notes (Signed)
Patient verbalizes understanding of discharge instructions. Opportunity for questioning and answers were provided. Armband removed by staff, pt discharged from ED ambulatory w/ wife 

## 2018-09-10 NOTE — Discharge Instructions (Addendum)
Prednisone as prescribed.  Albuterol nebulizer treatment every 4 hours as needed for wheezing.  Return to the emergency department if you develop worsening breathing, severe chest pain, or other new or worsening symptoms.

## 2018-10-05 ENCOUNTER — Encounter (HOSPITAL_COMMUNITY): Payer: Self-pay | Admitting: Emergency Medicine

## 2018-10-05 ENCOUNTER — Emergency Department (HOSPITAL_COMMUNITY): Payer: Self-pay

## 2018-10-05 ENCOUNTER — Emergency Department (HOSPITAL_COMMUNITY)
Admission: EM | Admit: 2018-10-05 | Discharge: 2018-10-05 | Disposition: A | Discharge status: Home / Self Care | Payer: Self-pay | Attending: Emergency Medicine | Admitting: Emergency Medicine

## 2018-10-05 ENCOUNTER — Other Ambulatory Visit: Payer: Self-pay

## 2018-10-05 DIAGNOSIS — Z79899 Other long term (current) drug therapy: Secondary | ICD-10-CM | POA: Insufficient documentation

## 2018-10-05 DIAGNOSIS — J441 Chronic obstructive pulmonary disease with (acute) exacerbation: Secondary | ICD-10-CM | POA: Insufficient documentation

## 2018-10-05 DIAGNOSIS — J45909 Unspecified asthma, uncomplicated: Secondary | ICD-10-CM | POA: Insufficient documentation

## 2018-10-05 DIAGNOSIS — F17220 Nicotine dependence, chewing tobacco, uncomplicated: Secondary | ICD-10-CM | POA: Insufficient documentation

## 2018-10-05 LAB — I-STAT TROPONIN, ED: Troponin i, poc: 0 ng/mL (ref 0.00–0.08)

## 2018-10-05 LAB — CBC WITH DIFFERENTIAL/PLATELET
Abs Immature Granulocytes: 0.02 10*3/uL (ref 0.00–0.07)
Basophils Absolute: 0 10*3/uL (ref 0.0–0.1)
Basophils Relative: 0 %
EOS ABS: 0.5 10*3/uL (ref 0.0–0.5)
EOS PCT: 5 %
HEMATOCRIT: 39.8 % (ref 39.0–52.0)
Hemoglobin: 12.8 g/dL — ABNORMAL LOW (ref 13.0–17.0)
IMMATURE GRANULOCYTES: 0 %
Lymphocytes Relative: 13 %
Lymphs Abs: 1.2 10*3/uL (ref 0.7–4.0)
MCH: 29.8 pg (ref 26.0–34.0)
MCHC: 32.2 g/dL (ref 30.0–36.0)
MCV: 92.6 fL (ref 80.0–100.0)
MONOS PCT: 9 %
Monocytes Absolute: 0.8 10*3/uL (ref 0.1–1.0)
NEUTROS PCT: 73 %
NRBC: 0 % (ref 0.0–0.2)
Neutro Abs: 6.7 10*3/uL (ref 1.7–7.7)
Platelets: 219 10*3/uL (ref 150–400)
RBC: 4.3 MIL/uL (ref 4.22–5.81)
RDW: 13.1 % (ref 11.5–15.5)
WBC: 9.2 10*3/uL (ref 4.0–10.5)

## 2018-10-05 LAB — BASIC METABOLIC PANEL
ANION GAP: 6 (ref 5–15)
BUN: 10 mg/dL (ref 6–20)
CALCIUM: 8.5 mg/dL — AB (ref 8.9–10.3)
CO2: 23 mmol/L (ref 22–32)
CREATININE: 1.04 mg/dL (ref 0.61–1.24)
Chloride: 106 mmol/L (ref 98–111)
GFR calc Af Amer: >60 mL/min (ref >60)
GFR calc non Af Amer: >60 mL/min (ref >60)
GLUCOSE: 159 mg/dL — AB (ref 70–99)
Potassium: 4 mmol/L (ref 3.5–5.1)
Sodium: 135 mmol/L (ref 135–145)

## 2018-10-05 LAB — D-DIMER, QUANTITATIVE (NOT AT ARMC)

## 2018-10-05 IMAGING — CR DG CHEST 2V
2 series · 2 of 2 positions shown · non-contrast
Comparison: Chest x-ray [DATE].

CLINICAL DATA: 52-year-old male with history of respiratory
distress. Productive cough with yellow sputum.

EXAM:
CHEST - 2 VIEW

[chest ap]
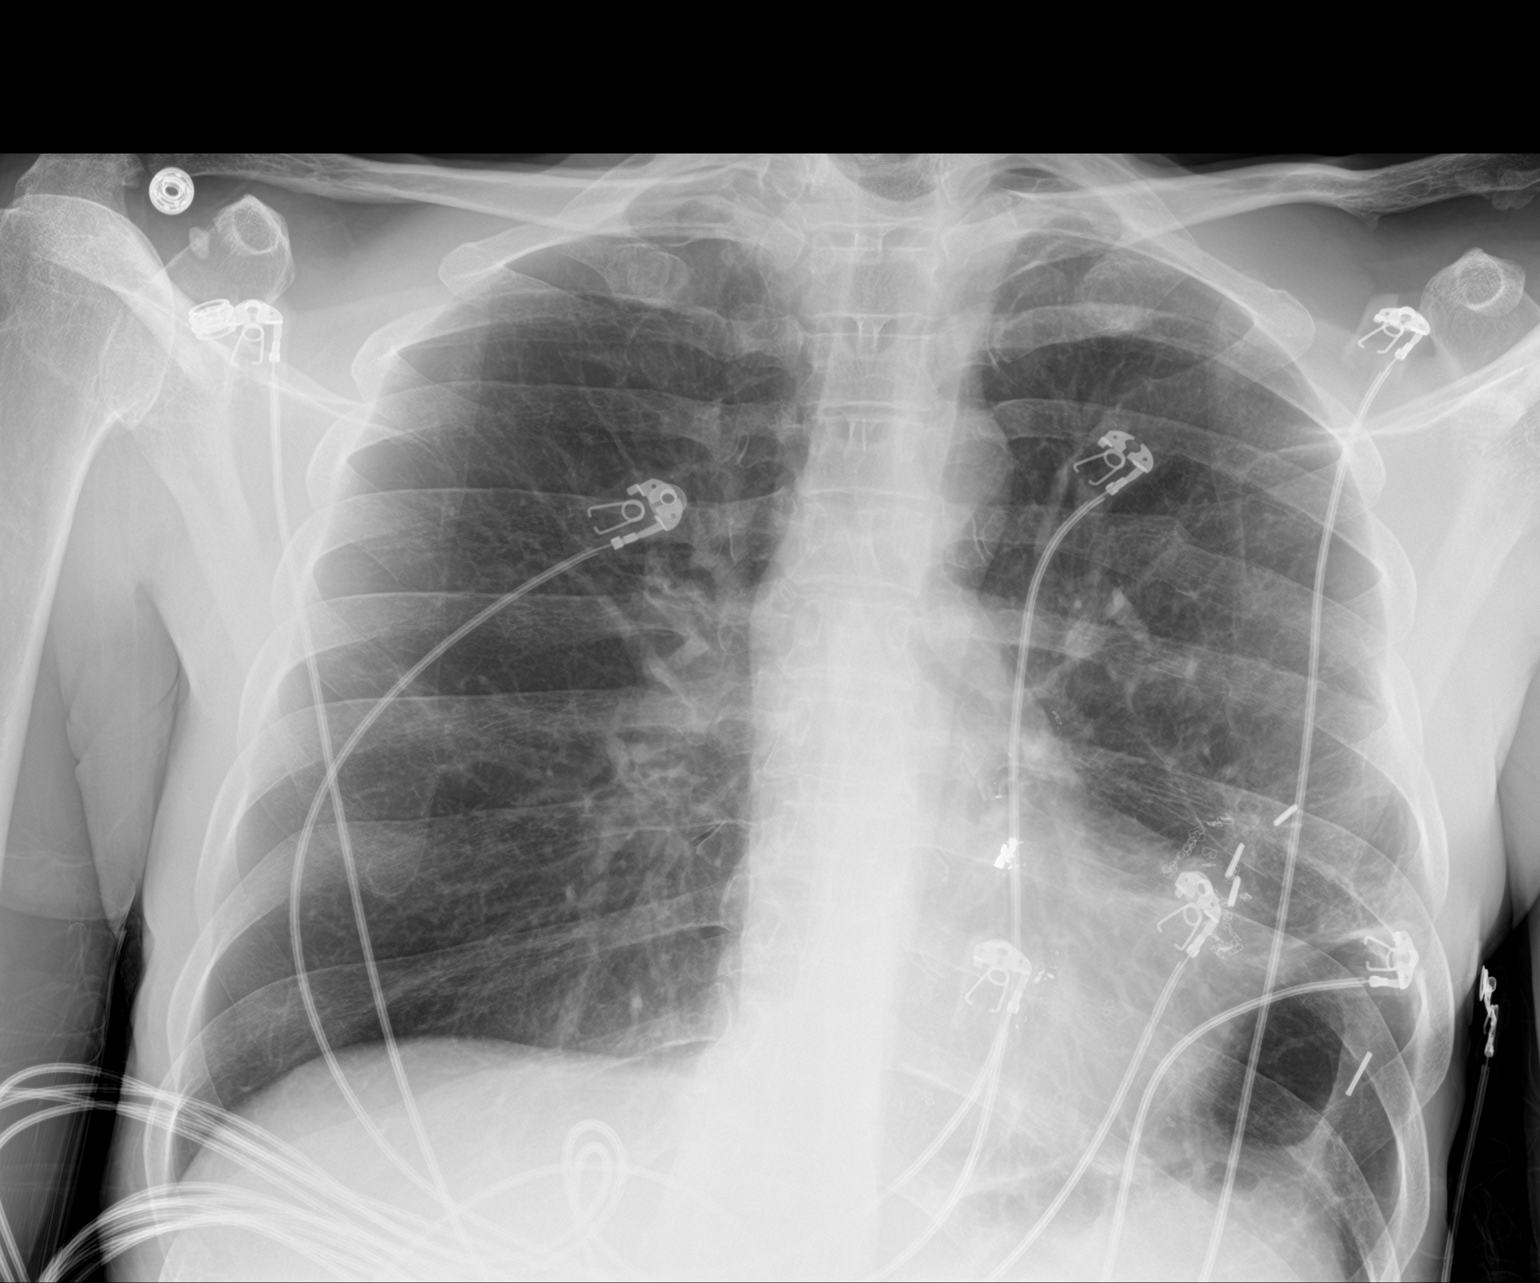

[chest lat]
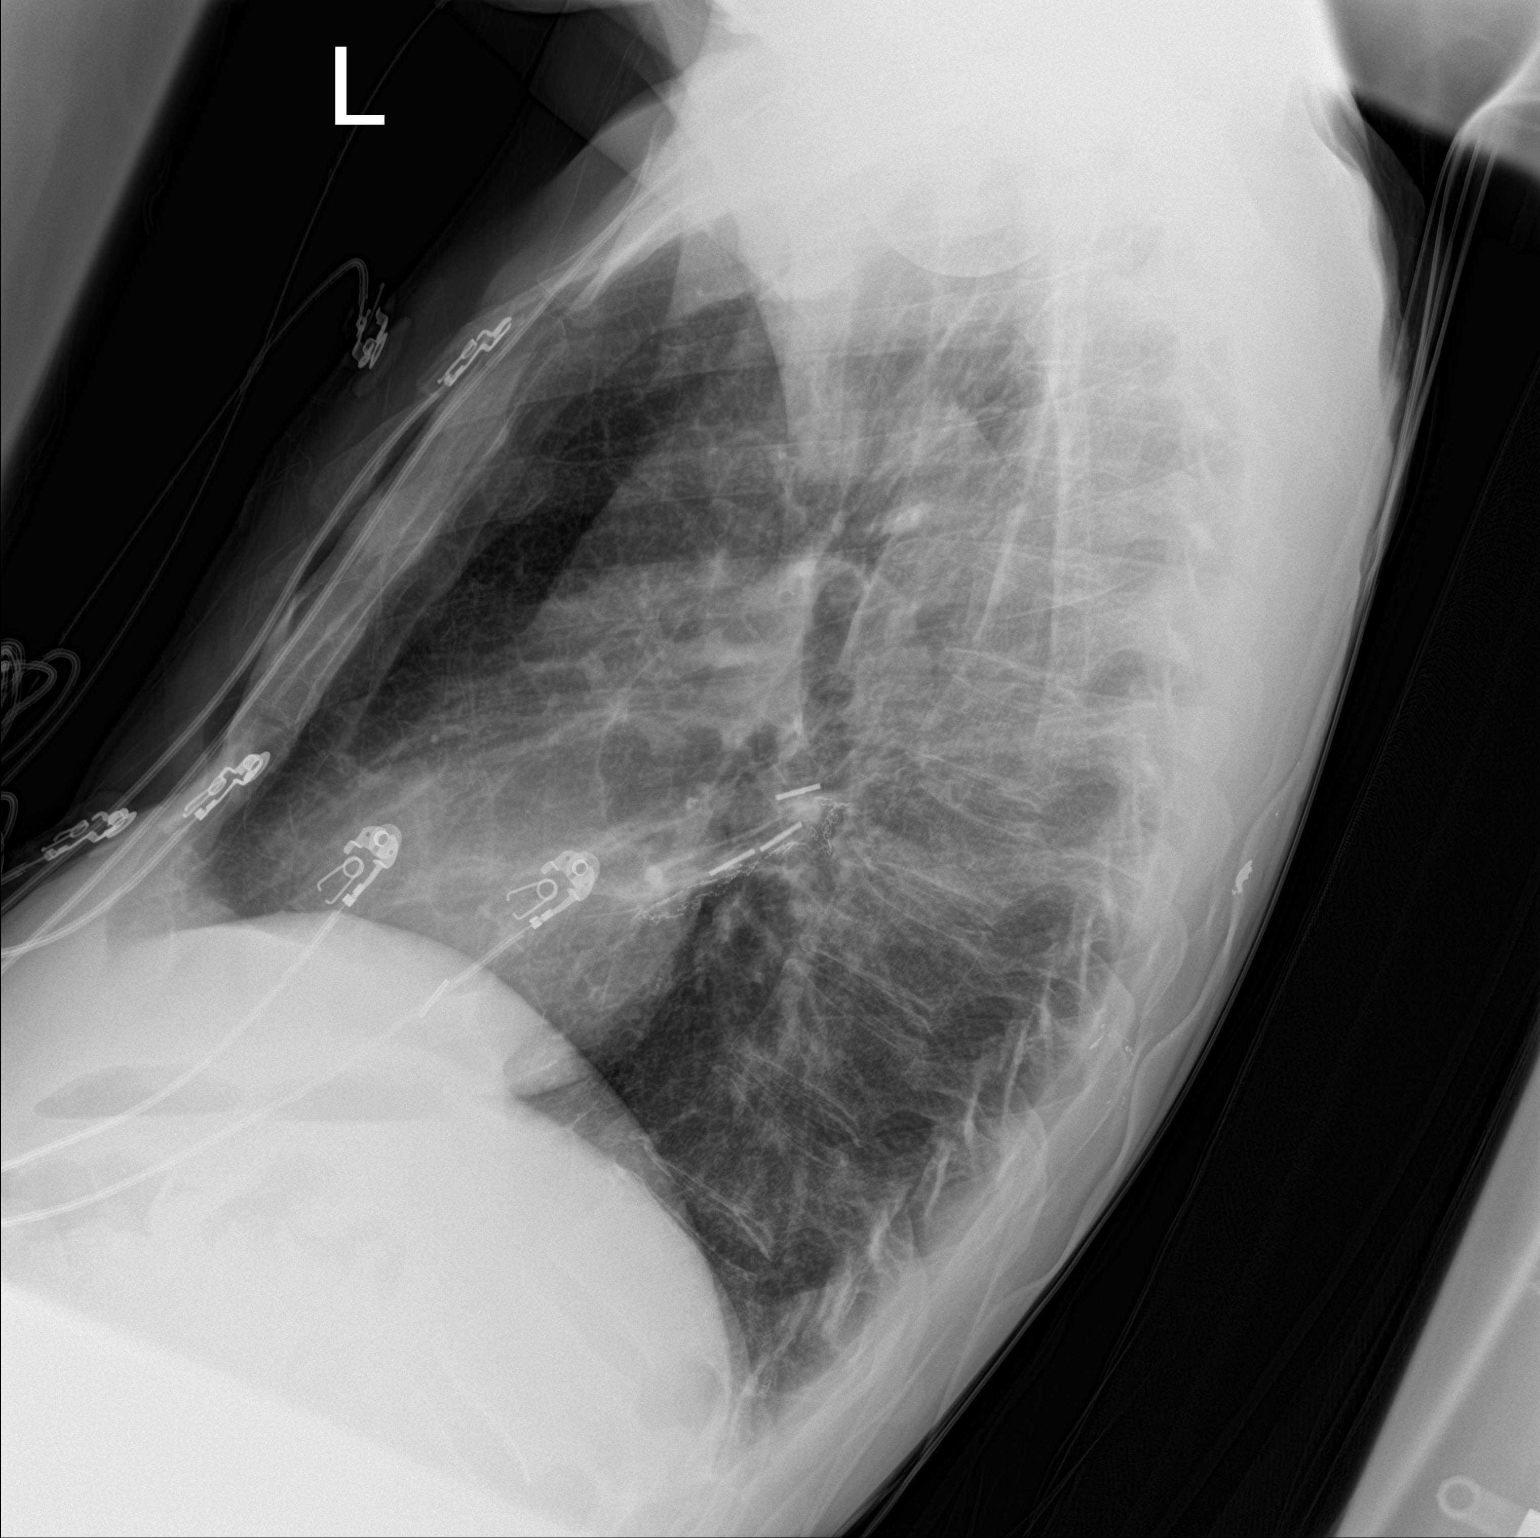

[2 of 2 positions shown; findings below may reference images not displayed]

FINDINGS: Postoperative changes of wedge resection are again noted in the left
mid lung. Lung volumes are normal. No consolidative airspace
disease. No pleural effusions. No pneumothorax. No pulmonary nodule
or mass noted. Pulmonary vasculature and the cardiomediastinal
silhouette are within normal limits.
IMPRESSION: 1.  No radiographic evidence of acute cardiopulmonary disease.

## 2018-10-05 MED ORDER — PREDNISONE 20 MG PO TABS: ORAL_TABLET | ORAL | 0 refills | Status: DC

## 2018-10-05 MED ORDER — DOXYCYCLINE HYCLATE 100 MG PO CAPS: 100.0000 mg | ORAL_CAPSULE | Freq: Two times a day (BID) | ORAL | 0 refills | Status: DC

## 2018-10-05 NOTE — ED Triage Notes (Signed)
PER [facility], pt picked up from home due to respiratory distress. Hx of copd,pt has been out of meds for about a week. Reports productive cough with yellow sputum
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
## 2018-10-06 MED FILL — DOXYCYCLINE HYCLATE 100 MG: 100 | 7 days supply | Qty: 14 | Fill #0

## 2018-10-06 MED FILL — ALBUTEROL SUL 2.5 MG/3 ML S: (2.5 MG/3ML | 14 days supply | Qty: 90 | Fill #0

## 2018-10-06 MED FILL — predniSONE 20 MG TABS: 20 | 4 days supply | Qty: 8 | Fill #0

## 2018-10-27 ENCOUNTER — Emergency Department (HOSPITAL_COMMUNITY): Payer: Self-pay

## 2018-10-27 ENCOUNTER — Other Ambulatory Visit: Payer: Self-pay

## 2018-10-27 ENCOUNTER — Encounter (HOSPITAL_COMMUNITY): Payer: Self-pay | Admitting: Pharmacy Technician

## 2018-10-27 ENCOUNTER — Emergency Department (HOSPITAL_COMMUNITY)
Admission: EM | Admit: 2018-10-27 | Discharge: 2018-10-27 | Disposition: A | Discharge status: Home / Self Care | Payer: Self-pay | Attending: Emergency Medicine | Admitting: Emergency Medicine

## 2018-10-27 DIAGNOSIS — Z87891 Personal history of nicotine dependence: Secondary | ICD-10-CM | POA: Insufficient documentation

## 2018-10-27 DIAGNOSIS — J441 Chronic obstructive pulmonary disease with (acute) exacerbation: Secondary | ICD-10-CM | POA: Insufficient documentation

## 2018-10-27 DIAGNOSIS — F909 Attention-deficit hyperactivity disorder, unspecified type: Secondary | ICD-10-CM | POA: Insufficient documentation

## 2018-10-27 DIAGNOSIS — Z79899 Other long term (current) drug therapy: Secondary | ICD-10-CM | POA: Insufficient documentation

## 2018-10-27 LAB — CBC WITH DIFFERENTIAL/PLATELET
Abs Immature Granulocytes: 0.03 10*3/uL (ref 0.00–0.07)
BASOS ABS: 0 10*3/uL (ref 0.0–0.1)
Basophils Relative: 0 %
EOS ABS: 0.4 10*3/uL (ref 0.0–0.5)
EOS PCT: 4 %
HEMATOCRIT: 40.7 % (ref 39.0–52.0)
Hemoglobin: 13.2 g/dL (ref 13.0–17.0)
Immature Granulocytes: 0 %
LYMPHS ABS: 0.8 10*3/uL (ref 0.7–4.0)
LYMPHS PCT: 9 %
MCH: 30.2 pg (ref 26.0–34.0)
MCHC: 32.4 g/dL (ref 30.0–36.0)
MCV: 93.1 fL (ref 80.0–100.0)
MONOS PCT: 7 %
Monocytes Absolute: 0.6 10*3/uL (ref 0.1–1.0)
Neutro Abs: 6.6 10*3/uL (ref 1.7–7.7)
Neutrophils Relative %: 80 %
Platelets: 194 10*3/uL (ref 150–400)
RBC: 4.37 MIL/uL (ref 4.22–5.81)
RDW: 12.8 % (ref 11.5–15.5)
WBC: 8.4 10*3/uL (ref 4.0–10.5)
nRBC: 0 % (ref 0.0–0.2)

## 2018-10-27 LAB — BASIC METABOLIC PANEL
Anion gap: 7 (ref 5–15)
BUN: 13 mg/dL (ref 6–20)
CALCIUM: 9.1 mg/dL (ref 8.9–10.3)
CHLORIDE: 107 mmol/L (ref 98–111)
CO2: 26 mmol/L (ref 22–32)
CREATININE: 0.92 mg/dL (ref 0.61–1.24)
GFR calc Af Amer: >60 mL/min (ref >60)
GFR calc non Af Amer: >60 mL/min (ref >60)
GLUCOSE: 154 mg/dL — AB (ref 70–99)
Potassium: 3.7 mmol/L (ref 3.5–5.1)
Sodium: 140 mmol/L (ref 135–145)

## 2018-10-27 IMAGING — DX DG CHEST 1V PORT
2 series · 2 of 2 positions shown · non-contrast
Comparison: [DATE]

CLINICAL DATA: Sudden onset dyspnea earlier today.

EXAM:
PORTABLE CHEST 1 VIEW

[chest ap (1 of 2)]
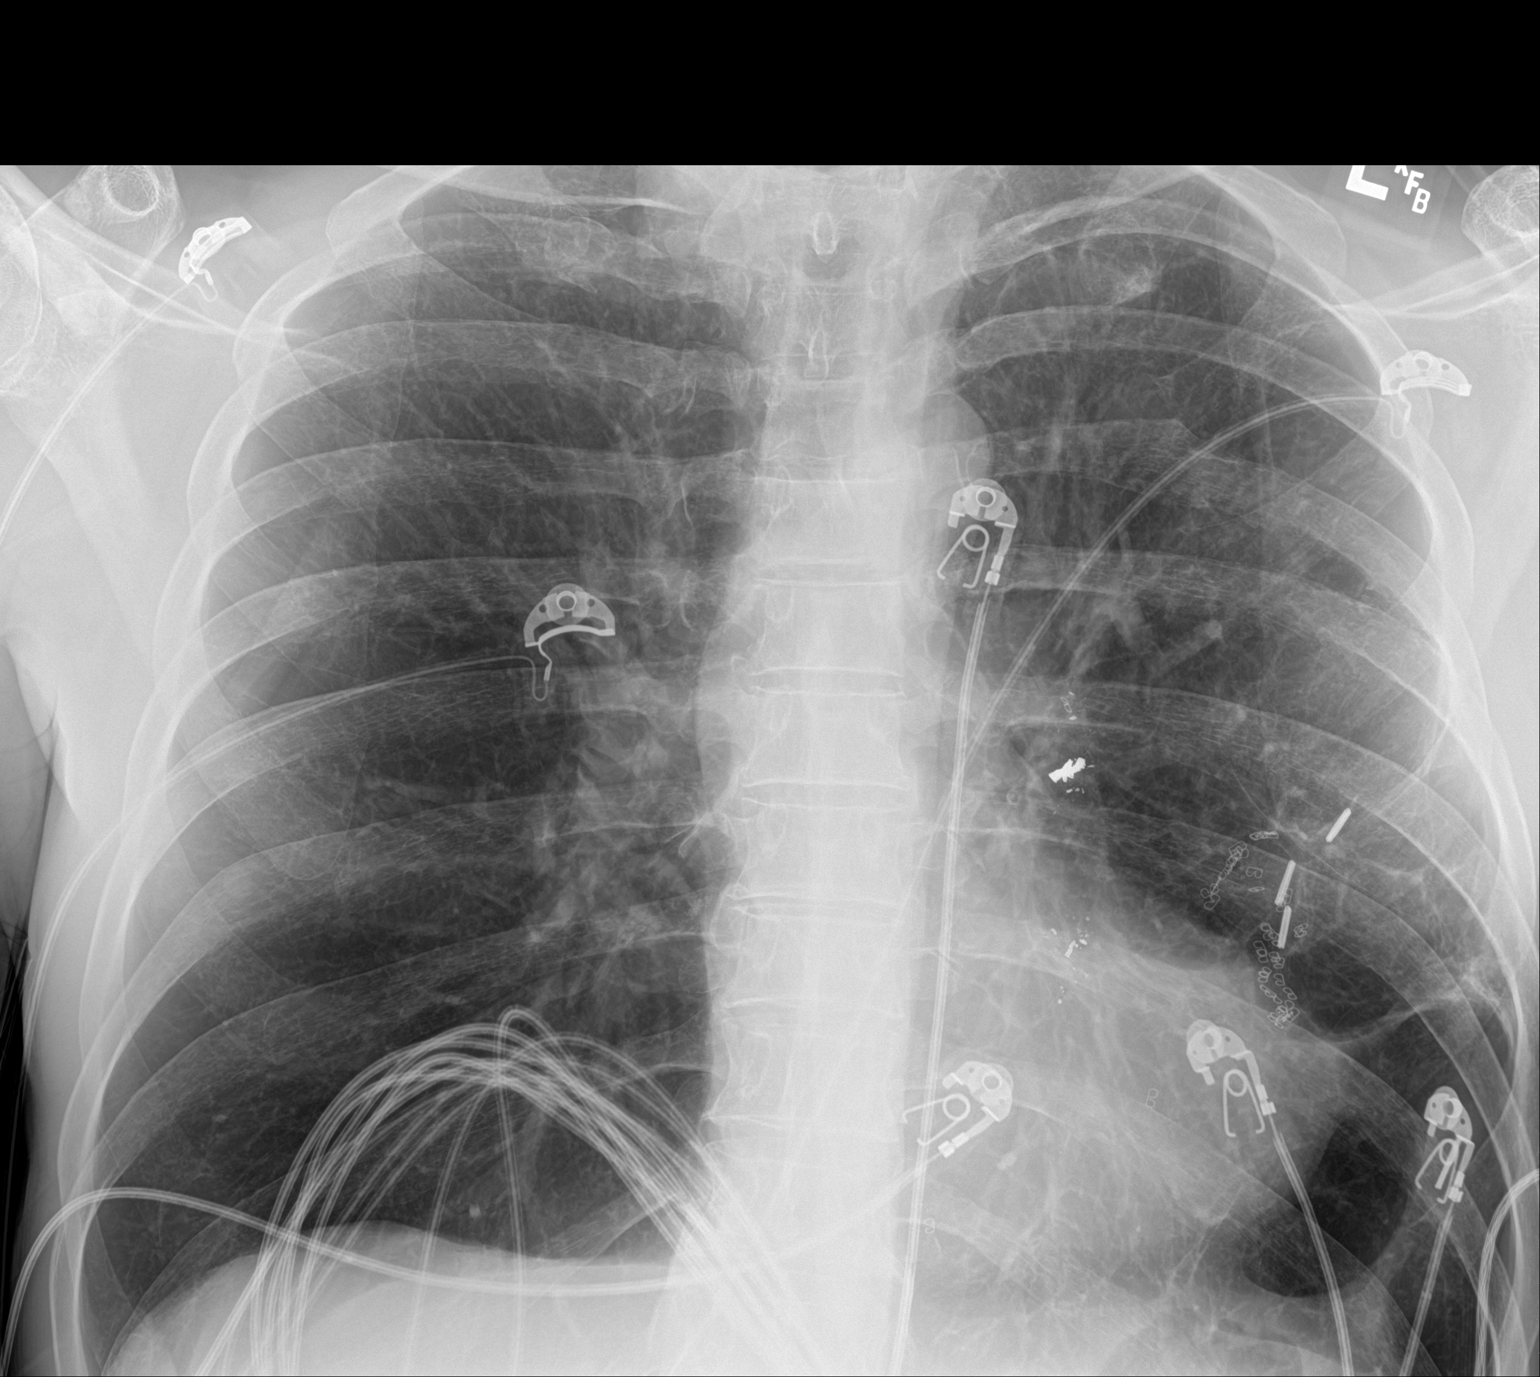

[chest ap (2 of 2)]
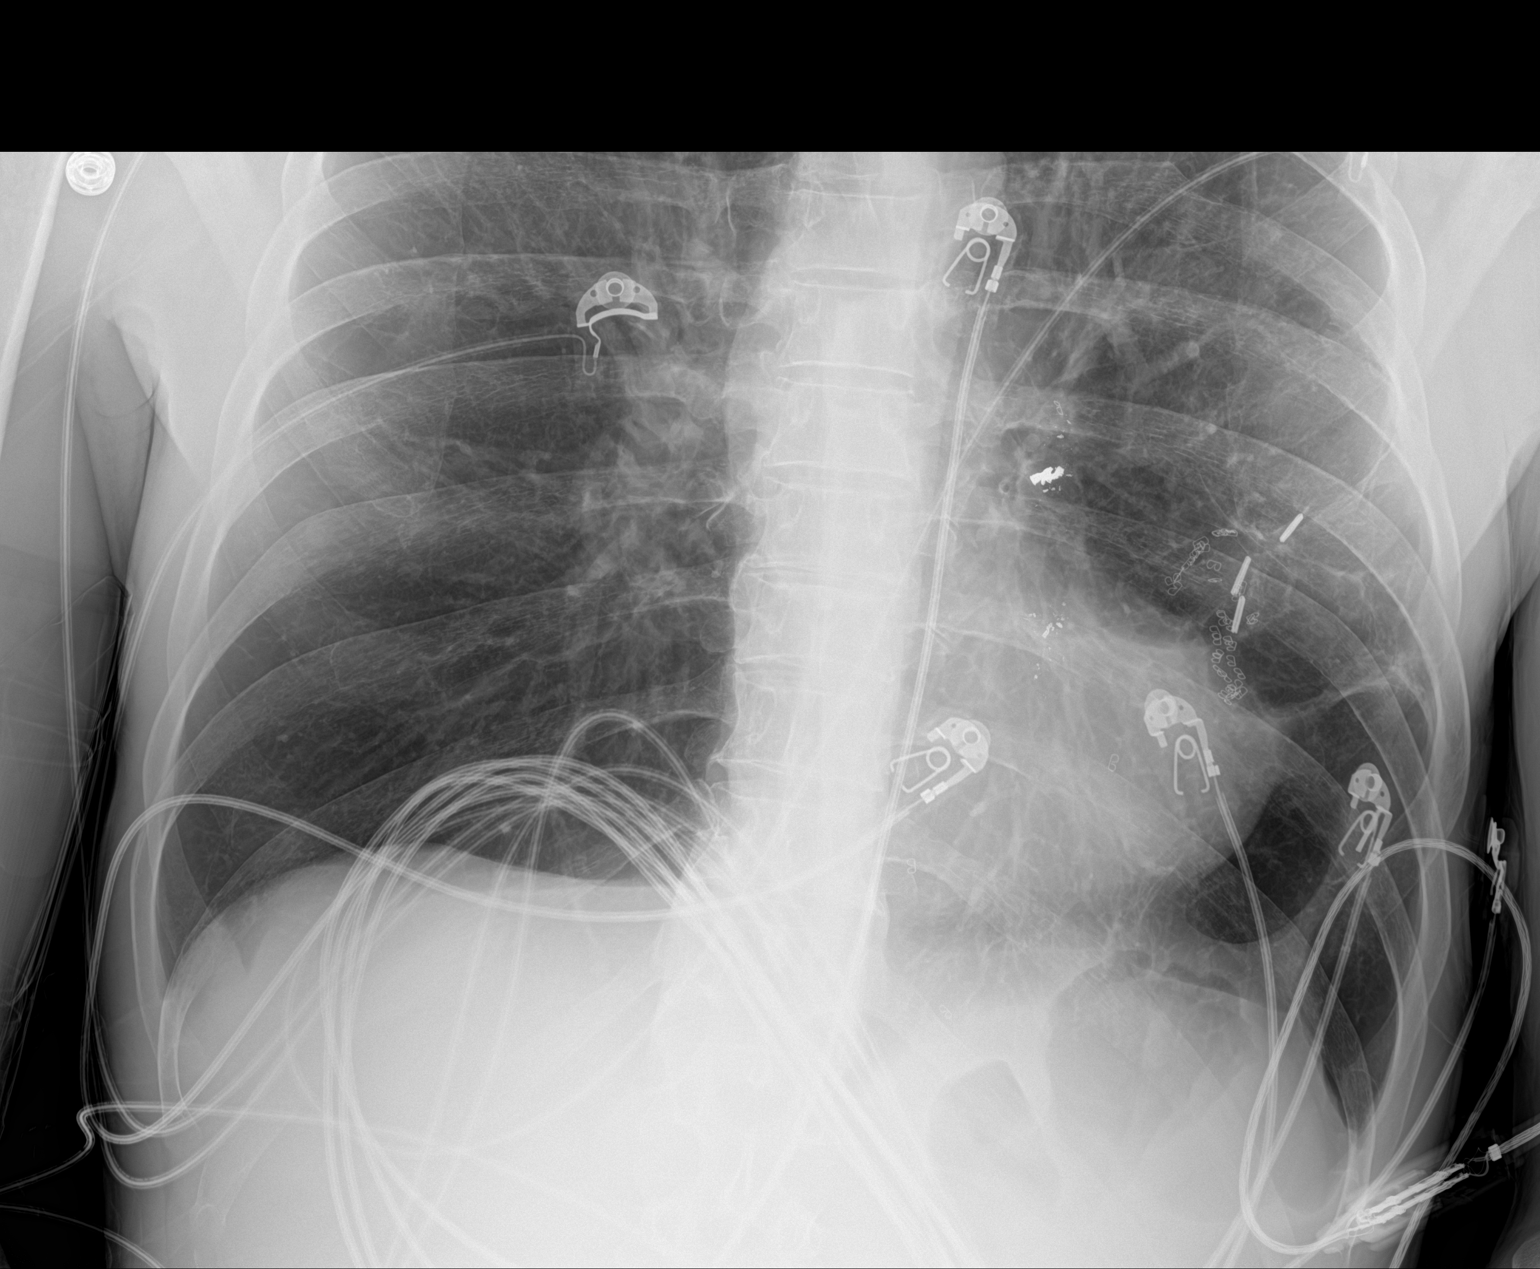

[2 of 2 positions shown; findings below may reference images not displayed]

FINDINGS: Stable postop change with surgical chain sutures and clips status
post wedge resection involving the left mid lung. Heart and
mediastinal contours are stable and within normal limits. No acute
pulmonary consolidation, edema or, effusion or pneumothorax.
IMPRESSION: No active disease.

## 2018-10-27 MED ORDER — AEROCHAMBER PLUS FLO-VU MISC
1.0000 | Freq: Once | Status: AC
  Administered 2018-10-27: 1
  Filled 2018-10-27: qty 1

## 2018-10-27 MED ORDER — PREDNISONE 20 MG PO TABS: ORAL_TABLET | ORAL | 0 refills | Status: DC

## 2018-10-27 MED ORDER — IPRATROPIUM BROMIDE 0.02 % IN SOLN
RESPIRATORY_TRACT | Status: AC
  Administered 2018-10-27: 0.5 mg
  Filled 2018-10-27: qty 2.5

## 2018-10-27 MED ORDER — ALBUTEROL SULFATE HFA 108 (90 BASE) MCG/ACT IN AERS
2.0000 | INHALATION_SPRAY | RESPIRATORY_TRACT | Status: DC | PRN
  Administered 2018-10-27: 2 via RESPIRATORY_TRACT
  Filled 2018-10-27: qty 6.7

## 2018-10-27 MED ORDER — ALBUTEROL (5 MG/ML) CONTINUOUS INHALATION SOLN
10.0000 mg/h | INHALATION_SOLUTION | Freq: Once | RESPIRATORY_TRACT | Status: AC
  Administered 2018-10-27: 10 mg/h via RESPIRATORY_TRACT
  Filled 2018-10-27: qty 20

## 2018-10-27 MED ORDER — AEROCHAMBER PLUS FLO-VU LARGE MISC
Status: AC
  Administered 2018-10-27: 1
  Filled 2018-10-27: qty 1

## 2018-10-27 NOTE — ED Notes (Signed)
Patient verbalizes understanding of discharge instructions. Opportunity for questioning and answers were provided. 

## 2018-10-27 NOTE — ED Notes (Signed)
Ambulated pt approx. 11' and his oxygen range was 94-96%

## 2018-10-27 NOTE — Discharge Instructions (Signed)
Use your albuterol inhaler 2 puffs every 4 hours as needed for shortness of breath.  Return to the emergency department or see your doctor if needed more than every 4 hours.  Start taking prednisone as prescribed tomorrow morning.
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
## 2018-10-27 NOTE — ED Triage Notes (Signed)
Pt bib EMS from home on CPAP. Pt reports sudden onset sob earlier today. Pt with wheezing in all lung fields per ems. Given 10mg  albuterol, 0.5mg  atrovent, 125mg  solumedrol, 2mg  magnesium sulfate given en route without much improvement. Pt placed on CPAP en route. 142/92, HR 110, RR 26, 98% on CPAP. Pt endorses fevers.

## 2018-10-27 NOTE — ED Notes (Signed)
Pt refused rectal temp. EDP made aware.

## 2018-10-28 MED FILL — predniSONE 20 MG TABS: 20 | 5 days supply | Qty: 10 | Fill #0

## 2018-11-22 ENCOUNTER — Emergency Department (HOSPITAL_COMMUNITY): Payer: Self-pay

## 2018-11-22 ENCOUNTER — Emergency Department (HOSPITAL_COMMUNITY)
Admission: EM | Admit: 2018-11-22 | Discharge: 2018-11-22 | Disposition: A | Discharge status: Home / Self Care | Payer: Self-pay | Attending: Emergency Medicine | Admitting: Emergency Medicine

## 2018-11-22 ENCOUNTER — Other Ambulatory Visit: Payer: Self-pay

## 2018-11-22 ENCOUNTER — Encounter (HOSPITAL_COMMUNITY): Payer: Self-pay | Admitting: Emergency Medicine

## 2018-11-22 DIAGNOSIS — R05 Cough: Secondary | ICD-10-CM

## 2018-11-22 DIAGNOSIS — R0902 Hypoxemia: Secondary | ICD-10-CM | POA: Insufficient documentation

## 2018-11-22 DIAGNOSIS — R059 Cough, unspecified: Secondary | ICD-10-CM

## 2018-11-22 DIAGNOSIS — F909 Attention-deficit hyperactivity disorder, unspecified type: Secondary | ICD-10-CM | POA: Insufficient documentation

## 2018-11-22 DIAGNOSIS — J441 Chronic obstructive pulmonary disease with (acute) exacerbation: Secondary | ICD-10-CM | POA: Insufficient documentation

## 2018-11-22 DIAGNOSIS — R0602 Shortness of breath: Secondary | ICD-10-CM

## 2018-11-22 DIAGNOSIS — Z87891 Personal history of nicotine dependence: Secondary | ICD-10-CM | POA: Insufficient documentation

## 2018-11-22 DIAGNOSIS — Z79899 Other long term (current) drug therapy: Secondary | ICD-10-CM | POA: Insufficient documentation

## 2018-11-22 LAB — COMPREHENSIVE METABOLIC PANEL
ALT: 17 U/L (ref 0–44)
ANION GAP: 13 (ref 5–15)
AST: 19 U/L (ref 15–41)
Albumin: 4.4 g/dL (ref 3.5–5.0)
Alkaline Phosphatase: 46 U/L (ref 38–126)
BUN: 17 mg/dL (ref 6–20)
CO2: 22 mmol/L (ref 22–32)
Calcium: 9.2 mg/dL (ref 8.9–10.3)
Chloride: 101 mmol/L (ref 98–111)
Creatinine, Ser: 1.21 mg/dL (ref 0.61–1.24)
Glucose, Bld: 205 mg/dL — ABNORMAL HIGH (ref 70–99)
Potassium: 3.4 mmol/L — ABNORMAL LOW (ref 3.5–5.1)
SODIUM: 136 mmol/L (ref 135–145)
Total Bilirubin: 0.6 mg/dL (ref 0.3–1.2)
Total Protein: 6.9 g/dL (ref 6.5–8.1)

## 2018-11-22 LAB — I-STAT TROPONIN, ED
TROPONIN I, POC: 0 ng/mL (ref 0.00–0.08)
Troponin i, poc: 0.01 ng/mL (ref 0.00–0.08)

## 2018-11-22 LAB — CBC WITH DIFFERENTIAL/PLATELET
ABS IMMATURE GRANULOCYTES: 0.05 10*3/uL (ref 0.00–0.07)
BASOS PCT: 0 %
Basophils Absolute: 0 10*3/uL (ref 0.0–0.1)
EOS ABS: 0.4 10*3/uL (ref 0.0–0.5)
EOS PCT: 3 %
HCT: 41.1 % (ref 39.0–52.0)
Hemoglobin: 13.6 g/dL (ref 13.0–17.0)
Immature Granulocytes: 0 %
Lymphocytes Relative: 7 %
Lymphs Abs: 1 10*3/uL (ref 0.7–4.0)
MCH: 30.3 pg (ref 26.0–34.0)
MCHC: 33.1 g/dL (ref 30.0–36.0)
MCV: 91.5 fL (ref 80.0–100.0)
MONO ABS: 1.5 10*3/uL — AB (ref 0.1–1.0)
Monocytes Relative: 11 %
NEUTROS ABS: 10.6 10*3/uL — AB (ref 1.7–7.7)
Neutrophils Relative %: 79 %
Platelets: 244 10*3/uL (ref 150–400)
RBC: 4.49 MIL/uL (ref 4.22–5.81)
RDW: 11.9 % (ref 11.5–15.5)
WBC: 13.6 10*3/uL — AB (ref 4.0–10.5)
nRBC: 0 % (ref 0.0–0.2)

## 2018-11-22 LAB — LIPASE, BLOOD: LIPASE: 26 U/L (ref 11–51)

## 2018-11-22 LAB — I-STAT CG4 LACTIC ACID, ED: LACTIC ACID, VENOUS: 1.43 mmol/L (ref 0.5–1.9)
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
## 2018-11-22 MED ORDER — ALBUTEROL SULFATE HFA 108 (90 BASE) MCG/ACT IN AERS
2.0000 | INHALATION_SPRAY | Freq: Once | RESPIRATORY_TRACT | Status: AC
  Administered 2018-11-22: 2 via RESPIRATORY_TRACT
  Filled 2018-11-22: qty 6.7

## 2018-11-22 MED ORDER — ALBUTEROL (5 MG/ML) CONTINUOUS INHALATION SOLN
10.0000 mg/h | INHALATION_SOLUTION | Freq: Once | RESPIRATORY_TRACT | Status: AC
  Administered 2018-11-22: 10 mg/h via RESPIRATORY_TRACT
  Filled 2018-11-22: qty 20

## 2018-11-22 MED ORDER — PREDNISONE 50 MG PO TABS: 50.0000 mg | ORAL_TABLET | Freq: Every day | ORAL | 0 refills | Status: DC

## 2018-11-22 NOTE — Discharge Instructions (Addendum)
We suspect you had a COPD exacerbation contributing to your symptoms today.  We offered admission due to the continued tachycardia and amount of wheezing you still have despite interventions however, you wanted to go home.  We did not see evidence of pneumonia and your work-up was otherwise reassuring.  As your breathing improved, we feel you are safe for oral steroids and albuterol inhaler.  Please follow-up with your primary doctor and return to the nearest emergency department if any symptoms change or worsen.

## 2018-11-22 NOTE — ED Notes (Signed)
Patient O2 sat is 97% while ambulating. Patient ambulated with no complaints.
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
## 2018-11-22 NOTE — ED Notes (Signed)
Pt reports SOB and calling 9-1-1but is feeling better now.
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
## 2018-11-22 NOTE — ED Notes (Signed)
Patient verbalizes understanding of discharge instructions. Opportunity for questioning and answers were provided. Armband removed by staff, pt discharged from ED home via POV with family. 
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
## 2018-11-24 MED FILL — predniSONE 10 MG TABS: 10 | 4 days supply | Qty: 20 | Fill #0

## 2019-02-27 ENCOUNTER — Emergency Department (HOSPITAL_COMMUNITY): Payer: Self-pay

## 2019-02-27 ENCOUNTER — Encounter (HOSPITAL_COMMUNITY): Payer: Self-pay | Admitting: *Deleted

## 2019-02-27 ENCOUNTER — Other Ambulatory Visit: Payer: Self-pay

## 2019-02-27 ENCOUNTER — Emergency Department (HOSPITAL_COMMUNITY)
Admission: EM | Admit: 2019-02-27 | Discharge: 2019-02-28 | Discharge status: Against Medical Advice | Payer: Self-pay | Attending: Emergency Medicine | Admitting: Emergency Medicine

## 2019-02-27 DIAGNOSIS — Z5321 Procedure and treatment not carried out due to patient leaving prior to being seen by health care provider: Secondary | ICD-10-CM | POA: Insufficient documentation

## 2019-02-27 LAB — BASIC METABOLIC PANEL
Anion gap: 12 (ref 5–15)
BUN: 11 mg/dL (ref 6–20)
CO2: 23 mmol/L (ref 22–32)
Calcium: 9.9 mg/dL (ref 8.9–10.3)
Chloride: 106 mmol/L (ref 98–111)
Creatinine, Ser: 1.32 mg/dL — ABNORMAL HIGH (ref 0.61–1.24)
GFR calc Af Amer: >60 mL/min (ref >60)
GFR calc non Af Amer: >60 mL/min (ref >60)
Glucose, Bld: 119 mg/dL — ABNORMAL HIGH (ref 70–99)
Potassium: 4.1 mmol/L (ref 3.5–5.1)
Sodium: 141 mmol/L (ref 135–145)

## 2019-02-27 LAB — CBC
HCT: 45.5 % (ref 39.0–52.0)
Hemoglobin: 15.2 g/dL (ref 13.0–17.0)
MCH: 31.1 pg (ref 26.0–34.0)
MCHC: 33.4 g/dL (ref 30.0–36.0)
MCV: 93.2 fL (ref 80.0–100.0)
Platelets: 338 10*3/uL (ref 150–400)
RBC: 4.88 MIL/uL (ref 4.22–5.81)
RDW: 12.2 % (ref 11.5–15.5)
WBC: 12.3 10*3/uL — ABNORMAL HIGH (ref 4.0–10.5)
nRBC: 0 % (ref 0.0–0.2)
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
## 2019-02-27 MED ORDER — ALBUTEROL SULFATE HFA 108 (90 BASE) MCG/ACT IN AERS
8.0000 | INHALATION_SPRAY | Freq: Once | RESPIRATORY_TRACT | Status: AC
  Administered 2019-02-27: 8 via RESPIRATORY_TRACT
  Filled 2019-02-27: qty 6.7

## 2019-02-27 NOTE — ED Notes (Signed)
Called x 2 for vitals check, no response in lobby

## 2019-02-27 NOTE — ED Triage Notes (Signed)
Pt says that he has felt some SOB and productive cough since yesterday. No fevers, no travel. Hx of emphysema, has been out of his inhalers.

## 2019-02-28 ENCOUNTER — Emergency Department (HOSPITAL_COMMUNITY)
Admission: EM | Admit: 2019-02-28 | Discharge: 2019-03-01 | Disposition: A | Discharge status: Home / Self Care | Payer: Self-pay | Attending: Emergency Medicine | Admitting: Emergency Medicine

## 2019-02-28 ENCOUNTER — Encounter (HOSPITAL_COMMUNITY): Payer: Self-pay | Admitting: Emergency Medicine

## 2019-02-28 DIAGNOSIS — J441 Chronic obstructive pulmonary disease with (acute) exacerbation: Secondary | ICD-10-CM | POA: Insufficient documentation

## 2019-02-28 DIAGNOSIS — R0789 Other chest pain: Secondary | ICD-10-CM | POA: Insufficient documentation

## 2019-02-28 DIAGNOSIS — R062 Wheezing: Secondary | ICD-10-CM | POA: Insufficient documentation

## 2019-02-28 DIAGNOSIS — I4891 Unspecified atrial fibrillation: Secondary | ICD-10-CM | POA: Insufficient documentation

## 2019-02-28 DIAGNOSIS — R05 Cough: Secondary | ICD-10-CM | POA: Insufficient documentation

## 2019-02-28 DIAGNOSIS — F319 Bipolar disorder, unspecified: Secondary | ICD-10-CM | POA: Insufficient documentation

## 2019-02-28 DIAGNOSIS — F17290 Nicotine dependence, other tobacco product, uncomplicated: Secondary | ICD-10-CM | POA: Insufficient documentation

## 2019-02-28 LAB — CBC WITH DIFFERENTIAL/PLATELET
Abs Immature Granulocytes: 0.01 10*3/uL (ref 0.00–0.07)
Basophils Absolute: 0 10*3/uL (ref 0.0–0.1)
Basophils Relative: 1 %
Eosinophils Absolute: 0.4 10*3/uL (ref 0.0–0.5)
Eosinophils Relative: 6 %
HCT: 42 % (ref 39.0–52.0)
Hemoglobin: 14 g/dL (ref 13.0–17.0)
Immature Granulocytes: 0 %
Lymphocytes Relative: 20 %
Lymphs Abs: 1.3 10*3/uL (ref 0.7–4.0)
MCH: 30.2 pg (ref 26.0–34.0)
MCHC: 33.3 g/dL (ref 30.0–36.0)
MCV: 90.5 fL (ref 80.0–100.0)
Monocytes Absolute: 0.6 10*3/uL (ref 0.1–1.0)
Monocytes Relative: 9 %
Neutro Abs: 4.4 10*3/uL (ref 1.7–7.7)
Neutrophils Relative %: 64 %
Platelets: 234 10*3/uL (ref 150–400)
RBC: 4.64 MIL/uL (ref 4.22–5.81)
RDW: 12.4 % (ref 11.5–15.5)
WBC: 6.7 10*3/uL (ref 4.0–10.5)
nRBC: 0 % (ref 0.0–0.2)

## 2019-02-28 LAB — BASIC METABOLIC PANEL
Anion gap: 10 (ref 5–15)
BUN: 10 mg/dL (ref 6–20)
CO2: 22 mmol/L (ref 22–32)
Calcium: 9 mg/dL (ref 8.9–10.3)
Chloride: 104 mmol/L (ref 98–111)
Creatinine, Ser: 1.39 mg/dL — ABNORMAL HIGH (ref 0.61–1.24)
GFR calc Af Amer: >60 mL/min (ref >60)
GFR calc non Af Amer: 58 mL/min — ABNORMAL LOW (ref >60)
Glucose, Bld: 115 mg/dL — ABNORMAL HIGH (ref 70–99)
Potassium: 6.2 mmol/L — ABNORMAL HIGH (ref 3.5–5.1)
Sodium: 136 mmol/L (ref 135–145)

## 2019-02-28 MED ORDER — ALBUTEROL (5 MG/ML) CONTINUOUS INHALATION SOLN
10.0000 mg/h | INHALATION_SOLUTION | RESPIRATORY_TRACT | Status: AC
  Filled 2019-02-28: qty 20

## 2019-02-28 MED ORDER — AEROCHAMBER PLUS FLO-VU LARGE MISC
Status: AC
  Administered 2019-02-28: 1
  Filled 2019-02-28: qty 1

## 2019-02-28 MED ORDER — ALBUTEROL SULFATE HFA 108 (90 BASE) MCG/ACT IN AERS
3.0000 | INHALATION_SPRAY | Freq: Once | RESPIRATORY_TRACT | Status: AC
  Administered 2019-02-28: 3 via RESPIRATORY_TRACT
  Filled 2019-02-28: qty 6.7

## 2019-02-28 MED ORDER — MAGNESIUM SULFATE 2 GM/50ML IV SOLN
2.0000 g | INTRAVENOUS | Status: DC
  Administered 2019-02-28: 2 g via INTRAVENOUS
  Filled 2019-02-28: qty 50

## 2019-02-28 MED ORDER — IPRATROPIUM-ALBUTEROL 0.5-2.5 (3) MG/3ML IN SOLN
3.0000 mL | Freq: Once | RESPIRATORY_TRACT | Status: DC
  Filled 2019-02-28: qty 3
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
## 2019-02-28 NOTE — ED Notes (Signed)
No response for reassessment.  

## 2019-02-28 NOTE — ED Triage Notes (Signed)
Brought by ems from home for c/o sob with chest tightness for the last three days.  Ran out of albuterol inhaler but has been using mothers with no relief.  On arrival ems reports patient was wheezy and diminished.  Also endorsed nausea yesterday.  Given solumedrol 125mg  IV and 0.3 of epi in left shoulder pta.  Patient appears comfortable in triage.

## 2019-03-01 MED ORDER — ALBUTEROL SULFATE (2.5 MG/3ML) 0.083% IN NEBU: 2.5000 mg | INHALATION_SOLUTION | Freq: Four times a day (QID) | RESPIRATORY_TRACT | 12 refills | Status: DC | PRN

## 2019-03-01 MED ORDER — PREDNISONE 20 MG PO TABS: 40.0000 mg | ORAL_TABLET | Freq: Every day | ORAL | 0 refills | Status: DC

## 2019-03-01 NOTE — ED Notes (Signed)
Discharge insturctions and prescription discussed with pt. Pt has no questions at this time.

## 2019-03-01 NOTE — Discharge Instructions (Signed)
Please take your albuterol treatment every 6 hours as needed for shortness of breath.  Prednisone daily for 5 days.  Seek medical exam for severe or worsening difficulty breathing chest pain or fever

## 2019-03-04 ENCOUNTER — Other Ambulatory Visit: Payer: Self-pay

## 2019-03-04 ENCOUNTER — Emergency Department (HOSPITAL_COMMUNITY): Payer: Self-pay

## 2019-03-04 ENCOUNTER — Inpatient Hospital Stay (HOSPITAL_COMMUNITY)
Admission: EM | Admit: 2019-03-04 | Discharge: 2019-03-05 | DRG: 917 | Disposition: A | Discharge status: Home / Self Care | Payer: Self-pay | Attending: Emergency Medicine | Admitting: Emergency Medicine

## 2019-03-04 ENCOUNTER — Encounter (HOSPITAL_COMMUNITY): Payer: Self-pay | Admitting: Emergency Medicine

## 2019-03-04 DIAGNOSIS — Z79899 Other long term (current) drug therapy: Secondary | ICD-10-CM

## 2019-03-04 DIAGNOSIS — E876 Hypokalemia: Secondary | ICD-10-CM | POA: Diagnosis present

## 2019-03-04 DIAGNOSIS — G4710 Hypersomnia, unspecified: Secondary | ICD-10-CM | POA: Diagnosis present

## 2019-03-04 DIAGNOSIS — R0902 Hypoxemia: Secondary | ICD-10-CM

## 2019-03-04 DIAGNOSIS — I4891 Unspecified atrial fibrillation: Secondary | ICD-10-CM

## 2019-03-04 DIAGNOSIS — F19939 Other psychoactive substance use, unspecified with withdrawal, unspecified: Secondary | ICD-10-CM

## 2019-03-04 DIAGNOSIS — F19239 Other psychoactive substance dependence with withdrawal, unspecified: Secondary | ICD-10-CM

## 2019-03-04 DIAGNOSIS — I480 Paroxysmal atrial fibrillation: Secondary | ICD-10-CM | POA: Diagnosis present

## 2019-03-04 DIAGNOSIS — Y92009 Unspecified place in unspecified non-institutional (private) residence as the place of occurrence of the external cause: Secondary | ICD-10-CM

## 2019-03-04 DIAGNOSIS — Z7952 Long term (current) use of systemic steroids: Secondary | ICD-10-CM

## 2019-03-04 DIAGNOSIS — K219 Gastro-esophageal reflux disease without esophagitis: Secondary | ICD-10-CM | POA: Diagnosis present

## 2019-03-04 DIAGNOSIS — F1290 Cannabis use, unspecified, uncomplicated: Secondary | ICD-10-CM | POA: Diagnosis present

## 2019-03-04 DIAGNOSIS — G92 Toxic encephalopathy: Secondary | ICD-10-CM | POA: Diagnosis present

## 2019-03-04 DIAGNOSIS — T40601S Poisoning by unspecified narcotics, accidental (unintentional), sequela: Secondary | ICD-10-CM

## 2019-03-04 DIAGNOSIS — G251 Drug-induced tremor: Secondary | ICD-10-CM

## 2019-03-04 DIAGNOSIS — N289 Disorder of kidney and ureter, unspecified: Secondary | ICD-10-CM

## 2019-03-04 DIAGNOSIS — T40601A Poisoning by unspecified narcotics, accidental (unintentional), initial encounter: Principal | ICD-10-CM

## 2019-03-04 DIAGNOSIS — G9340 Encephalopathy, unspecified: Secondary | ICD-10-CM

## 2019-03-04 DIAGNOSIS — F1414 Cocaine abuse with cocaine-induced mood disorder: Secondary | ICD-10-CM

## 2019-03-04 DIAGNOSIS — J449 Chronic obstructive pulmonary disease, unspecified: Secondary | ICD-10-CM | POA: Diagnosis present

## 2019-03-04 DIAGNOSIS — J4489 Other specified chronic obstructive pulmonary disease: Secondary | ICD-10-CM

## 2019-03-04 DIAGNOSIS — Z825 Family history of asthma and other chronic lower respiratory diseases: Secondary | ICD-10-CM

## 2019-03-04 DIAGNOSIS — J441 Chronic obstructive pulmonary disease with (acute) exacerbation: Secondary | ICD-10-CM

## 2019-03-04 DIAGNOSIS — T405X1A Poisoning by cocaine, accidental (unintentional), initial encounter: Secondary | ICD-10-CM | POA: Diagnosis present

## 2019-03-04 DIAGNOSIS — F1123 Opioid dependence with withdrawal: Secondary | ICD-10-CM | POA: Diagnosis not present

## 2019-03-04 DIAGNOSIS — Z720 Tobacco use: Secondary | ICD-10-CM

## 2019-03-04 DIAGNOSIS — F1910 Other psychoactive substance abuse, uncomplicated: Secondary | ICD-10-CM

## 2019-03-04 DIAGNOSIS — F319 Bipolar disorder, unspecified: Secondary | ICD-10-CM

## 2019-03-04 LAB — RAPID URINE DRUG SCREEN, HOSP PERFORMED
Amphetamines: NOT DETECTED
Barbiturates: NOT DETECTED
Benzodiazepines: NOT DETECTED
Cocaine: POSITIVE — AB
Opiates: POSITIVE — AB
Tetrahydrocannabinol: NOT DETECTED

## 2019-03-04 LAB — BLOOD GAS, ARTERIAL
Acid-Base Excess: 1 mmol/L (ref 0.0–2.0)
Bicarbonate: 26.3 mmol/L (ref 20.0–28.0)
Drawn by: 422461
O2 Content: 3.5 L/min
O2 Saturation: 90.2 %
Patient temperature: 97.6
pCO2 arterial: 45.5 mmHg (ref 32.0–48.0)
pH, Arterial: 7.377 (ref 7.350–7.450)
pO2, Arterial: 60 mmHg — ABNORMAL LOW (ref 83.0–108.0)

## 2019-03-04 LAB — TROPONIN I
Troponin I: 0.04 ng/mL (ref <0.03)
Troponin I: 0.07 ng/mL (ref <0.03)
Troponin I: 0.07 ng/mL (ref <0.03)
Troponin I: 0.04 ng/mL (ref <0.03)

## 2019-03-04 LAB — CBC WITH DIFFERENTIAL/PLATELET
Abs Immature Granulocytes: 0.1 10*3/uL — ABNORMAL HIGH (ref 0.00–0.07)
Basophils Absolute: 0 10*3/uL (ref 0.0–0.1)
Basophils Relative: 0 %
Eosinophils Absolute: 0.1 10*3/uL (ref 0.0–0.5)
Eosinophils Relative: 1 %
HCT: 41.1 % (ref 39.0–52.0)
Hemoglobin: 13.8 g/dL (ref 13.0–17.0)
Immature Granulocytes: 1 %
Lymphocytes Relative: 5 %
Lymphs Abs: 0.8 10*3/uL (ref 0.7–4.0)
MCH: 31.2 pg (ref 26.0–34.0)
MCHC: 33.6 g/dL (ref 30.0–36.0)
MCV: 93 fL (ref 80.0–100.0)
Monocytes Absolute: 1.1 10*3/uL — ABNORMAL HIGH (ref 0.1–1.0)
Monocytes Relative: 7 %
Neutro Abs: 13.8 10*3/uL — ABNORMAL HIGH (ref 1.7–7.7)
Neutrophils Relative %: 86 %
Platelets: 266 10*3/uL (ref 150–400)
RBC: 4.42 MIL/uL (ref 4.22–5.81)
RDW: 12.3 % (ref 11.5–15.5)
WBC: 15.9 10*3/uL — ABNORMAL HIGH (ref 4.0–10.5)
nRBC: 0 % (ref 0.0–0.2)

## 2019-03-04 LAB — BASIC METABOLIC PANEL
Anion gap: 9 (ref 5–15)
BUN: 17 mg/dL (ref 6–20)
CO2: 24 mmol/L (ref 22–32)
Calcium: 8.7 mg/dL — ABNORMAL LOW (ref 8.9–10.3)
Chloride: 107 mmol/L (ref 98–111)
Creatinine, Ser: 1.04 mg/dL (ref 0.61–1.24)
GFR calc Af Amer: >60 mL/min (ref >60)
GFR calc non Af Amer: >60 mL/min (ref >60)
Glucose, Bld: 100 mg/dL — ABNORMAL HIGH (ref 70–99)
Potassium: 3.3 mmol/L — ABNORMAL LOW (ref 3.5–5.1)
Sodium: 140 mmol/L (ref 135–145)

## 2019-03-04 LAB — URINALYSIS, ROUTINE W REFLEX MICROSCOPIC
Bilirubin Urine: NEGATIVE
Glucose, UA: NEGATIVE mg/dL
Hgb urine dipstick: NEGATIVE
Ketones, ur: 20 mg/dL — AB
Leukocytes,Ua: NEGATIVE
Nitrite: NEGATIVE
Protein, ur: 30 mg/dL — AB
Specific Gravity, Urine: 1.02 (ref 1.005–1.030)
pH: 5 (ref 5.0–8.0)

## 2019-03-04 LAB — ETHANOL: Alcohol, Ethyl (B): <10 mg/dL (ref <10)

## 2019-03-04 LAB — MRSA PCR SCREENING: MRSA by PCR: NEGATIVE

## 2019-03-04 LAB — CBG MONITORING, ED: Glucose-Capillary: 84 mg/dL (ref 70–99)

## 2019-03-04 IMAGING — DX PORTABLE CHEST - 1 VIEW
1 series · 1 of 1 positions shown · non-contrast
Comparison: [DATE]

CLINICAL DATA: Shortness of breath

EXAM:
PORTABLE CHEST 1 VIEW

[chest ap]
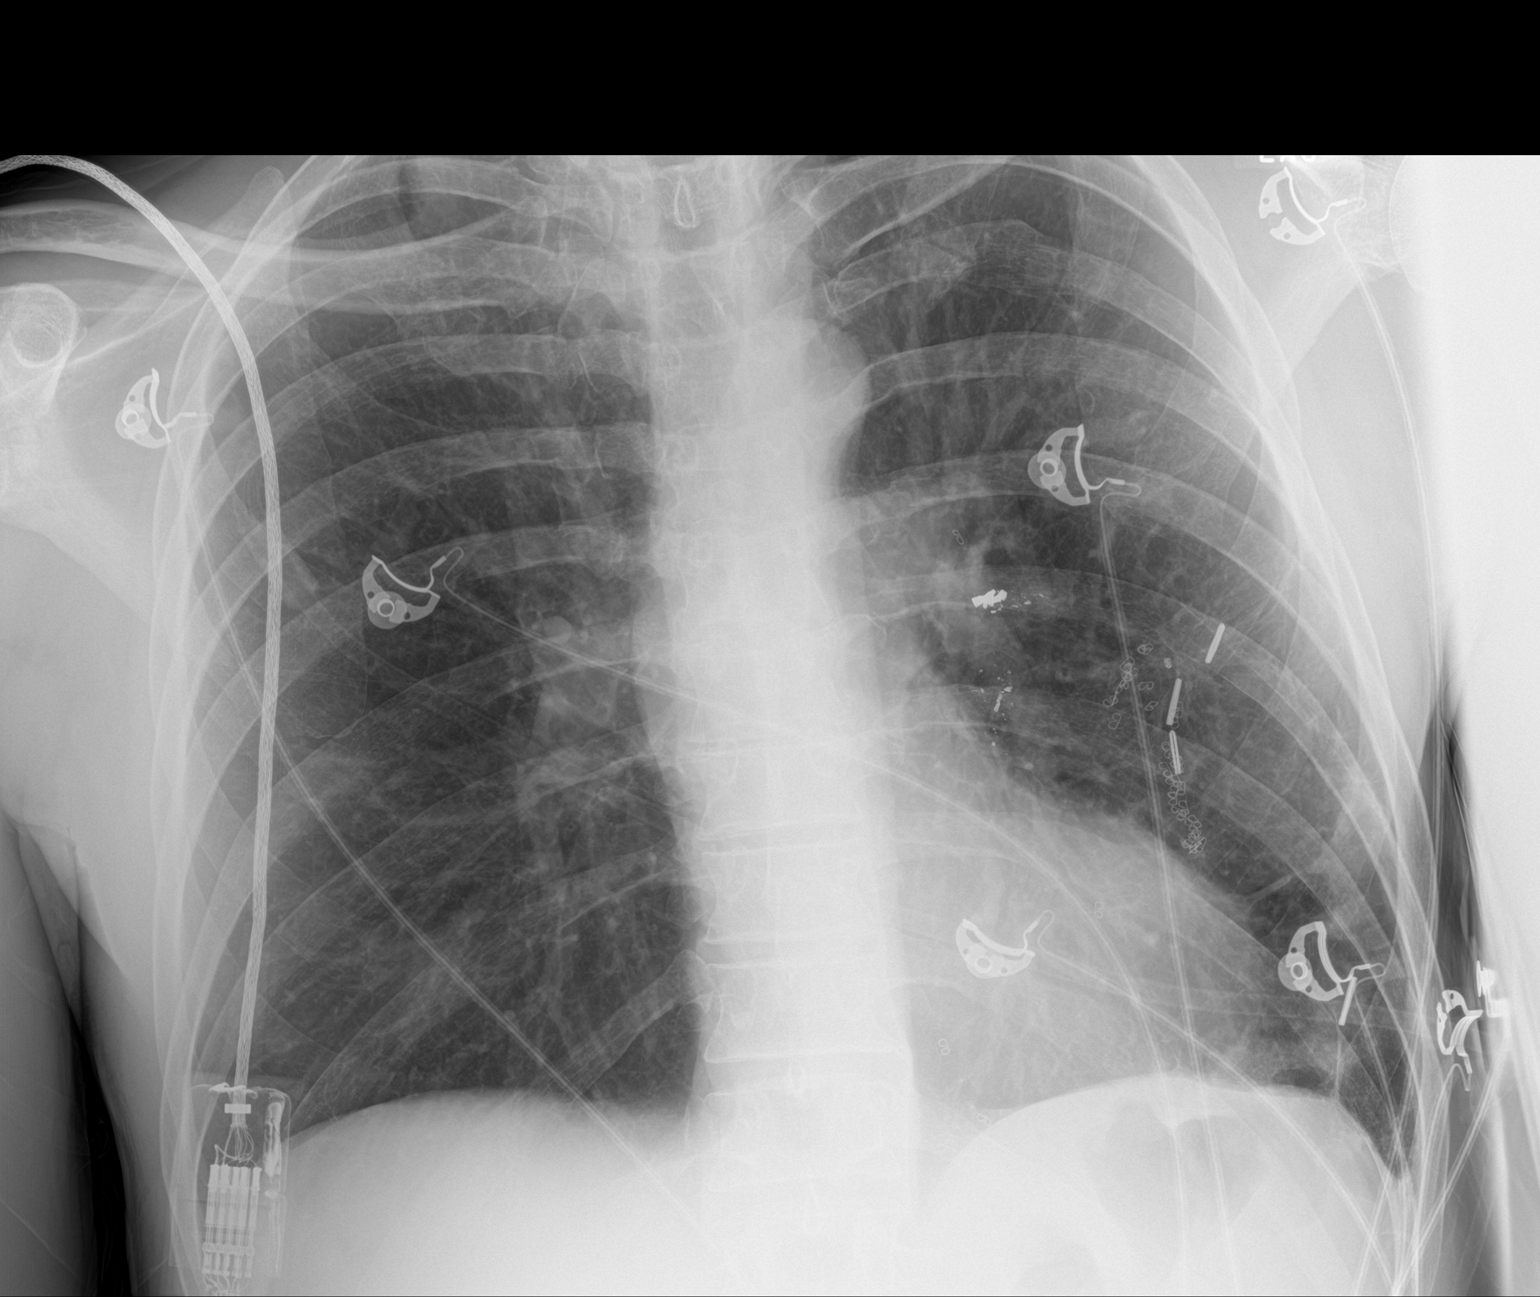

[1 of 1 positions shown; findings below may reference images not displayed]

FINDINGS: Lungs are hyperinflated. There are postsurgical changes of the left
lung. The lungs are clear. No pleural effusion or pneumothorax.
IMPRESSION: COPD without acute airspace disease.
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
## 2019-03-04 MED ORDER — FOLIC ACID 5 MG/ML IJ SOLN
1.0000 mg | Freq: Every day | INTRAMUSCULAR | Status: DC
  Administered 2019-03-04: 12:00:00 1 mg via INTRAVENOUS
  Filled 2019-03-04 (×2): qty 0.2

## 2019-03-04 MED ORDER — ALBUTEROL SULFATE HFA 108 (90 BASE) MCG/ACT IN AERS: 2.0000 | INHALATION_SPRAY | RESPIRATORY_TRACT | Status: DC | PRN

## 2019-03-04 MED ORDER — IPRATROPIUM BROMIDE HFA 17 MCG/ACT IN AERS
2.0000 | INHALATION_SPRAY | Freq: Once | RESPIRATORY_TRACT | Status: AC
  Administered 2019-03-04: 07:00:00 2 via RESPIRATORY_TRACT
  Filled 2019-03-04: qty 12.9

## 2019-03-04 MED ORDER — ACETAMINOPHEN 325 MG PO TABS: 650.0000 mg | ORAL_TABLET | Freq: Four times a day (QID) | ORAL | Status: DC | PRN

## 2019-03-04 MED ORDER — HEPARIN SODIUM (PORCINE) 5000 UNIT/ML IJ SOLN
5000.0000 [IU] | Freq: Three times a day (TID) | INTRAMUSCULAR | Status: DC
  Administered 2019-03-04 – 2019-03-05 (×3): 5000 [IU] via SUBCUTANEOUS
  Filled 2019-03-04 (×3): qty 1

## 2019-03-04 MED ORDER — POTASSIUM CHLORIDE 10 MEQ/100ML IV SOLN
10.0000 meq | INTRAVENOUS | Status: AC
  Administered 2019-03-04 (×4): 10 meq via INTRAVENOUS
  Filled 2019-03-04 (×4): qty 100

## 2019-03-04 MED ORDER — ALBUTEROL SULFATE (2.5 MG/3ML) 0.083% IN NEBU: 2.5000 mg | INHALATION_SOLUTION | RESPIRATORY_TRACT | Status: DC | PRN

## 2019-03-04 MED ORDER — AEROCHAMBER Z-STAT PLUS/MEDIUM MISC: 1.0000 | Freq: Once | Status: DC

## 2019-03-04 MED ORDER — TIOTROPIUM BROMIDE MONOHYDRATE 18 MCG IN CAPS
18.0000 ug | ORAL_CAPSULE | Freq: Every day | RESPIRATORY_TRACT | Status: DC
  Administered 2019-03-04 – 2019-03-05 (×2): 18 ug via RESPIRATORY_TRACT
  Filled 2019-03-04: qty 5

## 2019-03-04 MED ORDER — DEXTROSE-NACL 5-0.45 % IV SOLN
INTRAVENOUS | Status: DC
  Administered 2019-03-04: 22:00:00 50 mL/h via INTRAVENOUS
  Administered 2019-03-04: 10:00:00 via INTRAVENOUS

## 2019-03-04 MED ORDER — FAMOTIDINE 20 MG PO TABS
20.0000 mg | ORAL_TABLET | Freq: Two times a day (BID) | ORAL | Status: DC
  Administered 2019-03-04 – 2019-03-05 (×3): 20 mg via ORAL
  Filled 2019-03-04 (×3): qty 1

## 2019-03-04 MED ORDER — ALBUTEROL SULFATE HFA 108 (90 BASE) MCG/ACT IN AERS
8.0000 | INHALATION_SPRAY | Freq: Once | RESPIRATORY_TRACT | Status: AC
  Administered 2019-03-04: 06:00:00 8 via RESPIRATORY_TRACT
  Filled 2019-03-04: qty 6.7

## 2019-03-04 MED ORDER — NALOXONE HCL 4 MG/10ML IJ SOLN
1.0000 mg/h | INTRAVENOUS | Status: DC
  Administered 2019-03-04 (×3): 1 mg/h via INTRAVENOUS
  Filled 2019-03-04 (×3): qty 10

## 2019-03-04 MED ORDER — ONDANSETRON HCL 4 MG/2ML IJ SOLN
4.0000 mg | Freq: Once | INTRAMUSCULAR | Status: AC
  Administered 2019-03-04: 06:00:00 4 mg via INTRAVENOUS
  Filled 2019-03-04: qty 2

## 2019-03-04 MED ORDER — MOMETASONE FURO-FORMOTEROL FUM 200-5 MCG/ACT IN AERO
2.0000 | INHALATION_SPRAY | Freq: Two times a day (BID) | RESPIRATORY_TRACT | Status: DC
  Administered 2019-03-04 – 2019-03-05 (×3): 2 via RESPIRATORY_TRACT
  Filled 2019-03-04: qty 8.8

## 2019-03-04 MED ORDER — NALOXONE HCL 2 MG/2ML IJ SOSY
1.0000 mg | PREFILLED_SYRINGE | Freq: Once | INTRAMUSCULAR | Status: AC
  Administered 2019-03-04: 06:00:00 1 mg via INTRAVENOUS
  Filled 2019-03-04: qty 2

## 2019-03-04 MED ORDER — THIAMINE HCL 100 MG/ML IJ SOLN
100.0000 mg | Freq: Every day | INTRAMUSCULAR | Status: DC
  Administered 2019-03-04 – 2019-03-05 (×2): 100 mg via INTRAVENOUS
  Filled 2019-03-04 (×2): qty 2

## 2019-03-04 NOTE — ED Notes (Signed)
Report given to [place], for 2W, Room 1238.

## 2019-03-04 NOTE — Plan of Care (Signed)
  Problem: Education: Goal: Knowledge of General Education information will improve Description: Including pain rating scale, medication(s)/side effects and non-pharmacologic comfort measures Outcome: Progressing   Problem: Safety: Goal: Ability to remain free from injury will improve Outcome: Progressing   Problem: Skin Integrity: Goal: Risk for impaired skin integrity will decrease Outcome: Progressing   
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
## 2019-03-04 NOTE — ED Notes (Signed)
Bed: HQ60 Expected date:  Expected time:  Means of arrival:  Comments: 51s respiratory complaints/combative

## 2019-03-04 NOTE — ED Notes (Signed)
Called respiratory x2 for ABG in recess A. No response.

## 2019-03-04 NOTE — ED Triage Notes (Signed)
Per EMS, pt with resp distress at home. Upon arrival, EMS stated that pt had seizure-like activity anda then became combative. Haldol 5mg  IM, Solumedrol 125mg  IV and 0.3 Epi SQ given PTA by EMS.

## 2019-03-04 NOTE — Progress Notes (Signed)
Pt. Having alarms going off of monitor, stating- ST elevation >2. MD made aware. 12 Lead EKG completed, given to provider.

## 2019-03-04 NOTE — ED Notes (Signed)
Pt given 1 mg narcan IV. AOx4. Agitation, tremors. Verbalizes he can breathe.

## 2019-03-04 NOTE — Progress Notes (Signed)
RT in this Pt's room sticking Pt for arterial blood gas when RN was calling.
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
## 2019-03-04 NOTE — ED Notes (Signed)
Called pharmacy to request ipratropium to be sent to ED.

## 2019-03-04 NOTE — Progress Notes (Signed)
Narcan paused at 1438 today. He is awake and oriented, watching TV. However, pt. Is currently hypersomnolent and hard to arouse. Narcan restarted.

## 2019-03-04 NOTE — Progress Notes (Signed)
CSW received consult for assistance with advanced directives- at this time chaplain services are not completing advanced directives. Patient is able to follow up with community services to complete advance directives- etc. IRC, bank.   Kingsley Spittle, LCSW [facility]  [phone]

## 2019-03-04 NOTE — ED Notes (Signed)
Hospitalist at bedside 

## 2019-03-04 NOTE — ED Notes (Signed)
Made provider aware of 0.04 troponin

## 2019-03-04 NOTE — ED Notes (Signed)
Patient transported back from Ct via stretcher.
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
## 2019-03-05 DIAGNOSIS — T40601A Poisoning by unspecified narcotics, accidental (unintentional), initial encounter: Secondary | ICD-10-CM

## 2019-03-05 DIAGNOSIS — F1910 Other psychoactive substance abuse, uncomplicated: Secondary | ICD-10-CM

## 2019-03-05 DIAGNOSIS — F319 Bipolar disorder, unspecified: Secondary | ICD-10-CM

## 2019-03-05 DIAGNOSIS — F19239 Other psychoactive substance dependence with withdrawal, unspecified: Secondary | ICD-10-CM

## 2019-03-05 DIAGNOSIS — G251 Drug-induced tremor: Secondary | ICD-10-CM

## 2019-03-05 DIAGNOSIS — N289 Disorder of kidney and ureter, unspecified: Secondary | ICD-10-CM

## 2019-03-05 DIAGNOSIS — F1414 Cocaine abuse with cocaine-induced mood disorder: Secondary | ICD-10-CM

## 2019-03-05 DIAGNOSIS — T40601S Poisoning by unspecified narcotics, accidental (unintentional), sequela: Secondary | ICD-10-CM

## 2019-03-05 DIAGNOSIS — J449 Chronic obstructive pulmonary disease, unspecified: Secondary | ICD-10-CM

## 2019-03-05 DIAGNOSIS — F19939 Other psychoactive substance use, unspecified with withdrawal, unspecified: Secondary | ICD-10-CM

## 2019-03-05 DIAGNOSIS — I4891 Unspecified atrial fibrillation: Secondary | ICD-10-CM

## 2019-03-05 HISTORY — DX: Drug-induced tremor: G25.1

## 2019-03-05 HISTORY — DX: Other psychoactive substance dependence with withdrawal, unspecified: F19.239

## 2019-03-05 HISTORY — DX: Poisoning by unspecified narcotics, accidental (unintentional), initial encounter: T40.601A

## 2019-03-05 HISTORY — DX: Other psychoactive substance use, unspecified with withdrawal, unspecified: F19.939

## 2019-03-05 LAB — CBC
HCT: 41.4 % (ref 39.0–52.0)
Hemoglobin: 13.6 g/dL (ref 13.0–17.0)
MCH: 31.1 pg (ref 26.0–34.0)
MCHC: 32.9 g/dL (ref 30.0–36.0)
MCV: 94.5 fL (ref 80.0–100.0)
Platelets: 260 10*3/uL (ref 150–400)
RBC: 4.38 MIL/uL (ref 4.22–5.81)
RDW: 12.3 % (ref 11.5–15.5)
WBC: 10.4 10*3/uL (ref 4.0–10.5)
nRBC: 0 % (ref 0.0–0.2)

## 2019-03-05 LAB — BASIC METABOLIC PANEL
Anion gap: 7 (ref 5–15)
BUN: 15 mg/dL (ref 6–20)
CO2: 25 mmol/L (ref 22–32)
Calcium: 8.7 mg/dL — ABNORMAL LOW (ref 8.9–10.3)
Chloride: 103 mmol/L (ref 98–111)
Creatinine, Ser: 1.05 mg/dL (ref 0.61–1.24)
GFR calc Af Amer: >60 mL/min (ref >60)
GFR calc non Af Amer: >60 mL/min (ref >60)
Glucose, Bld: 101 mg/dL — ABNORMAL HIGH (ref 70–99)
Potassium: 3.7 mmol/L (ref 3.5–5.1)
Sodium: 135 mmol/L (ref 135–145)

## 2019-03-05 LAB — TROPONIN I: Troponin I: 0.03 ng/mL (ref <0.03)

## 2019-03-05 LAB — PHOSPHORUS: Phosphorus: 2.9 mg/dL (ref 2.5–4.6)

## 2019-03-05 LAB — MAGNESIUM: Magnesium: 2.2 mg/dL (ref 1.7–2.4)
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
## 2019-03-05 NOTE — Discharge Instructions (Signed)
Opioid Overdose Opioids are substances that relieve pain by binding to pain receptors in your brain and spinal cord. Opioids include illegal drugs, such as heroin, as well as prescription pain medicines.An opioid overdose happens when you take too much of an opioid substance. This can happen with any type of opioid, including:  Heroin.  Morphine.  Codeine.  Methadone.  Oxycodone.  Hydrocodone.  Fentanyl.  Hydromorphone.  Buprenorphine. The effects of an overdose can be mild, dangerous, or even deadly. Opioid overdose is a medical emergency. What are the causes? This condition may be caused by:  Taking too much of an opioid by accident.  Taking too much of an opioid on purpose.  An error made by a health care provider who prescribes a medicine.  An error made by the pharmacist who fills the prescription order.  Using more than one substance that contains opioids at the same time.  Mixing an opioid with a substance that affects your heart, breathing, or blood pressure. These include alcohol, tranquilizers, sleeping pills, illegal drugs, and some over-the-counter medicines. What increases the risk? This condition is more likely in:  Children. They may be attracted to colorful pills. Because of a child's small size, even a small amount of a drug can be dangerous.  Elderly people. They may be taking many different drugs. Elderly people may have difficulty reading labels or remembering when they last took their medicine.  People who take an opioid on a long-term basis.  People who use: ? Illegal drugs. ? Other substances, including alcohol, while using an opioid.  People who have: ? A history of drug or alcohol abuse. ? Certain mental health conditions.  People who take opioids that are not prescribed for them. What are the signs or symptoms? Symptoms of this condition depend on the type of opioid and the amount that was taken. Common symptoms include:  Sleepiness  or difficulty waking from sleep.  Confusion.  Slurred speech.  Slowed breathing and a slow pulse.  Nausea and vomiting.  Abnormally small pupils. Signs and symptoms that require emergency treatment include:  Cold, clammy, and pale skin.  Blue lips and fingernails.  Vomiting.  Gurgling sounds in the throat.  A pulse that is very slow or difficult to detect.  Breathing that is very slow, noisy, or difficult to detect.  Limp body.  Inability to respond to speech or be awakened from sleep (stupor). How is this diagnosed? This condition is diagnosed based on your symptoms. It is important to tell your health care provider:  All of the opioidsthat you took.  When you took the opioids.  Whether you were drinking alcohol or using other substances. Your health care provider will do a physical exam. This exam may include:  Checking and monitoring your heart rate and rhythm, your breathing rate and depth, your temperature, and your blood pressure (vital signs).  Checking for abnormally small pupils.  Measuring oxygen levels in your blood. You may also have blood tests or urine tests. How is this treated? Supporting your vital signs and your breathing is the first step in treating an opioid overdose. Treatment may also include:  Giving fluids and minerals (electrolytes) through an IV tube.  Inserting a breathing tube (endotracheal tube) in your airway to help you breathe.  Giving oxygen.  Passing a tube through your nose and into your stomach (NG tube, or nasogastric tube) to wash out your stomach.  Giving medicines that: ? Increase your blood pressure. ? Absorb any opioid that  is in your digestive system. ? Reverse the effects of the opioid (naloxone).  Ongoing counseling and mental health support if you intentionally overdosed or used an illegal drug. Follow these instructions at home:   Take over-the-counter and prescription medicines only as told by your  health care provider. Always ask your health care provider about possible side effects and interactions of any new medicine that you start taking.  Keep a list of all of the medicines that you take, including over-the-counter medicines. Bring this list with you to all of your medical visits.  Drink enough fluid to keep your urine clear or pale yellow.  Keep all follow-up visits as told by your health care provider. This is important. How is this prevented?  Get help if you are struggling with: ? Alcohol or drug use. ? Depression or another mental health problem.  Keep the phone number of your local poison control center near your phone or on your cell phone.  Store all medicines in safety containers that are out of the reach of children.  Read the drug inserts that come with your medicines.  Do not drink alcohol when taking opioids.  Do not use illegal drugs.  Do not take opioid medicines that are not prescribed for you. Contact a health care provider if:  Your symptoms return.  You develop new symptoms or side effects when you are taking medicines. Get help right away if:  You think that you or someone else may have taken too much of an opioid. The hotline of the [facility] is 404-029-1624.  You or someone else is having symptoms of an opioid overdose.  You have serious thoughts about hurting yourself or others.  You have: ? Chest pain. ? Difficulty breathing. ? A loss of consciousness. Opioid overdose is an emergency. Do not wait to see if the symptoms will go away. Get medical help right away. Call your local emergency services (911 in the [place] Do not drive yourself to the hospital. This information is not intended to replace advice given to you by your health care provider. Make sure you discuss any questions you have with your health care provider. Document Released: 12/06/2004 Document Revised: 05/16/2017 Document Reviewed: 04/14/2015 Elsevier  Interactive Patient Education  2019 [employer].

## 2019-03-05 NOTE — Discharge Summary (Signed)
Physician Discharge Summary  Patient ID: Kenneth Mcdowell MRN: [id number] DOB/AGE: 1966/10/27 53 y.o.  Admit date: 03/04/2019 Discharge date: 03/05/2019  Admission Diagnoses: Acute encephalopathy due to unintentional polysubstance overdose  Discharge Diagnoses:  Principal Problem:   Acute encephalopathy Active Problems:   Cocaine abuse with cocaine-induced mood disorder (HCC)   Polysubstance abuse (HCC)   Opiate overdose (HCC)   Acute renal insufficiency   Tremor due to drug withdrawal ([place])   COPD with asthma ([place])   Bipolar disorder ([place])   Atrial fibrillation with RVR ([place])   Discharged Condition: stable  Hospital Course:  53 year old man with polysubstance abuse (tobacco, cocaine, narcotics), bipolar disorder, atrial fibrillation, poorly controlled COPD.  No history of seizures.  Brought to the emergency department 4/22 with dyspnea and altered mental status.  History is derived from chart notes as patient has no memory of events.  He was seen in the ED 4/18 with increased dyspnea for 4 to 5 days, no cough, no fever, no sick contacts.  He had run out of his bronchodilators which seem to correlate with his dyspnea.  He received corticosteroids, albuterol.  He called EMS for dyspnea early 4/22, was found on the floor shaking.  There was some suspicion for possible seizure activity.  He received Solu-Medrol, subcutaneous epinephrine.  He was combative and then received 5 mg Haldol in the field.  On evaluation in the ED he was hypersomnolent, poorly responsive.  Head CT 4/22 unremarkable.  UDS positive for narcotics, cocaine.  He received serial doses of Narcan with significant but temporary improvement in his mental status.  Narcan infusion was therefore initiated.  Narcan able to be weaned to off completely by 0400 on 4/23. Renal function normalized and there was no evidence of encephalopathy or tremor.  Patient awake, alert without tremor.  Denies any discomfort or dyspnea.  He was  asking at home.  He states that he will follow-up at [place] and [facility].  Discussed the importance of getting his inhaled medications reliably, avoiding intoxicating substances.  Also advised him that he can seek rehab or psych referral if he wanted to do so.  He is in denial regarding his substance abuse.  Able to discharge to home on 4/23 in improved condition.  Consults: None  Significant Diagnostic Studies:   ECG normal x2 on 4/22  CXR 4/22 >> normal  CBC Latest Ref Rng & Units 03/05/2019 03/04/2019 02/28/2019  WBC 4.0 - 10.5 K/uL 10.4 15.9(H) 6.7  Hemoglobin 13.0 - 17.0 g/dL 13.6 13.8 14.0  Hematocrit 39.0 - 52.0 % 41.4 41.1 42.0  Platelets 150 - 400 K/uL 260 266 234   BMP Latest Ref Rng & Units 03/05/2019 03/04/2019 02/28/2019  Glucose 70 - 99 mg/dL 101(H) 100(H) 115(H)  BUN 6 - 20 mg/dL 15 17 10   Creatinine 0.61 - 1.24 mg/dL 1.05 1.04 1.39(H)  Sodium 135 - 145 mmol/L 135 140 136  Potassium 3.5 - 5.1 mmol/L 3.7 3.3(L) 6.2(H)  Chloride 98 - 111 mmol/L 103 107 104  CO2 22 - 32 mmol/L 25 24 22   Calcium 8.9 - 10.3 mg/dL 8.7(L) 8.7(L) 9.0    Treatments: IV hydration, narcan infusion, reinstitution of his bronchodilator regimen  Discharge Exam: Blood pressure (!) 134/98, pulse 81, temperature 97.6 F (36.4 C), temperature source Oral, resp. rate 17, height 6\' 4"  (1.93 m), weight 68.3 kg, SpO2 97 %.  General: Thin chronically ill-appearing man, much more awake, no distress HENT: no UA noise, strong voice Lungs:distant, clear, no  wheeze Cardiovascular: Regular, no M Abdomen: Soft, nondistended with positive bowel sounds Extremities: no edema Neuro: wide awake, interacting appropriately, PERRL.   Skin: Multiple tattoos, no rash  Disposition:  To Home  Allergies as of 03/05/2019   No Known Allergies     Medication List    STOP taking these medications   predniSONE 20 MG tablet Commonly known as:  DELTASONE     TAKE these medications   acetaminophen  325 MG tablet Commonly known as:  TYLENOL Take 2 tablets (650 mg total) by mouth every 6 (six) hours as needed for mild pain (or Fever >/= 101).   albuterol (2.5 MG/3ML) 0.083% nebulizer solution Commonly known as:  PROVENTIL Take 3 mLs (2.5 mg total) by nebulization every 6 (six) hours as needed for wheezing or shortness of breath.   famotidine 20 MG tablet Commonly known as:  Pepcid Take 2 tablets (40 mg total) by mouth at bedtime.   mometasone-formoterol 200-5 MCG/ACT Aero Commonly known as:  Dulera Inhale 2 puffs into the lungs 2 (two) times daily.   tiotropium 18 MCG inhalation capsule Commonly known as:  SPIRIVA Place 18 mcg into inhaler and inhale daily.      Follow-up Information    [facility]. Schedule an appointment as soon as possible for a visit in 2 week(s).   Contact information: [address] [place] [address] [phone]          Signed:  Baltazar Apo, MD, PhD 03/05/2019, 9:15 AM [facility] [phone] or if no answer [phone]

## 2019-03-05 NOTE — Progress Notes (Signed)
Patient discharge teaching given, including activity, diet, follow-up appoints, and medications. Patient verbalized understanding of all discharge instructions. IV access was d/c'd. Vitals are stable. Skin is intact except as charted in most recent assessments. Pt to be escorted out by RN, to be driven home by family.  Extensive teaching done on opioid overdose.

## 2019-03-06 ENCOUNTER — Telehealth: Payer: Self-pay

## 2019-03-06 NOTE — Telephone Encounter (Signed)
Transition Care Management Follow-up Telephone Call Date of discharge and from where: 03/05/2019, [facility]   Attempted to contact patient # [phone] and the message stated the voicemail has not been set up yet.

## 2019-03-09 ENCOUNTER — Telehealth: Payer: Self-pay

## 2019-03-09 NOTE — Telephone Encounter (Signed)
Transition Care Management Follow-up Telephone Call Date of discharge and from where: 03/05/2019, [facility].  Call placed to # [phone] and the message stated that the person has a voicemail that is not set up.  The phone number for [facility] is on the hospital discharge AVS

## 2019-04-28 ENCOUNTER — Emergency Department (HOSPITAL_COMMUNITY)
Admission: EM | Admit: 2019-04-28 | Discharge: 2019-04-28 | Disposition: A | Discharge status: Home / Self Care | Payer: Self-pay | Attending: Emergency Medicine | Admitting: Emergency Medicine

## 2019-04-28 ENCOUNTER — Encounter (HOSPITAL_COMMUNITY): Payer: Self-pay | Admitting: Emergency Medicine

## 2019-04-28 DIAGNOSIS — J441 Chronic obstructive pulmonary disease with (acute) exacerbation: Secondary | ICD-10-CM | POA: Insufficient documentation

## 2019-04-28 DIAGNOSIS — R0682 Tachypnea, not elsewhere classified: Secondary | ICD-10-CM | POA: Insufficient documentation

## 2019-04-28 DIAGNOSIS — F1210 Cannabis abuse, uncomplicated: Secondary | ICD-10-CM | POA: Insufficient documentation

## 2019-04-28 DIAGNOSIS — Z9981 Dependence on supplemental oxygen: Secondary | ICD-10-CM | POA: Insufficient documentation

## 2019-04-28 DIAGNOSIS — F17228 Nicotine dependence, chewing tobacco, with other nicotine-induced disorders: Secondary | ICD-10-CM | POA: Insufficient documentation

## 2019-04-28 DIAGNOSIS — F1410 Cocaine abuse, uncomplicated: Secondary | ICD-10-CM | POA: Insufficient documentation

## 2019-04-28 MED ORDER — ALBUTEROL SULFATE (2.5 MG/3ML) 0.083% IN NEBU: 2.5000 mg | INHALATION_SOLUTION | Freq: Four times a day (QID) | RESPIRATORY_TRACT | 12 refills | Status: DC | PRN

## 2019-04-28 MED ORDER — PREDNISONE 20 MG PO TABS: 40.0000 mg | ORAL_TABLET | Freq: Every day | ORAL | 0 refills | Status: DC

## 2019-04-28 MED ORDER — IPRATROPIUM BROMIDE HFA 17 MCG/ACT IN AERS
4.0000 | INHALATION_SPRAY | Freq: Once | RESPIRATORY_TRACT | Status: AC
  Administered 2019-04-28: 4 via RESPIRATORY_TRACT
  Filled 2019-04-28: qty 12.9

## 2019-04-28 MED ORDER — ALBUTEROL SULFATE HFA 108 (90 BASE) MCG/ACT IN AERS
8.0000 | INHALATION_SPRAY | RESPIRATORY_TRACT | Status: DC | PRN
  Administered 2019-04-28: 8 via RESPIRATORY_TRACT
  Filled 2019-04-28: qty 6.7

## 2019-04-28 NOTE — ED Triage Notes (Signed)
Per EMS: Pt called out for respiratory distress 2 x.  First time pt was satting 94% RA.  Pt refused everything then.  The second time he called out pt was distress and was satting 59% RA. Pt given 0.3 epi IM, 125mg   solu-medrol, and mag.  18 gauge LAC.
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
## 2019-04-28 NOTE — ED Notes (Signed)
Pt walked without any assitance. 02 stats stayed at 94% entire walk
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
## 2019-06-04 ENCOUNTER — Emergency Department (HOSPITAL_COMMUNITY): Payer: Self-pay

## 2019-06-04 ENCOUNTER — Encounter (HOSPITAL_COMMUNITY): Payer: Self-pay | Admitting: *Deleted

## 2019-06-04 ENCOUNTER — Other Ambulatory Visit: Payer: Self-pay

## 2019-06-04 ENCOUNTER — Inpatient Hospital Stay (HOSPITAL_COMMUNITY)
Admission: EM | Admit: 2019-06-04 | Discharge: 2019-06-06 | DRG: 202 | Disposition: A | Discharge status: Home / Self Care | Payer: Self-pay | Attending: Family Medicine | Admitting: Family Medicine

## 2019-06-04 DIAGNOSIS — F1410 Cocaine abuse, uncomplicated: Secondary | ICD-10-CM | POA: Diagnosis present

## 2019-06-04 DIAGNOSIS — Z20828 Contact with and (suspected) exposure to other viral communicable diseases: Secondary | ICD-10-CM | POA: Diagnosis present

## 2019-06-04 DIAGNOSIS — Z825 Family history of asthma and other chronic lower respiratory diseases: Secondary | ICD-10-CM

## 2019-06-04 DIAGNOSIS — F1910 Other psychoactive substance abuse, uncomplicated: Secondary | ICD-10-CM | POA: Diagnosis present

## 2019-06-04 DIAGNOSIS — J439 Emphysema, unspecified: Secondary | ICD-10-CM

## 2019-06-04 DIAGNOSIS — Z833 Family history of diabetes mellitus: Secondary | ICD-10-CM

## 2019-06-04 DIAGNOSIS — Z9114 Patient's other noncompliance with medication regimen: Secondary | ICD-10-CM

## 2019-06-04 DIAGNOSIS — F909 Attention-deficit hyperactivity disorder, unspecified type: Secondary | ICD-10-CM | POA: Diagnosis present

## 2019-06-04 DIAGNOSIS — F319 Bipolar disorder, unspecified: Secondary | ICD-10-CM | POA: Diagnosis present

## 2019-06-04 DIAGNOSIS — F17290 Nicotine dependence, other tobacco product, uncomplicated: Secondary | ICD-10-CM | POA: Diagnosis present

## 2019-06-04 DIAGNOSIS — F1110 Opioid abuse, uncomplicated: Secondary | ICD-10-CM | POA: Diagnosis present

## 2019-06-04 DIAGNOSIS — J449 Chronic obstructive pulmonary disease, unspecified: Secondary | ICD-10-CM | POA: Diagnosis present

## 2019-06-04 DIAGNOSIS — R0902 Hypoxemia: Secondary | ICD-10-CM | POA: Diagnosis present

## 2019-06-04 DIAGNOSIS — R0603 Acute respiratory distress: Secondary | ICD-10-CM

## 2019-06-04 DIAGNOSIS — F410 Panic disorder [episodic paroxysmal anxiety] without agoraphobia: Secondary | ICD-10-CM

## 2019-06-04 DIAGNOSIS — J4541 Moderate persistent asthma with (acute) exacerbation: Secondary | ICD-10-CM

## 2019-06-04 DIAGNOSIS — J45901 Unspecified asthma with (acute) exacerbation: Principal | ICD-10-CM | POA: Diagnosis present

## 2019-06-04 DIAGNOSIS — J441 Chronic obstructive pulmonary disease with (acute) exacerbation: Secondary | ICD-10-CM | POA: Diagnosis present

## 2019-06-04 LAB — POCT I-STAT 7, (LYTES, BLD GAS, ICA,H+H)
Acid-base deficit: 3 mmol/L — ABNORMAL HIGH (ref 0.0–2.0)
Bicarbonate: 22.5 mmol/L (ref 20.0–28.0)
Calcium, Ion: 1.2 mmol/L (ref 1.15–1.40)
HCT: 41 % (ref 39.0–52.0)
Hemoglobin: 13.9 g/dL (ref 13.0–17.0)
O2 Saturation: 99 %
Patient temperature: 98.3
Potassium: 4 mmol/L (ref 3.5–5.1)
Sodium: 134 mmol/L — ABNORMAL LOW (ref 135–145)
TCO2: 24 mmol/L (ref 22–32)
pCO2 arterial: 41.7 mmHg (ref 32.0–48.0)
pH, Arterial: 7.339 — ABNORMAL LOW (ref 7.350–7.450)
pO2, Arterial: 124 mmHg — ABNORMAL HIGH (ref 83.0–108.0)

## 2019-06-04 LAB — CBC WITH DIFFERENTIAL/PLATELET
Abs Immature Granulocytes: 0.01 10*3/uL (ref 0.00–0.07)
Basophils Absolute: 0 10*3/uL (ref 0.0–0.1)
Basophils Relative: 0 %
Eosinophils Absolute: 0.5 10*3/uL (ref 0.0–0.5)
Eosinophils Relative: 7 %
HCT: 41 % (ref 39.0–52.0)
Hemoglobin: 13.3 g/dL (ref 13.0–17.0)
Immature Granulocytes: 0 %
Lymphocytes Relative: 14 %
Lymphs Abs: 1 10*3/uL (ref 0.7–4.0)
MCH: 29.7 pg (ref 26.0–34.0)
MCHC: 32.4 g/dL (ref 30.0–36.0)
MCV: 91.5 fL (ref 80.0–100.0)
Monocytes Absolute: 0.8 10*3/uL (ref 0.1–1.0)
Monocytes Relative: 11 %
Neutro Abs: 5 10*3/uL (ref 1.7–7.7)
Neutrophils Relative %: 68 %
Platelets: 291 10*3/uL (ref 150–400)
RBC: 4.48 MIL/uL (ref 4.22–5.81)
RDW: 13.2 % (ref 11.5–15.5)
WBC: 7.5 10*3/uL (ref 4.0–10.5)
nRBC: 0 % (ref 0.0–0.2)

## 2019-06-04 LAB — BASIC METABOLIC PANEL
Anion gap: 10 (ref 5–15)
BUN: 10 mg/dL (ref 6–20)
CO2: 25 mmol/L (ref 22–32)
Calcium: 9 mg/dL (ref 8.9–10.3)
Chloride: 104 mmol/L (ref 98–111)
Creatinine, Ser: 0.93 mg/dL (ref 0.61–1.24)
GFR calc Af Amer: >60 mL/min (ref >60)
GFR calc non Af Amer: >60 mL/min (ref >60)
Glucose, Bld: 100 mg/dL — ABNORMAL HIGH (ref 70–99)
Potassium: 4.6 mmol/L (ref 3.5–5.1)
Sodium: 139 mmol/L (ref 135–145)

## 2019-06-04 LAB — SARS CORONAVIRUS 2 BY RT PCR (HOSPITAL ORDER, PERFORMED IN ~~LOC~~ HOSPITAL LAB): SARS Coronavirus 2: NEGATIVE

## 2019-06-04 IMAGING — DX PORTABLE CHEST - 1 VIEW
1 series · 1 of 1 positions shown · non-contrast
Comparison: [DATE] and earlier

CLINICAL DATA: Decrease in o2 sats resp distress. Sudden alt mental
status. DR did not want another view to include all of apices
Compare to film taken earlier today

EXAM:
PORTABLE CHEST 1 VIEW

[chest ap]
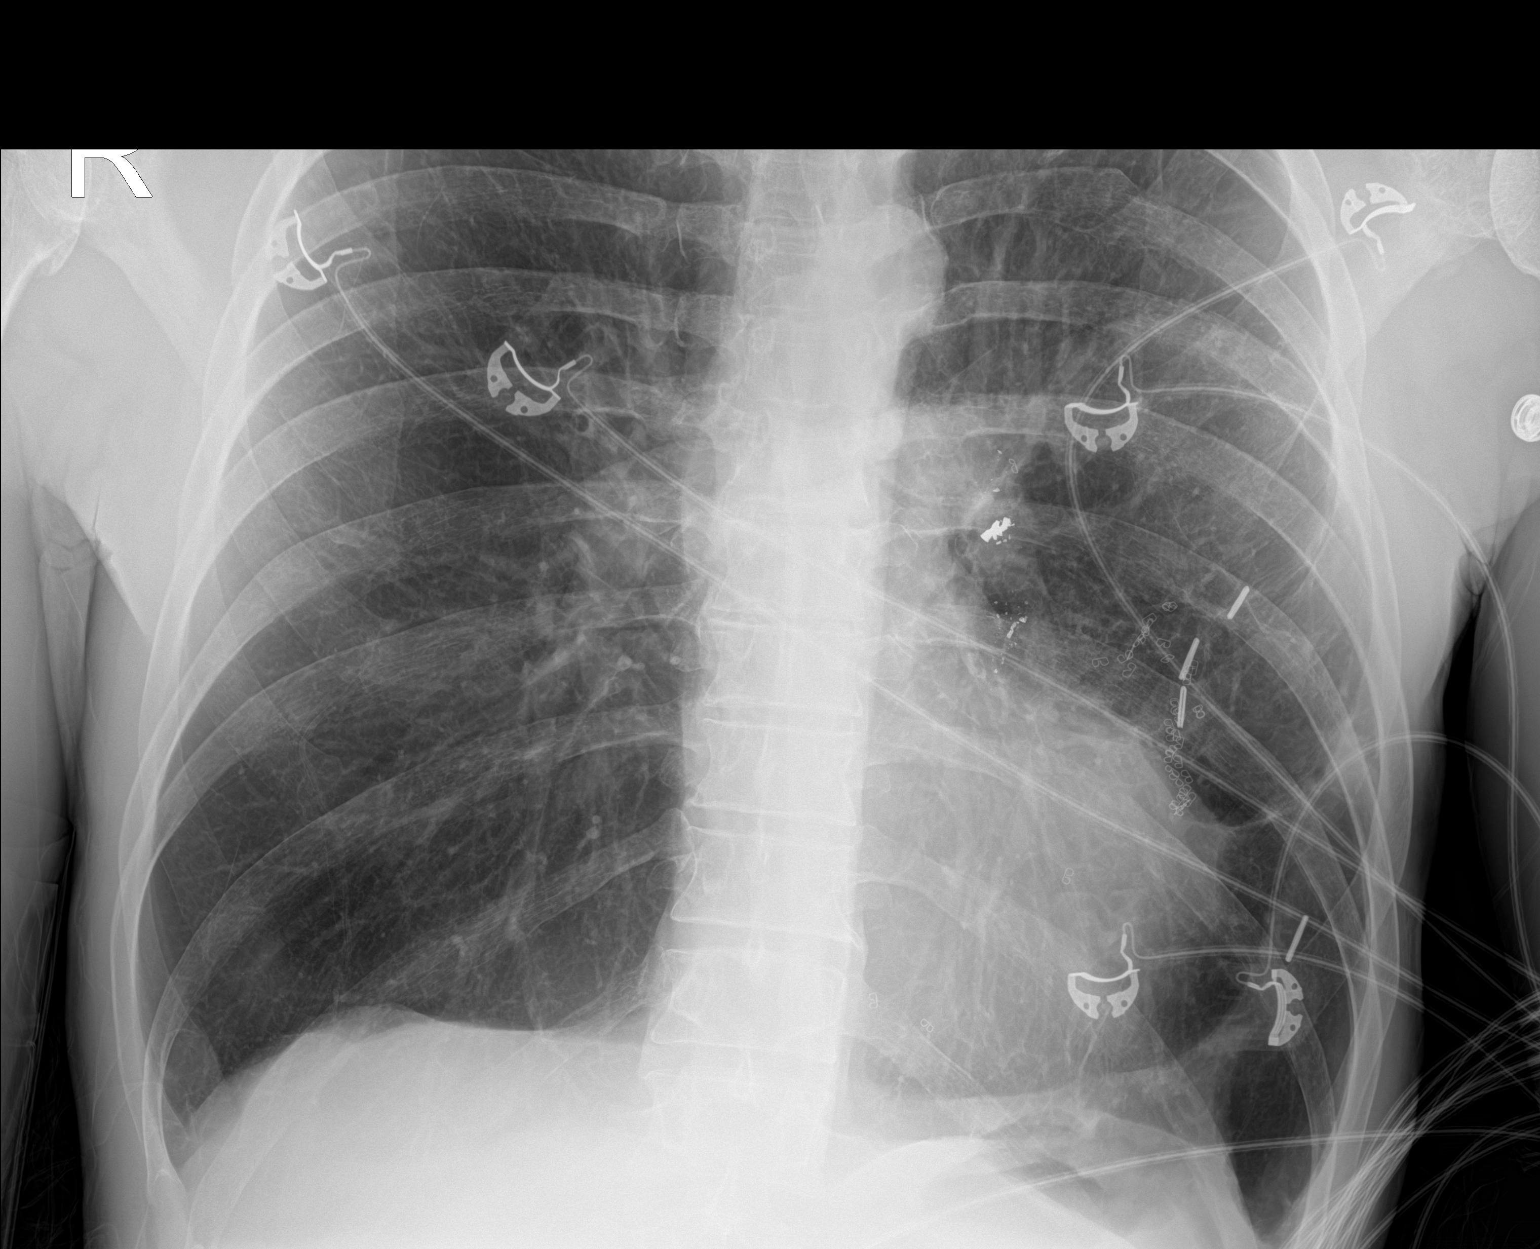

[1 of 1 positions shown; findings below may reference images not displayed]

FINDINGS: Lungs are hyperinflated. Postoperative changes are identified in the
LEFT hemithorax. There is perihilar peribronchial thickening. Heart
size is normal. There are no new consolidations. No pleural
effusions.
IMPRESSION: 1. Postoperative changes.
2. Bronchitic changes.
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
## 2019-06-04 MED ORDER — KETAMINE HCL 50 MG/5ML IJ SOSY
PREFILLED_SYRINGE | INTRAMUSCULAR | Status: AC
  Administered 2019-06-04: 10:00:00 22 mg via INTRAVENOUS
  Filled 2019-06-04: qty 5

## 2019-06-04 MED ORDER — ADULT MULTIVITAMIN W/MINERALS CH
1.0000 | ORAL_TABLET | Freq: Every day | ORAL | Status: DC
  Administered 2019-06-04 – 2019-06-06 (×3): 1 via ORAL
  Filled 2019-06-04 (×3): qty 1

## 2019-06-04 MED ORDER — ACETAMINOPHEN 325 MG PO TABS: 650.0000 mg | ORAL_TABLET | Freq: Four times a day (QID) | ORAL | Status: DC | PRN

## 2019-06-04 MED ORDER — MOMETASONE FURO-FORMOTEROL FUM 200-5 MCG/ACT IN AERO
2.0000 | INHALATION_SPRAY | Freq: Two times a day (BID) | RESPIRATORY_TRACT | Status: DC
  Administered 2019-06-05 – 2019-06-06 (×3): 2 via RESPIRATORY_TRACT
  Filled 2019-06-04 (×2): qty 8.8

## 2019-06-04 MED ORDER — ALBUTEROL (5 MG/ML) CONTINUOUS INHALATION SOLN
10.0000 mg/h | INHALATION_SOLUTION | Freq: Once | RESPIRATORY_TRACT | Status: AC
  Administered 2019-06-04: 09:00:00 10 mg/h via RESPIRATORY_TRACT
  Filled 2019-06-04: qty 20

## 2019-06-04 MED ORDER — ENOXAPARIN SODIUM 40 MG/0.4ML ~~LOC~~ SOLN
40.0000 mg | SUBCUTANEOUS | Status: DC
  Filled 2019-06-04: qty 0.4

## 2019-06-04 MED ORDER — LACTATED RINGERS IV SOLN
INTRAVENOUS | Status: DC
  Administered 2019-06-04 – 2019-06-05 (×2): via INTRAVENOUS

## 2019-06-04 MED ORDER — ONDANSETRON HCL 4 MG PO TABS: 4.0000 mg | ORAL_TABLET | Freq: Four times a day (QID) | ORAL | Status: DC | PRN

## 2019-06-04 MED ORDER — MIDAZOLAM HCL 2 MG/2ML IJ SOLN
2.0000 mg | Freq: Once | INTRAMUSCULAR | Status: AC
  Administered 2019-06-04: 09:00:00 2 mg via INTRAVENOUS
  Filled 2019-06-04: qty 2

## 2019-06-04 MED ORDER — CLONIDINE HCL 0.1 MG PO TABS
0.1000 mg | ORAL_TABLET | Freq: Three times a day (TID) | ORAL | Status: DC | PRN
  Administered 2019-06-06: 0.1 mg via ORAL
  Filled 2019-06-04 (×2): qty 1

## 2019-06-04 MED ORDER — ALBUTEROL SULFATE HFA 108 (90 BASE) MCG/ACT IN AERS
8.0000 | INHALATION_SPRAY | Freq: Once | RESPIRATORY_TRACT | Status: AC
  Administered 2019-06-04: 8 via RESPIRATORY_TRACT
  Filled 2019-06-04: qty 6.7

## 2019-06-04 MED ORDER — MOMETASONE FURO-FORMOTEROL FUM 200-5 MCG/ACT IN AERO: 2.0000 | INHALATION_SPRAY | Freq: Two times a day (BID) | RESPIRATORY_TRACT | Status: DC

## 2019-06-04 MED ORDER — ACETAMINOPHEN 650 MG RE SUPP: 650.0000 mg | Freq: Four times a day (QID) | RECTAL | Status: DC | PRN

## 2019-06-04 MED ORDER — KETAMINE HCL 50 MG/5ML IJ SOSY
0.3000 mg/kg | PREFILLED_SYRINGE | Freq: Once | INTRAMUSCULAR | Status: AC
  Administered 2019-06-04: 22 mg via INTRAVENOUS

## 2019-06-04 MED ORDER — ACETAMINOPHEN 325 MG PO TABS
650.0000 mg | ORAL_TABLET | Freq: Four times a day (QID) | ORAL | Status: DC | PRN
  Administered 2019-06-06: 650 mg via ORAL
  Filled 2019-06-04: qty 2

## 2019-06-04 MED ORDER — IPRATROPIUM-ALBUTEROL 0.5-2.5 (3) MG/3ML IN SOLN
3.0000 mL | Freq: Once | RESPIRATORY_TRACT | Status: AC
  Administered 2019-06-04: 09:00:00 3 mL via RESPIRATORY_TRACT
  Filled 2019-06-04: qty 3

## 2019-06-04 MED ORDER — FAMOTIDINE 20 MG PO TABS
40.0000 mg | ORAL_TABLET | Freq: Every day | ORAL | Status: DC
  Administered 2019-06-04 – 2019-06-05 (×2): 40 mg via ORAL
  Filled 2019-06-04 (×2): qty 2

## 2019-06-04 MED ORDER — LORATADINE 10 MG PO TABS
10.0000 mg | ORAL_TABLET | Freq: Every day | ORAL | Status: DC
  Administered 2019-06-04 – 2019-06-06 (×3): 10 mg via ORAL
  Filled 2019-06-04 (×3): qty 1

## 2019-06-04 MED ORDER — HYDROXYZINE HCL 25 MG PO TABS
25.0000 mg | ORAL_TABLET | Freq: Three times a day (TID) | ORAL | Status: DC | PRN
  Administered 2019-06-06: 25 mg via ORAL
  Filled 2019-06-04: qty 1

## 2019-06-04 MED ORDER — METHYLPREDNISOLONE SODIUM SUCC 125 MG IJ SOLR
60.0000 mg | Freq: Three times a day (TID) | INTRAMUSCULAR | Status: DC
  Administered 2019-06-04 – 2019-06-05 (×3): 60 mg via INTRAVENOUS
  Filled 2019-06-04 (×3): qty 2

## 2019-06-04 MED ORDER — ALBUTEROL SULFATE (2.5 MG/3ML) 0.083% IN NEBU: 2.5000 mg | INHALATION_SOLUTION | Freq: Four times a day (QID) | RESPIRATORY_TRACT | Status: DC | PRN

## 2019-06-04 MED ORDER — BISACODYL 5 MG PO TBEC: 5.0000 mg | DELAYED_RELEASE_TABLET | Freq: Every day | ORAL | Status: DC | PRN

## 2019-06-04 MED ORDER — ONDANSETRON HCL 4 MG/2ML IJ SOLN: 4.0000 mg | Freq: Four times a day (QID) | INTRAMUSCULAR | Status: DC | PRN

## 2019-06-04 NOTE — ED Triage Notes (Signed)
Pt arrives via EMS from home with c/o sob. EMS had been called out 2 prior times to the home through the night. Sig hx of COPD. On arrival to scene, pt was tripoding, in obvious distress, diminished lung sounds. Pt was given 1 treatment, epi, magnesium and solumedrol. IV established in the left AC #18. Initial saturations in the 60's, NRB was placed

## 2019-06-04 NOTE — Progress Notes (Signed)
RT was checking on pt, saw he was in respiratory distress with a sat of 78% and placed pt on nonrebreathing mask. Pt sats went up to 95% after about a minute. MD ordered BiPAP at 16/6 50%, pt tolerating mask very well at this time with a sat of 100%. RT will titrate pt off BiPAP in 30-45 minutes per MD. Will continue to monitor.

## 2019-06-04 NOTE — ED Notes (Signed)
Called pt's mother and gave her an update.

## 2019-06-04 NOTE — Progress Notes (Signed)
Patient arrived by EMS with NRB, Spo2 100%. Trial off on RA. Spo2 maintaining at 99%.

## 2019-06-04 NOTE — ED Notes (Signed)
Checked on patient and he was calling out yelling he could not breath. Pt repeatedly pulled off his nebulizer treatment and sat at the end of the bed in tripod position. Oxygen sat read at 95%. Switched him to non-rebreather for 5 minutes, administered versed and put him back on the nebulizer.
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
## 2019-06-04 NOTE — Progress Notes (Signed)
Pt taken off BiPAP and placed on 2L [place]. Pt tolerating well at this time. Pt has PRN BiPAP order. Will continue to monitor.

## 2019-06-04 NOTE — Progress Notes (Signed)
7/23/220 Patient transfer from the emergency room to [facility]. He is alert, oriented and ambulatory. It was reported patient had a bruise on right upper thigh, and abrasion on right upper cheek. Rico Sheehan RN

## 2019-06-04 NOTE — ED Provider Notes (Addendum)
  Physical Exam  BP (!) 150/87 (BP Location: Right Arm)   Pulse 98   Temp 98.3 F (36.8 C)   Resp (!) 26   Ht 6\' 4"  (1.93 m)   Wt 72.6 kg   SpO2 95%   BMI 19.48 kg/m   Physical Exam  ED Course/Procedures     .Critical Care Performed by: Varney Biles, MD Authorized by: Varney Biles, MD   Critical care provider statement:    Critical care time (minutes):  32   Critical care was necessary to treat or prevent imminent or life-threatening deterioration of the following conditions:  Respiratory failure   Critical care was time spent personally by me on the following activities:  Discussions with consultants, evaluation of patient's response to treatment, examination of patient, ordering and performing treatments and interventions, ordering and review of laboratory studies, ordering and review of radiographic studies, pulse oximetry, re-evaluation of patient's condition, obtaining history from patient or surrogate and review of old charts  ARTERIAL BLOOD GAS  Date/Time: 06/04/2019 10:09 AM Performed by: Varney Biles, MD Authorized by: Varney Biles, MD   Consent:    Consent obtained:  Emergent situation   Consent given by:  Patient   Alternatives discussed:  No treatment Universal protocol:    Procedure explained and questions answered to patient or proxy's satisfaction: yes     Patient identity confirmed:  Arm band Procedure details:    Location:  L radial   Number of attempts:  1 Post-procedure details:    Dressing applied: yes     Bleeding:  Hemostasis achieved   Circulation, movement, and sensation:  Normal   Patient tolerance of procedure:  Tolerated well, no immediate complications    MDM     Pt comes in with cc of dib. He has hx of polysubstance abuse (tobacco, cocaine, narcotics), bipolar disorder, atrial fibrillation, poorly controlled COPD. He comes in with shortness of breath. No respiratory failure and appropriate meds ordered. He is wheezing  all over and will likely need multiple nebs and possibly admission.    Varney Biles, MD 06/04/19 8938  10:05 AM I reassessed patient about an hour ago.  He was still wheezing.  COVID-19 test was negative.  I ordered nebulizer treatment.  According to nursing staff, towards the end of the nebulizer treatment patient started shouting for help.  When they arrived patient was tripoding and sweating.  He stated that he was unable to breathe and he thought maybe he was having a panic attack.  When I assessed the patient he was tripoding and diaphoretic.  Patient was hard to direct.  We gave him Versed, and he continued to be in respiratory distress.  There was no hypoxia noted.  I performed a bedside ABG which shows no hypercapnia or hypoxemia.  He still had persistent wheezing.  Chest x-ray does not show pulmonary edema.  We will get an EKG.  We have placed patient on BiPAP and ketamine was ordered. He is currently more directable right now.  With him still wheezing, I do think he is ready to go home.  The symptoms he is having right now could be related to his substance abuse which could be a side effect of the albuterol-as effectively we have ruled out pneumothorax, pulmonary edema or hypercapnic respiratory failure.    Varney Biles, MD 06/04/19 1010
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
## 2019-06-05 LAB — BASIC METABOLIC PANEL
Anion gap: 8 (ref 5–15)
BUN: 19 mg/dL (ref 6–20)
CO2: 23 mmol/L (ref 22–32)
Calcium: 8.8 mg/dL — ABNORMAL LOW (ref 8.9–10.3)
Chloride: 107 mmol/L (ref 98–111)
Creatinine, Ser: 0.89 mg/dL (ref 0.61–1.24)
GFR calc Af Amer: >60 mL/min (ref >60)
GFR calc non Af Amer: >60 mL/min (ref >60)
Glucose, Bld: 119 mg/dL — ABNORMAL HIGH (ref 70–99)
Potassium: 4.2 mmol/L (ref 3.5–5.1)
Sodium: 138 mmol/L (ref 135–145)

## 2019-06-05 LAB — RAPID URINE DRUG SCREEN, HOSP PERFORMED
Amphetamines: NOT DETECTED
Barbiturates: NOT DETECTED
Benzodiazepines: POSITIVE — AB
Cocaine: POSITIVE — AB
Opiates: POSITIVE — AB
Tetrahydrocannabinol: NOT DETECTED

## 2019-06-05 MED ORDER — ZOLPIDEM TARTRATE 5 MG PO TABS
5.0000 mg | ORAL_TABLET | Freq: Once | ORAL | Status: AC
  Administered 2019-06-05: 5 mg via ORAL
  Filled 2019-06-05: qty 1

## 2019-06-05 MED ORDER — METHYLPREDNISOLONE SODIUM SUCC 40 MG IJ SOLR
40.0000 mg | Freq: Two times a day (BID) | INTRAMUSCULAR | Status: DC
  Administered 2019-06-05 – 2019-06-06 (×2): 40 mg via INTRAVENOUS
  Filled 2019-06-05 (×3): qty 1
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
## 2019-06-05 NOTE — Progress Notes (Signed)
Pt not on BiPAP at this time. 

## 2019-06-06 DIAGNOSIS — J449 Chronic obstructive pulmonary disease, unspecified: Secondary | ICD-10-CM

## 2019-06-06 LAB — BASIC METABOLIC PANEL
Anion gap: 9 (ref 5–15)
BUN: 17 mg/dL (ref 6–20)
CO2: 21 mmol/L — ABNORMAL LOW (ref 22–32)
Calcium: 8.9 mg/dL (ref 8.9–10.3)
Chloride: 106 mmol/L (ref 98–111)
Creatinine, Ser: 0.84 mg/dL (ref 0.61–1.24)
GFR calc Af Amer: >60 mL/min (ref >60)
GFR calc non Af Amer: >60 mL/min (ref >60)
Glucose, Bld: 116 mg/dL — ABNORMAL HIGH (ref 70–99)
Potassium: 4.2 mmol/L (ref 3.5–5.1)
Sodium: 136 mmol/L (ref 135–145)

## 2019-06-06 LAB — CBC
HCT: 39.9 % (ref 39.0–52.0)
Hemoglobin: 13.2 g/dL (ref 13.0–17.0)
MCH: 29.9 pg (ref 26.0–34.0)
MCHC: 33.1 g/dL (ref 30.0–36.0)
MCV: 90.3 fL (ref 80.0–100.0)
Platelets: 299 10*3/uL (ref 150–400)
RBC: 4.42 MIL/uL (ref 4.22–5.81)
RDW: 13.7 % (ref 11.5–15.5)
WBC: 14.7 10*3/uL — ABNORMAL HIGH (ref 4.0–10.5)
nRBC: 0 % (ref 0.0–0.2)

## 2019-06-06 MED ORDER — DULERA 200-5 MCG/ACT IN AERO: 2.0000 | INHALATION_SPRAY | Freq: Two times a day (BID) | RESPIRATORY_TRACT | 1 refills | Status: DC

## 2019-06-06 MED ORDER — LORAZEPAM 2 MG/ML IJ SOLN
1.0000 mg | INTRAMUSCULAR | Status: DC | PRN
  Administered 2019-06-06: 1 mg via INTRAVENOUS
  Filled 2019-06-06: qty 1

## 2019-06-06 MED ORDER — HYDROCODONE-ACETAMINOPHEN 5-325 MG PO TABS: 1.0000 | ORAL_TABLET | Freq: Four times a day (QID) | ORAL | Status: DC | PRN

## 2019-06-06 MED ORDER — PREDNISONE 10 MG PO TABS: ORAL_TABLET | ORAL | 0 refills | Status: DC

## 2019-06-06 NOTE — Care Management (Signed)
Patient provided with MATCH letter.  
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
## 2019-07-01 ENCOUNTER — Ambulatory Visit (HOSPITAL_BASED_OUTPATIENT_CLINIC_OR_DEPARTMENT_OTHER): Payer: Self-pay | Admitting: Pharmacist

## 2019-07-01 ENCOUNTER — Encounter: Payer: Self-pay | Admitting: Critical Care Medicine

## 2019-07-01 ENCOUNTER — Ambulatory Visit: Payer: Self-pay | Attending: Critical Care Medicine | Admitting: Critical Care Medicine

## 2019-07-01 ENCOUNTER — Ambulatory Visit: Payer: Self-pay | Attending: Family Medicine | Admitting: Licensed Clinical Social Worker

## 2019-07-01 ENCOUNTER — Other Ambulatory Visit: Payer: Self-pay

## 2019-07-01 VITALS — BP 124/84 | HR 107 | Temp 98.2°F | Resp 16 | Ht 76.0 in | Wt 159.4 lb

## 2019-07-01 DIAGNOSIS — F1414 Cocaine abuse with cocaine-induced mood disorder: Secondary | ICD-10-CM

## 2019-07-01 DIAGNOSIS — Z599 Problem related to housing and economic circumstances, unspecified: Secondary | ICD-10-CM

## 2019-07-01 DIAGNOSIS — J449 Chronic obstructive pulmonary disease, unspecified: Secondary | ICD-10-CM

## 2019-07-01 DIAGNOSIS — Z79899 Other long term (current) drug therapy: Secondary | ICD-10-CM

## 2019-07-01 DIAGNOSIS — F319 Bipolar disorder, unspecified: Secondary | ICD-10-CM

## 2019-07-01 DIAGNOSIS — Z598 Other problems related to housing and economic circumstances: Secondary | ICD-10-CM

## 2019-07-01 DIAGNOSIS — F1910 Other psychoactive substance abuse, uncomplicated: Secondary | ICD-10-CM

## 2019-07-01 DIAGNOSIS — J4551 Severe persistent asthma with (acute) exacerbation: Secondary | ICD-10-CM

## 2019-07-01 MED ORDER — ALBUTEROL SULFATE (2.5 MG/3ML) 0.083% IN NEBU: 2.5000 mg | INHALATION_SOLUTION | Freq: Four times a day (QID) | RESPIRATORY_TRACT | 12 refills | Status: DC | PRN

## 2019-07-01 MED ORDER — DULERA 200-5 MCG/ACT IN AERO: 2.0000 | INHALATION_SPRAY | Freq: Two times a day (BID) | RESPIRATORY_TRACT | 4 refills | Status: DC

## 2019-07-01 MED ORDER — ALBUTEROL SULFATE HFA 108 (90 BASE) MCG/ACT IN AERS: 2.0000 | INHALATION_SPRAY | Freq: Four times a day (QID) | RESPIRATORY_TRACT | 3 refills | Status: DC | PRN

## 2019-07-01 MED ORDER — AZITHROMYCIN 250 MG PO TABS: ORAL_TABLET | ORAL | 0 refills | Status: DC

## 2019-07-01 MED ORDER — PREDNISONE 10 MG PO TABS: ORAL_TABLET | ORAL | 0 refills | Status: DC

## 2019-07-01 MED ORDER — SPIRIVA HANDIHALER 18 MCG IN CAPS: 18.0000 ug | ORAL_CAPSULE | Freq: Every day | RESPIRATORY_TRACT | 3 refills | Status: DC

## 2019-07-01 MED FILL — AZITHROMYCIN 250 MG TABLET: 250 | 5 days supply | Qty: 6 | Fill #0

## 2019-07-01 MED FILL — ALBUTEROL SUL 2.5 MG/3 ML S: (2.5 MG/3ML | 6 days supply | Qty: 75 | Fill #0

## 2019-07-01 MED FILL — ALBUTEROL SULFATE HFA 108 (: 108 (90 BAS | 25 days supply | Qty: 18 | Fill #0

## 2019-07-01 MED FILL — predniSONE 10 MG TABS: 10 | 5 days supply | Qty: 20 | Fill #0

## 2019-07-01 MED FILL — !DULERA 200 MCG/5 MCG INH: 200-5 | 30 days supply | Qty: 13 | Fill #0

## 2019-07-01 MED FILL — SPIRIVA 18 MCG CP-HANDIHALE: 18 | 30 days supply | Qty: 30 | Fill #0

## 2019-07-01 NOTE — Progress Notes (Signed)
Patient was seen during encounter with Dr. Joya Gaskins for influenza and tetanus vaccinations. Consent given and counseling offered. Pt with no contraindications or objections to vaccination. As he is uninsured, we will coordinate patient assistance with the pharmacy for Adacel.
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
## 2019-07-01 NOTE — Assessment & Plan Note (Signed)
Ongoing polysubstance abuse including opiates benzodiazepines and cocaine with recent positive urine drug screen in July for same  I connected the patient today with our licensed clinical social worker who will attempt to connect this patient to mental health services he clearly also needs substance abuse counseling as well as his bipolar treatment

## 2019-07-01 NOTE — Assessment & Plan Note (Signed)
Patient has COPD with asthma overlap syndrome with recent exacerbation and lack of adherence to medication program  Refills on Dulera were made and also have added Spiriva 1 capsule daily  We will sign the patient out for patient assistance and also refill the albuterol  Recurrent exacerbation we will administer azithromycin for 5 days and pulse dose of prednisone  We will have the patient come back for short-term follow-up

## 2019-07-01 NOTE — Assessment & Plan Note (Addendum)
The patient claims to be abstinent from both cocaine and opiates at this time  However drug screen obtained during the last hospitalization July 23 showed that he was positive for cocaine and benzodiazepines and opiates

## 2019-07-01 NOTE — Patient Instructions (Addendum)
Begin prednisone 10 mg tablet take 4 daily for 5 days  Begin azithromycin take 2 the first day then 1 a day till gone  Resume Dulera 2 inhalations twice daily we will obtain for you patient assistance  Begin Spiriva 1 capsule place and HandiHaler and take 2 inhalations off 1 capsule daily  Refills on your albuterol inhaler and solution were given  A tetanus vaccine and flu vaccine were given today  Our licensed clinical social worker Jasmine connected with you today to discuss potential mental health resources  Return in follow-up to see Dr. Joya Gaskins in 3 weeks    Td Vaccine (Tetanus and Diphtheria): What You Need to Know 1. Why get vaccinated? Tetanus  and diphtheria are very serious diseases. They are rare in the [place] today, but people who do become infected often have severe complications. Td vaccine is used to protect adolescents and adults from both of these diseases. Both tetanus and diphtheria are infections caused by bacteria. Diphtheria spreads from person to person through coughing or sneezing. Tetanus-causing bacteria enter the body through cuts, scratches, or wounds. TETANUS (Lockjaw) causes painful muscle tightening and stiffness, usually all over the body.  It can lead to tightening of muscles in the head and neck so you can't open your mouth, swallow, or sometimes even breathe. Tetanus kills about 1 out of every 10 people who are infected even after receiving the best medical care. DIPHTHERIA can cause a thick coating to form in the back of the throat.  It can lead to breathing problems, paralysis, heart failure, and death. Before vaccines, as many as 200,000 cases of diphtheria and hundreds of cases of tetanus were reported in the [place] each year. Since vaccination began, reports of cases for both diseases have dropped by about 99%. 2. Td vaccine Td vaccine can protect adolescents and adults from tetanus and diphtheria. Td is usually given as a booster  dose every 10 years but it can also be given earlier after a severe and dirty wound or burn. Another vaccine, called Tdap, which protects against pertussis in addition to tetanus and diphtheria, is sometimes recommended instead of Td vaccine. Your doctor or the person giving you the vaccine can give you more information. Td may safely be given at the same time as other vaccines. 3. Some people should not get this vaccine  A person who has ever had a life-threatening allergic reaction after a previous dose of any tetanus or diphtheria containing vaccine, OR has a severe allergy to any part of this vaccine, should not get Td vaccine. Tell the person giving the vaccine about any severe allergies.  Talk to your doctor if you: ? had severe pain or swelling after any vaccine containing diphtheria or tetanus, ? ever had a condition called Guillain Barr Syndrome (GBS), ? aren't feeling well on the day the shot is scheduled. 4. Risks of a vaccine reaction With any medicine, including vaccines, there is a chance of side effects. These are usually mild and go away on their own. Serious reactions are also possible but are rare. Most people who get Td vaccine do not have any problems with it. Mild Problems following Td vaccine: (Did not interfere with activities)  Pain where the shot was given (about 8 people in 10)  Redness or swelling where the shot was given (about 1 person in 4)  Mild fever (rare)  Headache (about 1 person in 4)  Tiredness (about 1 person in 4) Moderate Problems following Td  vaccine: (Interfered with activities, but did not require medical attention)  Fever over 102F (rare) Severe Problems following Td vaccine: (Unable to perform usual activities; required medical attention)  Swelling, severe pain, bleeding and/or redness in the arm where the shot was given (rare). Problems that could happen after any vaccine:  People sometimes faint after a medical procedure, including  vaccination. Sitting or lying down for about 15 minutes can help prevent fainting, and injuries caused by a fall. Tell your doctor if you feel dizzy, or have vision changes or ringing in the ears.  Some people get severe pain in the shoulder and have difficulty moving the arm where a shot was given. This happens very rarely.  Any medication can cause a severe allergic reaction. Such reactions from a vaccine are very rare, estimated at fewer than 1 in a million doses, and would happen within a few minutes to a few hours after the vaccination. As with any medicine, there is a very remote chance of a vaccine causing a serious injury or death. The safety of vaccines is always being monitored. For more information, visit: http://www.aguilar.org/ 5. What if there is a serious reaction? What should I look for?  Look for anything that concerns you, such as signs of a severe allergic reaction, very high fever, or unusual behavior. Signs of a severe allergic reaction can include hives, swelling of the face and throat, difficulty breathing, a fast heartbeat, dizziness, and weakness. These would usually start a few minutes to a few hours after the vaccination. What should I do?  If you think it is a severe allergic reaction or other emergency that can't wait, call 9-1-1 or get the person to the nearest hospital. Otherwise, call your doctor.  Afterward, the reaction should be reported to the Vaccine Adverse Event Reporting System (VAERS). Your doctor might file this report, or you can do it yourself through the VAERS web site at www.vaers.SamedayNews.es, or by calling 2193966515. VAERS does not give medical advice. 6. The National Vaccine Injury Compensation Program The [employer] Vaccine Injury Compensation Program (VICP) is a federal program that was created to compensate people who may have been injured by certain vaccines. Persons who believe they may have been injured by a vaccine can learn about the  program and about filing a claim by calling (931)081-4954 or visiting the [employer] website at GoldCloset.com.ee. There is a time limit to file a claim for compensation. 7. How can I learn more?  Ask your doctor. He or she can give you the vaccine package insert or suggest other sources of information.  Call your local or state health department.  Contact the Centers for Disease Control and Prevention (CDC): ? Call (234) 239-7706 (1-800-CDC-INFO) ? Visit CDC's website at http://hunter.com/ Vaccine Information Statement Td Vaccine (02/21/16) This information is not intended to replace advice given to you by your health care provider. Make sure you discuss any questions you have with your health care provider. Document Released: 08/26/2006 Document Revised: 06/16/2018 Document Reviewed: 06/16/2018 Elsevier Interactive Patient Education  [place].     Influenza Virus Vaccine injection (Fluarix) What is this medicine? INFLUENZA VIRUS VACCINE (in floo EN zuh VAHY ruhs vak SEEN) helps to reduce the risk of getting influenza also known as the flu. This medicine may be used for other purposes; ask your health care provider or pharmacist if you have questions. COMMON BRAND NAME(S): Fluarix, Fluzone What should I tell my health care provider before I take this medicine? They need to know  if you have any of these conditions:  bleeding disorder like hemophilia  fever or infection  Guillain-Barre syndrome or other neurological problems  immune system problems  infection with the human immunodeficiency virus (HIV) or AIDS  low blood platelet counts  multiple sclerosis  an unusual or allergic reaction to influenza virus vaccine, eggs, chicken proteins, latex, gentamicin, other medicines, foods, dyes or preservatives  pregnant or trying to get pregnant  breast-feeding How should I use this medicine? This vaccine is for injection into a muscle. It is given by a  health care professional. A copy of Vaccine Information Statements will be given before each vaccination. Read this sheet carefully each time. The sheet may change frequently. Talk to your pediatrician regarding the use of this medicine in children. Special care may be needed. Overdosage: If you think you have taken too much of this medicine contact a poison control center or emergency room at once. NOTE: This medicine is only for you. Do not share this medicine with others. What if I miss a dose? This does not apply. What may interact with this medicine?  chemotherapy or radiation therapy  medicines that lower your immune system like etanercept, anakinra, infliximab, and adalimumab  medicines that treat or prevent blood clots like warfarin  phenytoin  steroid medicines like prednisone or cortisone  theophylline  vaccines This list may not describe all possible interactions. Give your health care provider a list of all the medicines, herbs, non-prescription drugs, or dietary supplements you use. Also tell them if you smoke, drink alcohol, or use illegal drugs. Some items may interact with your medicine. What should I watch for while using this medicine? Report any side effects that do not go away within 3 days to your doctor or health care professional. Call your health care provider if any unusual symptoms occur within 6 weeks of receiving this vaccine. You may still catch the flu, but the illness is not usually as bad. You cannot get the flu from the vaccine. The vaccine will not protect against colds or other illnesses that may cause fever. The vaccine is needed every year. What side effects may I notice from receiving this medicine? Side effects that you should report to your doctor or health care professional as soon as possible:  allergic reactions like skin rash, itching or hives, swelling of the face, lips, or tongue Side effects that usually do not require medical attention  (report to your doctor or health care professional if they continue or are bothersome):  fever  headache  muscle aches and pains  pain, tenderness, redness, or swelling at site where injected  weak or tired This list may not describe all possible side effects. Call your doctor for medical advice about side effects. You may report side effects to FDA at 1-800-FDA-1088. Where should I keep my medicine? This vaccine is only given in a clinic, pharmacy, doctor's office, or other health care setting and will not be stored at home. NOTE: This sheet is a summary. It may not cover all possible information. If you have questions about this medicine, talk to your doctor, pharmacist, or health care provider.  2020 Elsevier/Gold Standard (2008-05-26 09:30:40)

## 2019-07-01 NOTE — Assessment & Plan Note (Signed)
As per COPD with asthma assessment

## 2019-07-06 NOTE — BH Specialist Note (Signed)
Integrated Behavioral Health Initial Visit  MRN: [id number] Name: Kenneth Mcdowell  Number of [place] Clinician visits:: 1/6 Session Start time: 10:15 AM  Session End time: 10:35 AM Total time: 20 minutes  Type of Service: [place] Interpretor:No. Interpretor Name and Language: NA   Warm Hand Off Completed.       SUBJECTIVE: Kenneth Mcdowell is a 53 y.o. male accompanied by self Patient was referred by Dr. Joya Gaskins for mental health and substance use resources. Patient reports the following symptoms/concerns: Pt reports difficulty obtaining medications and was open to Financial Counseling  Duration of problem: Ongoing; Severity of problem: na  OBJECTIVE: Mood: Anxious and Affect: Appropriate Risk of harm to self or others: No plan to harm self or others  LIFE CONTEXT: Family and Social: Pt resides with mother School/Work: Pt is unemployed and is experiencing financial strain Self-Care: Pt enjoys spending time with animals (Hank, Land, and Bronson) Life Changes: Pt reports financial strain and difficulty affording medications. He shared that fiance passed away at the beginning of the year; however, is not interested in behavioral health or substance use resources  GOALS ADDRESSED: Patient will: 1. Reduce symptoms of: stress 2. Increase knowledge and/or ability of: coping skills  3. Demonstrate ability to: Improve medication compliance  INTERVENTIONS: Interventions utilized: Solution-Focused Strategies, Psychoeducation and/or Health Education and Link to [employer]  Standardized Assessments completed: Patient declined screening  ASSESSMENT: Patient currently experiencing stress triggered by psychosocial stressors. Pt reports financial strain and difficulty affording medications. He shared that fiance passed away at the beginning of the year; however, is not interested in behavioral health or substance use  resources. Denies SI/HI.   Patient may benefit from psychotherapy and medication management; however, pt is not interested at this time. LCSW discussed stages of grief and healthy coping skills to decrease and/or manage symptoms. Pt was provided financial counseling application to assist with affording medications and medical coverage.    PLAN: 1. Follow up with behavioral health clinician on : Schedule follow up appointment, if needed 2. Behavioral recommendations: LCSW recommends pt utilize healthy coping skills and schedule appointment with financial counseling 3. Referral(s): [place] (In Clinic) 4. "From scale of 1-10, how likely are you to follow plan?":   Rebekah Chesterfield, LCSW 07/06/2019 4:56 PM

## 2019-07-22 ENCOUNTER — Ambulatory Visit: Payer: Self-pay | Attending: Critical Care Medicine | Admitting: Critical Care Medicine

## 2019-07-22 ENCOUNTER — Encounter: Payer: Self-pay | Admitting: Critical Care Medicine

## 2019-07-22 ENCOUNTER — Other Ambulatory Visit: Payer: Self-pay

## 2019-07-22 VITALS — BP 128/85 | HR 90 | Temp 98.4°F | Resp 16 | Wt 168.2 lb

## 2019-07-22 DIAGNOSIS — F1414 Cocaine abuse with cocaine-induced mood disorder: Secondary | ICD-10-CM

## 2019-07-22 DIAGNOSIS — J449 Chronic obstructive pulmonary disease, unspecified: Secondary | ICD-10-CM

## 2019-07-22 DIAGNOSIS — J4551 Severe persistent asthma with (acute) exacerbation: Secondary | ICD-10-CM

## 2019-07-22 DIAGNOSIS — F1910 Other psychoactive substance abuse, uncomplicated: Secondary | ICD-10-CM

## 2019-07-22 MED ORDER — PREDNISONE 10 MG PO TABS: ORAL_TABLET | ORAL | 0 refills | Status: DC

## 2019-07-22 MED FILL — predniSONE 10 MG TABS: 10 | 5 days supply | Qty: 20 | Fill #0
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
## 2019-07-22 NOTE — Assessment & Plan Note (Signed)
The patient states he is not actively using cocaine nevertheless due to the patient's bipolar disorder I have asked him to reconnect with a mental health provider he says he will take this under advisement

## 2019-07-22 NOTE — Assessment & Plan Note (Signed)
Recurrent asthma exacerbation with associated COPD overlap  Refills on Dulera and Spiriva were given and another round of prednisone 40 mg daily for 5 days was issued

## 2019-07-22 NOTE — Patient Instructions (Signed)
Refills on your inhalers sent to our pharmacy  Take prednisone 10mg  4 daily for 5 days then stop  I will ask my social worker to call you about an appointment with a behavioral therapy specialist  Return to see Dr Joya Gaskins in 2 months

## 2019-07-27 MED FILL — ALBUTEROL SULFATE HFA 108 (: 108 (90 BAS | 25 days supply | Qty: 18 | Fill #1

## 2019-07-27 MED FILL — DULERA 200 MCG/5 MCG INH: 200-5 | 30 days supply | Qty: 13 | Fill #1

## 2019-07-27 MED FILL — SPIRIVA 18 MCG CP-HANDIHALE: 18 | 30 days supply | Qty: 30 | Fill #1

## 2019-08-17 MED FILL — ALBUTEROL SULFATE HFA 108 (: 108 (90 BAS | 25 days supply | Qty: 18 | Fill #2

## 2019-08-17 MED FILL — DULERA 200 MCG/5 MCG INH: 200-5 | 30 days supply | Qty: 13 | Fill #2

## 2019-08-17 MED FILL — SPIRIVA 18 MCG CP-HANDIHALE: 18 | 30 days supply | Qty: 30 | Fill #2

## 2019-09-21 ENCOUNTER — Ambulatory Visit: Payer: Self-pay | Admitting: Critical Care Medicine

## 2019-09-28 MED FILL — ALBUTEROL SULFATE HFA 108 (: 108 (90 BAS | 25 days supply | Qty: 18 | Fill #3

## 2019-09-30 ENCOUNTER — Telehealth: Payer: Self-pay

## 2019-09-30 NOTE — Telephone Encounter (Signed)
For cost reasons and pt non-compliance as far a PASS application process is concerned, may we change therapy for pt to Incruse and Advair?  I will reach out to pt 1 last time requesting the information and signatures needed for PASS for Spiriva and Dulera, but I have been requesting this info since 06/2019.

## 2019-10-01 MED ORDER — INCRUSE ELLIPTA 62.5 MCG/INH IN AEPB: 1.0000 | INHALATION_SPRAY | Freq: Every day | RESPIRATORY_TRACT | 3 refills | Status: DC

## 2019-10-01 MED ORDER — FLUTICASONE-SALMETEROL 250-50 MCG/DOSE IN AEPB: 1.0000 | INHALATION_SPRAY | Freq: Two times a day (BID) | RESPIRATORY_TRACT | 6 refills | Status: DC

## 2019-10-01 MED FILL — !INCRUSE ELLIPTA 62.5 MCG I: 62.5 | 30 days supply | Qty: 30 | Fill #0

## 2019-10-01 MED FILL — !ADVAIR 250/50 DISKUS: 250-50 | 30 days supply | Qty: 60 | Fill #0

## 2019-10-01 NOTE — Telephone Encounter (Signed)
Yes, I will order incruse and advair Claiborne Billings

## 2019-10-13 ENCOUNTER — Ambulatory Visit: Payer: Self-pay | Admitting: Critical Care Medicine

## 2019-10-14 ENCOUNTER — Other Ambulatory Visit: Payer: Self-pay

## 2019-10-14 ENCOUNTER — Encounter: Payer: Self-pay | Admitting: Critical Care Medicine

## 2019-10-14 ENCOUNTER — Ambulatory Visit: Payer: Self-pay | Attending: Critical Care Medicine | Admitting: Critical Care Medicine

## 2019-10-14 VITALS — BP 128/88 | HR 99 | Temp 98.9°F | Resp 16 | Wt 162.0 lb

## 2019-10-14 DIAGNOSIS — J4551 Severe persistent asthma with (acute) exacerbation: Secondary | ICD-10-CM

## 2019-10-14 DIAGNOSIS — J209 Acute bronchitis, unspecified: Secondary | ICD-10-CM

## 2019-10-14 DIAGNOSIS — J449 Chronic obstructive pulmonary disease, unspecified: Secondary | ICD-10-CM

## 2019-10-14 MED ORDER — PREDNISONE 10 MG PO TABS: ORAL_TABLET | ORAL | 0 refills | Status: DC

## 2019-10-14 MED ORDER — ALBUTEROL SULFATE (2.5 MG/3ML) 0.083% IN NEBU: 2.5000 mg | INHALATION_SOLUTION | Freq: Four times a day (QID) | RESPIRATORY_TRACT | 12 refills | Status: DC | PRN

## 2019-10-14 MED ORDER — AZITHROMYCIN 250 MG PO TABS: ORAL_TABLET | ORAL | 0 refills | Status: DC

## 2019-10-14 MED ORDER — INCRUSE ELLIPTA 62.5 MCG/INH IN AEPB: 1.0000 | INHALATION_SPRAY | Freq: Every day | RESPIRATORY_TRACT | 3 refills | Status: DC

## 2019-10-14 MED ORDER — ALBUTEROL SULFATE HFA 108 (90 BASE) MCG/ACT IN AERS: 2.0000 | INHALATION_SPRAY | Freq: Four times a day (QID) | RESPIRATORY_TRACT | 3 refills | Status: DC | PRN

## 2019-10-14 MED ORDER — BUDESONIDE-FORMOTEROL FUMARATE 160-4.5 MCG/ACT IN AERO: 2.0000 | INHALATION_SPRAY | Freq: Two times a day (BID) | RESPIRATORY_TRACT | 12 refills | Status: DC

## 2019-10-14 MED ORDER — METHYLPREDNISOLONE ACETATE 80 MG/ML IJ SUSP
80.0000 mg | Freq: Once | INTRAMUSCULAR | Status: AC
  Administered 2019-10-14: 11:00:00 80 mg via INTRAMUSCULAR

## 2019-10-14 MED FILL — !SYMBICORT 160-4.5 MCG INH: 160-4.5 | 30 days supply | Qty: 1 | Fill #0

## 2019-10-14 MED FILL — ALBUTEROL SUL 2.5 MG/3 ML S: (2.5 MG/3ML | 6 days supply | Qty: 75 | Fill #0

## 2019-10-14 MED FILL — !VENTOLIN HFA INHALER: 108 (90 BAS | 25 days supply | Qty: 18 | Fill #0

## 2019-10-14 MED FILL — AZITHROMYCIN 250 MG TABLET: 250 | 5 days supply | Qty: 6 | Fill #0

## 2019-10-14 MED FILL — predniSONE 10 MG TABS: 10 | 5 days supply | Qty: 20 | Fill #0

## 2019-10-14 NOTE — Patient Instructions (Signed)
Refills on all your medicines were sent to our pharmacy  Discontinue Advair and begin Symbicort inhalations take 2 of the inhalations twice daily  Continue Incruse daily  We will get you patient assistance for both Symbicort and Incruse  Take a prednisone pulse 10 mg take 4 days daily for 5 days and also we gave you a steroid injection today  Take azithromycin for 5 days  Return in short-term follow-up in 2 weeks and we will determine about getting a breathing test for you and subsequent to that consideration for disability
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
## 2019-10-15 NOTE — Assessment & Plan Note (Signed)
Reactive airways disease with associated COPD therefore COPD asthma overlap syndrome with acute exacerbation  The patient states the Advair does not work as well for him therefore we will obtain the Symbicort HFA at 2 puffs twice daily we will be able to get a sample for him today and we will get him lined up with the patient assistance program as he is self-pay.  We will also refill his albuterol both inhaler and nebulizer and obtain him a new nebulizer machine through our patient assistance program  The patient will receive a Depo-Medrol injection 80 mg IM at this visit and received pulse prednisone along with a 5-day course of azithromycin
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
## 2019-10-28 ENCOUNTER — Other Ambulatory Visit: Payer: Self-pay

## 2019-10-28 ENCOUNTER — Ambulatory Visit: Payer: Self-pay | Attending: Critical Care Medicine | Admitting: Critical Care Medicine

## 2019-10-28 ENCOUNTER — Encounter: Payer: Self-pay | Admitting: Critical Care Medicine

## 2019-10-28 VITALS — BP 140/91 | HR 101 | Resp 16 | Wt 161.2 lb

## 2019-10-28 DIAGNOSIS — R634 Abnormal weight loss: Secondary | ICD-10-CM

## 2019-10-28 DIAGNOSIS — K648 Other hemorrhoids: Secondary | ICD-10-CM | POA: Insufficient documentation

## 2019-10-28 DIAGNOSIS — J4551 Severe persistent asthma with (acute) exacerbation: Secondary | ICD-10-CM

## 2019-10-28 DIAGNOSIS — G8929 Other chronic pain: Secondary | ICD-10-CM

## 2019-10-28 DIAGNOSIS — J449 Chronic obstructive pulmonary disease, unspecified: Secondary | ICD-10-CM

## 2019-10-28 DIAGNOSIS — M25512 Pain in left shoulder: Secondary | ICD-10-CM

## 2019-10-28 MED ORDER — AZITHROMYCIN 250 MG PO TABS: ORAL_TABLET | ORAL | 0 refills | Status: DC

## 2019-10-28 MED ORDER — PREDNISONE 10 MG PO TABS: ORAL_TABLET | ORAL | 0 refills | Status: DC

## 2019-10-28 MED ORDER — HYDROCORTISONE (PERIANAL) 2.5 % EX CREA: 1.0000 "application " | TOPICAL_CREAM | Freq: Two times a day (BID) | CUTANEOUS | 0 refills | Status: DC

## 2019-10-28 MED ORDER — VITAMIN D 50 MCG (2000 UT) PO TABS: 2000.0000 [IU] | ORAL_TABLET | Freq: Every day | ORAL | 3 refills | Status: DC

## 2019-10-28 MED ORDER — METHYLPREDNISOLONE SODIUM SUCC 125 MG IJ SOLR: 125.0000 mg | Freq: Once | INTRAMUSCULAR | Status: DC

## 2019-10-28 MED ORDER — B-12 1000 MCG PO CAPS: 1.0000 | ORAL_CAPSULE | Freq: Every day | ORAL | 3 refills | Status: DC

## 2019-10-28 MED ORDER — THIAMINE HCL 100 MG PO TABS: 100.0000 mg | ORAL_TABLET | Freq: Every day | ORAL | 4 refills | Status: DC

## 2019-10-28 MED FILL — PROCTOZONE-HC 2.5 % CREA: 2.5 | 15 days supply | Qty: 30 | Fill #0

## 2019-10-28 MED FILL — SYMBICORT 160-4.5 MCG INH: 160-4.5 | 30 days supply | Qty: 10 | Fill #1

## 2019-10-28 MED FILL — AZITHROMYCIN 250 MG TABLET: 250 | 5 days supply | Qty: 6 | Fill #0

## 2019-10-28 MED FILL — predniSONE 10 MG TABS: 10 | 5 days supply | Qty: 20 | Fill #0

## 2019-10-28 MED FILL — !INCRUSE ELLIPTA 62.5 MCG I: 62.5 | 30 days supply | Qty: 30 | Fill #1

## 2019-10-28 NOTE — Assessment & Plan Note (Signed)
Chronic weight loss with poor nutrition we will check folate  Level, vitamin B1 and B12 levels and will begin thiamine 100 mg daily B12 1000 mcg daily and vitamin D 2000 units daily

## 2019-10-28 NOTE — Assessment & Plan Note (Signed)
There is an acute on chronic rectal hemorrhoidal prolapse  We will prescribe Anusol rectal cream on this and refer patient to general surgery for further evaluation

## 2019-10-28 NOTE — Patient Instructions (Signed)
A steroid injection was given  Begin prednisone again for another cycle and also another cycle of azithromycin  It appears he received the Symbicort inhaler we will give you another one today and remember this is only 2 inhalations twice a day no more than that it is a maintenance inhaler  Incruse will be issued today it is 1 inhalation daily  Discontinue the Advair inhaler  The hemorrhoid condition will be treated with Anusol cream externally to use daily and we will get you referred to general surgery  An x-ray of your right shoulder will be obtained  Return to see Dr. Joya Gaskins 2 weeks

## 2019-10-28 NOTE — Assessment & Plan Note (Signed)
Chronic left shoulder pain  We will image the right shoulder

## 2019-10-28 NOTE — Assessment & Plan Note (Addendum)
Patient with chronic asthma and COPD overlap syndrome now with worsening symptoms and will need further medication intervention  Also the patient does not understand how to use the Symbicort properly we reinstructed him that is a maintenance inhaler at 2 inhalations twice daily only we will give him a new inhaler  Patient received another Solu-Medrol injection 125 mg IM and received another pulse prednisone dosing and give another round of azithromycin  the patient will also continue Incruse 1 puff daily

## 2019-10-31 LAB — VITAMIN B12: Vitamin B-12: 363 pg/mL (ref 232–1245)

## 2019-10-31 LAB — VITAMIN B1: Thiamine: 139.5 nmol/L (ref 66.5–200.0)

## 2019-10-31 LAB — FOLATE: Folate: 13.5 ng/mL (ref >3.0)

## 2019-11-11 MED FILL — SYMBICORT 160-4.5 MCG INH: 160-4.5 | 30 days supply | Qty: 10 | Fill #2

## 2019-11-18 ENCOUNTER — Other Ambulatory Visit: Payer: Self-pay

## 2019-11-18 ENCOUNTER — Ambulatory Visit: Payer: Self-pay | Attending: Critical Care Medicine | Admitting: Critical Care Medicine

## 2019-11-18 ENCOUNTER — Encounter: Payer: Self-pay | Admitting: Critical Care Medicine

## 2019-11-18 VITALS — BP 130/90 | HR 110 | Resp 16 | Wt 171.6 lb

## 2019-11-18 DIAGNOSIS — G8929 Other chronic pain: Secondary | ICD-10-CM

## 2019-11-18 DIAGNOSIS — M25512 Pain in left shoulder: Secondary | ICD-10-CM

## 2019-11-18 DIAGNOSIS — J449 Chronic obstructive pulmonary disease, unspecified: Secondary | ICD-10-CM

## 2019-11-18 DIAGNOSIS — J4551 Severe persistent asthma with (acute) exacerbation: Secondary | ICD-10-CM

## 2019-11-18 MED ORDER — ALBUTEROL SULFATE HFA 108 (90 BASE) MCG/ACT IN AERS: 2.0000 | INHALATION_SPRAY | Freq: Four times a day (QID) | RESPIRATORY_TRACT | 3 refills | Status: DC | PRN

## 2019-11-18 MED FILL — ALBUTEROL SULFATE HFA 108 (: 108 (90 BAS | 25 days supply | Qty: 18 | Fill #0
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
## 2019-11-18 NOTE — Progress Notes (Signed)
Pt states he pulled a muscle in his left arm  Pt states he is having chest pain
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
## 2019-11-18 NOTE — Assessment & Plan Note (Signed)
Reactive airway disease and associated COPD asthma overlap syndrome stable this time we will give the patient refills on albuterol nebulizer he will maintain Symbicort no additional steroids indicated

## 2019-11-18 NOTE — Patient Instructions (Signed)
Referral to orthopedics were made for your left shoulder pain  No change in your inhalers for now  Refill on albuterol was given  Return in follow-up 2 months

## 2019-11-26 ENCOUNTER — Ambulatory Visit: Payer: Self-pay | Admitting: Orthopaedic Surgery

## 2019-12-04 MED FILL — !VENTOLIN HFA INHALER: 108 (90 BAS | 25 days supply | Qty: 18 | Fill #1

## 2019-12-04 MED FILL — ALBUTEROL SUL 2.5 MG/3 ML S: (2.5 MG/3ML | 6 days supply | Qty: 75 | Fill #1

## 2019-12-04 MED FILL — SYMBICORT 160-4.5 MCG INH: 160-4.5 | 30 days supply | Qty: 10 | Fill #3

## 2019-12-21 MED FILL — SYMBICORT 160-4.5 MCG INH: 160-4.5 | 30 days supply | Qty: 10 | Fill #4

## 2019-12-21 MED FILL — $VENTOLIN HFA 18G INHALER: 108 (90 BAS | 25 days supply | Qty: 18 | Fill #2

## 2019-12-21 MED FILL — $INCRUSE ELLIPTA 62.5 MCG I: 62.5 MCG | 30 days supply | Qty: 30 | Fill #2

## 2019-12-21 MED FILL — ALBUTEROL SUL 2.5 MG/3 ML S: (2.5 MG/3ML | 6 days supply | Qty: 75 | Fill #2

## 2020-01-12 MED FILL — $INCRUSE ELLIPTA 62.5 MCG I: 62.5 MCG | 30 days supply | Qty: 30 | Fill #3

## 2020-01-12 MED FILL — $VENTOLIN HFA 18G INHALER: 108 (90 BAS | 25 days supply | Qty: 18 | Fill #3

## 2020-01-15 MED FILL — SYMBICORT 160-4.5 MCG INH: 160-4.5 | 30 days supply | Qty: 10 | Fill #5

## 2020-01-20 ENCOUNTER — Ambulatory Visit: Payer: Self-pay | Admitting: Critical Care Medicine

## 2020-02-15 ENCOUNTER — Other Ambulatory Visit: Payer: Self-pay | Admitting: Critical Care Medicine

## 2020-02-15 MED FILL — $Symbicort 160-4.5mcg/act: 160-4.5 | 30 days supply | Qty: 1 | Fill #6

## 2020-02-15 MED FILL — $INCRUSE ELLIPTA 62.5 MCG I: 62.5 MCG | 30 days supply | Qty: 30 | Fill #0

## 2020-02-16 MED FILL — $VENTOLIN HFA 18G INHALER: 108 (90 BAS | 25 days supply | Qty: 18 | Fill #0

## 2020-03-16 MED FILL — $INCRUSE ELLIPTA 62.5 MCG I: 62.5 MCG | 90 days supply | Qty: 90 | Fill #1

## 2020-03-16 MED FILL — $VENTOLIN HFA 18G INHALER: 108 (90 BAS | 75 days supply | Qty: 54 | Fill #1

## 2020-03-16 MED FILL — $Symbicort 160-4.5mcg/act: 160-4.5 | 60 days supply | Qty: 2 | Fill #7

## 2020-08-12 ENCOUNTER — Other Ambulatory Visit: Payer: Self-pay | Admitting: Critical Care Medicine

## 2020-08-12 MED FILL — $VENTOLIN HFA 18G INHALER: 108 (90 BAS | 75 days supply | Qty: 54 | Fill #0

## 2020-08-12 MED FILL — $Symbicort 160-4.5mcg/act: 160-4.5 | 60 days supply | Qty: 2 | Fill #8

## 2020-08-12 MED FILL — ALBUTEROL SUL 2.5 MG/3 ML S: (2.5 MG/3ML | 6 days supply | Qty: 75 | Fill #3

## 2020-08-12 NOTE — Telephone Encounter (Signed)
Requested Prescriptions  Pending Prescriptions Disp Refills   VENTOLIN HFA 108 (90 Base) MCG/ACT inhaler [Pharmacy Med Name: VENTOLIN HFA 18G INHALER 108 (90 BAS Aerosol] 18 g 3    Sig: INHALE 2 PUFFS INTO THE LUNGS EVERY 6 (SIX) HOURS AS NEEDED FOR WHEEZING OR SHORTNESS OF BREATH.     Pulmonology:  Beta Agonists Failed - 08/12/2020  9:18 AM      Failed - One inhaler should last at least one month. If the patient is requesting refills earlier, contact the patient to check for uncontrolled symptoms.      Passed - Valid encounter within last 12 months    Recent Outpatient Visits          8 months ago COPD with asthma [facility])   [place] [name], MD   9 months ago Severe persistent reactive airway disease with acute exacerbation   [place] [name], MD   10 months ago Severe persistent reactive airway disease with acute exacerbation   [place] [name], MD   1 year ago COPD with asthma [facility])   [place] [name], MD   1 year ago Medication management   [place], Jarome Matin, RPH-CPP

## 2020-10-10 MED FILL — $Symbicort 160-4.5mcg/act: 160-4.5 | 60 days supply | Qty: 2 | Fill #9

## 2020-12-13 ENCOUNTER — Other Ambulatory Visit: Payer: Self-pay | Admitting: Critical Care Medicine

## 2020-12-13 MED FILL — $VENTOLIN HFA 18G INHALER: 108 (90 BAS | 24 days supply | Qty: 18 | Fill #1

## 2021-01-18 ENCOUNTER — Inpatient Hospital Stay (HOSPITAL_COMMUNITY): Payer: Self-pay | Admitting: Certified Registered Nurse Anesthetist

## 2021-01-18 ENCOUNTER — Emergency Department (HOSPITAL_COMMUNITY): Payer: Self-pay

## 2021-01-18 ENCOUNTER — Encounter (HOSPITAL_COMMUNITY): Admission: EM | Disposition: A | Discharge status: Rehabilitation Facility | Payer: Self-pay | Source: Home / Self Care

## 2021-01-18 ENCOUNTER — Inpatient Hospital Stay (HOSPITAL_COMMUNITY)
Admission: EM | Admit: 2021-01-18 | Discharge: 2021-01-25 | DRG: 957 | Disposition: A | Discharge status: Rehabilitation Facility | Payer: Self-pay | Attending: General Surgery | Admitting: General Surgery

## 2021-01-18 ENCOUNTER — Encounter (HOSPITAL_COMMUNITY): Payer: Self-pay | Admitting: Emergency Medicine

## 2021-01-18 ENCOUNTER — Inpatient Hospital Stay (HOSPITAL_COMMUNITY): Payer: Self-pay

## 2021-01-18 DIAGNOSIS — E872 Acidosis: Secondary | ICD-10-CM | POA: Diagnosis present

## 2021-01-18 DIAGNOSIS — J9601 Acute respiratory failure with hypoxia: Secondary | ICD-10-CM | POA: Diagnosis present

## 2021-01-18 DIAGNOSIS — S2242XA Multiple fractures of ribs, left side, initial encounter for closed fracture: Secondary | ICD-10-CM | POA: Diagnosis present

## 2021-01-18 DIAGNOSIS — S0181XA Laceration without foreign body of other part of head, initial encounter: Secondary | ICD-10-CM | POA: Diagnosis present

## 2021-01-18 DIAGNOSIS — S61412A Laceration without foreign body of left hand, initial encounter: Secondary | ICD-10-CM | POA: Diagnosis present

## 2021-01-18 DIAGNOSIS — S27329A Contusion of lung, unspecified, initial encounter: Secondary | ICD-10-CM

## 2021-01-18 DIAGNOSIS — N179 Acute kidney failure, unspecified: Secondary | ICD-10-CM | POA: Diagnosis present

## 2021-01-18 DIAGNOSIS — S53125A Posterior dislocation of left ulnohumeral joint, initial encounter: Secondary | ICD-10-CM

## 2021-01-18 DIAGNOSIS — S46212A Strain of muscle, fascia and tendon of other parts of biceps, left arm, initial encounter: Secondary | ICD-10-CM | POA: Diagnosis present

## 2021-01-18 DIAGNOSIS — Z7951 Long term (current) use of inhaled steroids: Secondary | ICD-10-CM | POA: Diagnosis not present

## 2021-01-18 DIAGNOSIS — S27321A Contusion of lung, unilateral, initial encounter: Secondary | ICD-10-CM | POA: Diagnosis present

## 2021-01-18 DIAGNOSIS — S61512A Laceration without foreign body of left wrist, initial encounter: Secondary | ICD-10-CM | POA: Diagnosis present

## 2021-01-18 DIAGNOSIS — T1490XA Injury, unspecified, initial encounter: Secondary | ICD-10-CM

## 2021-01-18 DIAGNOSIS — S48122A Partial traumatic amputation at level between left shoulder and elbow, initial encounter: Principal | ICD-10-CM | POA: Diagnosis present

## 2021-01-18 DIAGNOSIS — Z20822 Contact with and (suspected) exposure to covid-19: Secondary | ICD-10-CM | POA: Diagnosis present

## 2021-01-18 DIAGNOSIS — D62 Acute posthemorrhagic anemia: Secondary | ICD-10-CM | POA: Diagnosis present

## 2021-01-18 DIAGNOSIS — F319 Bipolar disorder, unspecified: Secondary | ICD-10-CM | POA: Diagnosis present

## 2021-01-18 DIAGNOSIS — J449 Chronic obstructive pulmonary disease, unspecified: Secondary | ICD-10-CM | POA: Diagnosis present

## 2021-01-18 DIAGNOSIS — K625 Hemorrhage of anus and rectum: Secondary | ICD-10-CM | POA: Diagnosis present

## 2021-01-18 DIAGNOSIS — S22078A Other fracture of T9-T10 vertebra, initial encounter for closed fracture: Secondary | ICD-10-CM | POA: Diagnosis present

## 2021-01-18 DIAGNOSIS — L89101 Pressure ulcer of unspecified part of back, stage 1: Secondary | ICD-10-CM | POA: Diagnosis present

## 2021-01-18 DIAGNOSIS — S32042A Unstable burst fracture of fourth lumbar vertebra, initial encounter for closed fracture: Secondary | ICD-10-CM | POA: Diagnosis present

## 2021-01-18 DIAGNOSIS — S51002A Unspecified open wound of left elbow, initial encounter: Secondary | ICD-10-CM

## 2021-01-18 DIAGNOSIS — K644 Residual hemorrhoidal skin tags: Secondary | ICD-10-CM | POA: Diagnosis present

## 2021-01-18 DIAGNOSIS — S6990XA Unspecified injury of unspecified wrist, hand and finger(s), initial encounter: Secondary | ICD-10-CM

## 2021-01-18 DIAGNOSIS — Z419 Encounter for procedure for purposes other than remedying health state, unspecified: Secondary | ICD-10-CM

## 2021-01-18 DIAGNOSIS — R339 Retention of urine, unspecified: Secondary | ICD-10-CM | POA: Diagnosis not present

## 2021-01-18 DIAGNOSIS — T148XXA Other injury of unspecified body region, initial encounter: Secondary | ICD-10-CM

## 2021-01-18 DIAGNOSIS — T07XXXA Unspecified multiple injuries, initial encounter: Secondary | ICD-10-CM

## 2021-01-18 DIAGNOSIS — F909 Attention-deficit hyperactivity disorder, unspecified type: Secondary | ICD-10-CM | POA: Diagnosis present

## 2021-01-18 DIAGNOSIS — S32001A Stable burst fracture of unspecified lumbar vertebra, initial encounter for closed fracture: Secondary | ICD-10-CM

## 2021-01-18 DIAGNOSIS — Y92410 Unspecified street and highway as the place of occurrence of the external cause: Secondary | ICD-10-CM

## 2021-01-18 DIAGNOSIS — Z4659 Encounter for fitting and adjustment of other gastrointestinal appliance and device: Secondary | ICD-10-CM

## 2021-01-18 DIAGNOSIS — Z23 Encounter for immunization: Secondary | ICD-10-CM

## 2021-01-18 DIAGNOSIS — S53105A Unspecified dislocation of left ulnohumeral joint, initial encounter: Secondary | ICD-10-CM

## 2021-01-18 DIAGNOSIS — S2249XA Multiple fractures of ribs, unspecified side, initial encounter for closed fracture: Secondary | ICD-10-CM

## 2021-01-18 DIAGNOSIS — Z87828 Personal history of other (healed) physical injury and trauma: Secondary | ICD-10-CM

## 2021-01-18 DIAGNOSIS — F1910 Other psychoactive substance abuse, uncomplicated: Secondary | ICD-10-CM

## 2021-01-18 DIAGNOSIS — S53106A Unspecified dislocation of unspecified ulnohumeral joint, initial encounter: Secondary | ICD-10-CM | POA: Diagnosis present

## 2021-01-18 HISTORY — PX: HEMORRHOID SURGERY: SHX153

## 2021-01-18 HISTORY — PX: I & D EXTREMITY: SHX5045

## 2021-01-18 HISTORY — PX: WOUND EXPLORATION: SHX6188

## 2021-01-18 HISTORY — DX: Posterior dislocation of left ulnohumeral joint, initial encounter: S53.125A

## 2021-01-18 HISTORY — DX: Unspecified open wound of left elbow, initial encounter: S51.002A

## 2021-01-18 LAB — URINALYSIS, ROUTINE W REFLEX MICROSCOPIC
Bacteria, UA: NONE SEEN
Bilirubin Urine: NEGATIVE
Glucose, UA: NEGATIVE mg/dL
Ketones, ur: NEGATIVE mg/dL
Leukocytes,Ua: NEGATIVE
Nitrite: NEGATIVE
Protein, ur: 30 mg/dL — AB
Specific Gravity, Urine: 1.04 — ABNORMAL HIGH (ref 1.005–1.030)
pH: 6 (ref 5.0–8.0)

## 2021-01-18 LAB — POCT I-STAT 7, (LYTES, BLD GAS, ICA,H+H)
Acid-base deficit: 4 mmol/L — ABNORMAL HIGH (ref 0.0–2.0)
Bicarbonate: 22.2 mmol/L (ref 20.0–28.0)
Calcium, Ion: 1.14 mmol/L — ABNORMAL LOW (ref 1.15–1.40)
HCT: 39 % (ref 39.0–52.0)
Hemoglobin: 13.3 g/dL (ref 13.0–17.0)
O2 Saturation: 100 %
Potassium: 4.1 mmol/L (ref 3.5–5.1)
Sodium: 141 mmol/L (ref 135–145)
TCO2: 24 mmol/L (ref 22–32)
pCO2 arterial: 43.8 mmHg (ref 32.0–48.0)
pH, Arterial: 7.314 — ABNORMAL LOW (ref 7.350–7.450)
pO2, Arterial: 331 mmHg — ABNORMAL HIGH (ref 83.0–108.0)

## 2021-01-18 LAB — RESP PANEL BY RT-PCR (FLU A&B, COVID) ARPGX2
Influenza A by PCR: NEGATIVE
Influenza B by PCR: NEGATIVE
SARS Coronavirus 2 by RT PCR: NEGATIVE

## 2021-01-18 LAB — CBC
HCT: 45.5 % (ref 39.0–52.0)
Hemoglobin: 14.6 g/dL (ref 13.0–17.0)
MCH: 30.4 pg (ref 26.0–34.0)
MCHC: 32.1 g/dL (ref 30.0–36.0)
MCV: 94.6 fL (ref 80.0–100.0)
Platelets: 347 10*3/uL (ref 150–400)
RBC: 4.81 MIL/uL (ref 4.22–5.81)
RDW: 12.8 % (ref 11.5–15.5)
WBC: 15.7 10*3/uL — ABNORMAL HIGH (ref 4.0–10.5)
nRBC: 0 % (ref 0.0–0.2)

## 2021-01-18 LAB — COMPREHENSIVE METABOLIC PANEL
ALT: 20 U/L (ref 0–44)
AST: 29 U/L (ref 15–41)
Albumin: 4.1 g/dL (ref 3.5–5.0)
Alkaline Phosphatase: 53 U/L (ref 38–126)
Anion gap: 18 — ABNORMAL HIGH (ref 5–15)
BUN: 9 mg/dL (ref 6–20)
CO2: 16 mmol/L — ABNORMAL LOW (ref 22–32)
Calcium: 8.8 mg/dL — ABNORMAL LOW (ref 8.9–10.3)
Chloride: 104 mmol/L (ref 98–111)
Creatinine, Ser: 1.52 mg/dL — ABNORMAL HIGH (ref 0.61–1.24)
GFR, Estimated: 54 mL/min — ABNORMAL LOW (ref >60)
Glucose, Bld: 311 mg/dL — ABNORMAL HIGH (ref 70–99)
Potassium: 3.8 mmol/L (ref 3.5–5.1)
Sodium: 138 mmol/L (ref 135–145)
Total Bilirubin: 0.8 mg/dL (ref 0.3–1.2)
Total Protein: 6.6 g/dL (ref 6.5–8.1)

## 2021-01-18 LAB — I-STAT CHEM 8, ED
BUN: 12 mg/dL (ref 6–20)
Calcium, Ion: 1.1 mmol/L — ABNORMAL LOW (ref 1.15–1.40)
Chloride: 105 mmol/L (ref 98–111)
Creatinine, Ser: 1.3 mg/dL — ABNORMAL HIGH (ref 0.61–1.24)
Glucose, Bld: 308 mg/dL — ABNORMAL HIGH (ref 70–99)
HCT: 45 % (ref 39.0–52.0)
Hemoglobin: 15.3 g/dL (ref 13.0–17.0)
Potassium: 4 mmol/L (ref 3.5–5.1)
Sodium: 139 mmol/L (ref 135–145)
TCO2: 20 mmol/L — ABNORMAL LOW (ref 22–32)

## 2021-01-18 LAB — LACTIC ACID, PLASMA: Lactic Acid, Venous: 7.7 mmol/L (ref 0.5–1.9)

## 2021-01-18 LAB — PROTIME-INR
INR: 1.2 (ref 0.8–1.2)
Prothrombin Time: 14.5 seconds (ref 11.4–15.2)

## 2021-01-18 LAB — ETHANOL: Alcohol, Ethyl (B): <10 mg/dL (ref <10)

## 2021-01-18 LAB — ABO/RH: ABO/RH(D): A POS
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
## 2021-01-18 IMAGING — RF DG C-ARM 1-60 MIN
1 series · 1 of 1 positions shown · non-contrast
Comparison: Preprocedural radiographs earlier today.

CLINICAL DATA: Reduction of left elbow dislocation.

EXAM:
LEFT ELBOW - 2 VIEW; DG C-ARM 1-60 MIN

[Series 1: run · 1 of 1 slices shown]
[im 1/1]
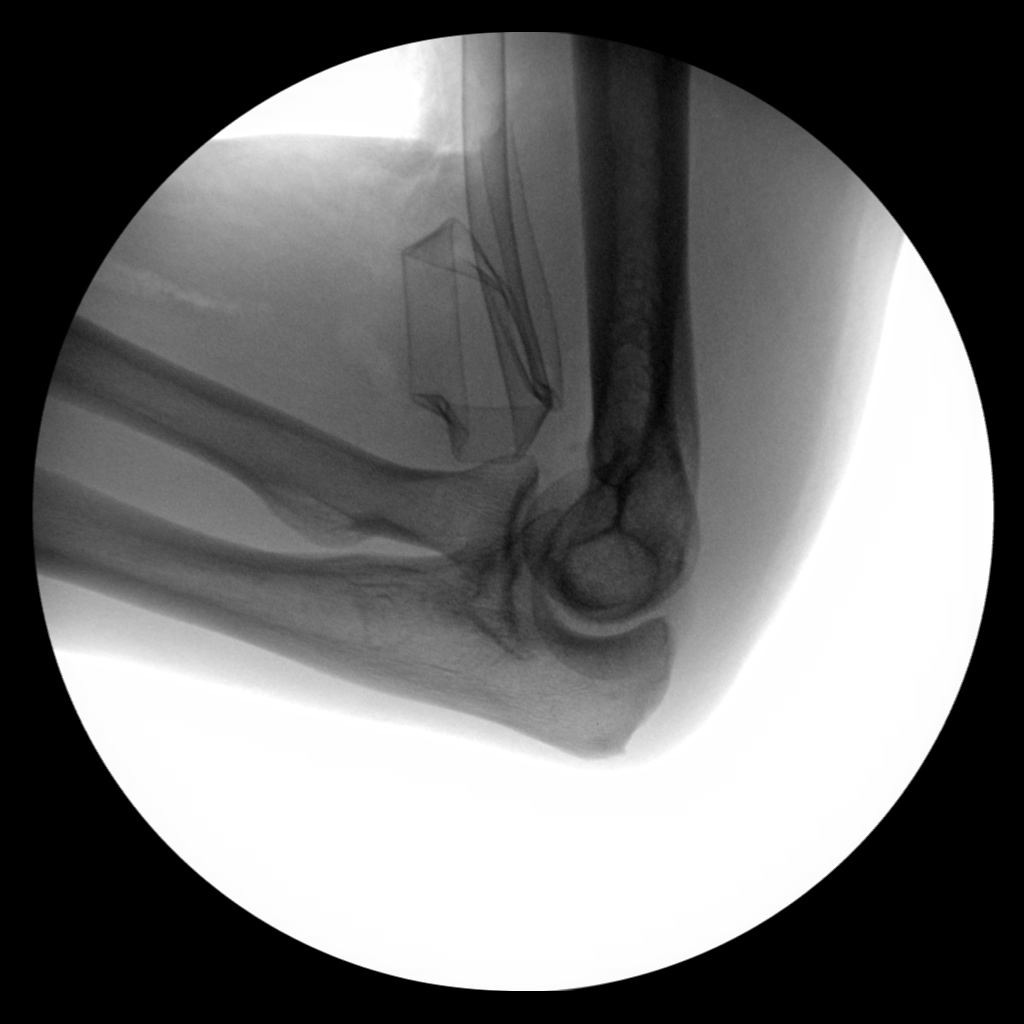

[1 of 1 positions shown; findings below may reference images not displayed]

FINDINGS: Single lateral fluoroscopic spot view of the left elbow obtained in
the operating room. Reduction of prior elbow dislocation. Small
fracture fragments on air not well seen on this fluoroscopic spot
view. Total fluoroscopy time 2 seconds. Total dose 0.14 mGy.
IMPRESSION: Lateral fluoroscopic spot view of the left elbow following reduction
of prior elbow dislocation.
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
## 2021-01-18 IMAGING — DX DG ABD PORTABLE 1V
1 series · 1 of 1 positions shown · non-contrast
Comparison: None.

CLINICAL DATA: Check gastric catheter placement

EXAM:
PORTABLE ABDOMEN - 1 VIEW

[abdomen]
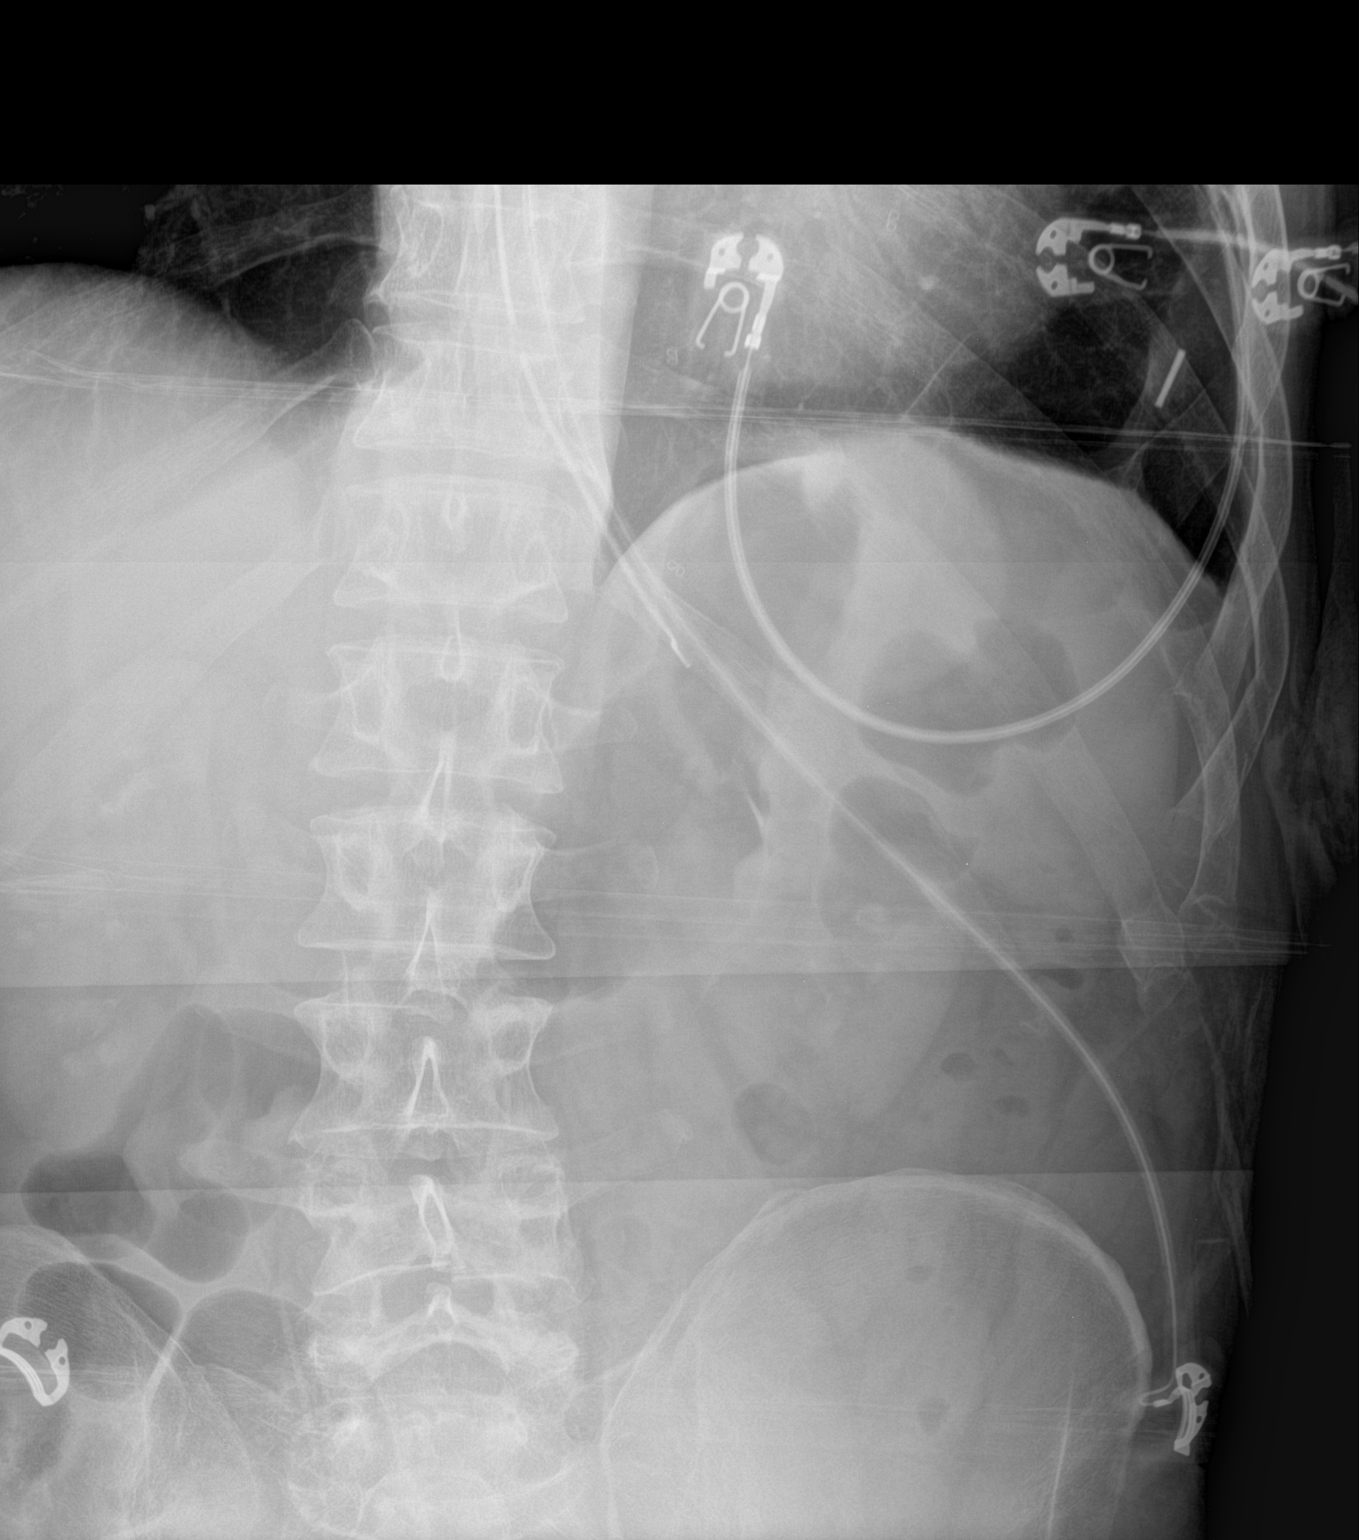

[1 of 1 positions shown; findings below may reference images not displayed]

FINDINGS: Scattered large and small bowel gas is noted. Gastric catheter is
noted with the tip in the stomach although the proximal side port
lies in the distal esophagus. This should be advanced several cm
deeper into the stomach.
IMPRESSION: Gastric catheter as described. This should be advanced several cm
deeper into the stomach.
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
## 2021-01-18 SURGERY — HEMORRHOIDECTOMY
Anesthesia: General | Site: Rectum

## 2021-01-18 MED ORDER — ROCURONIUM BROMIDE 50 MG/5ML IV SOLN
INTRAVENOUS | Status: AC | PRN
  Administered 2021-01-18: 100 mg via INTRAVENOUS

## 2021-01-18 MED ORDER — ONDANSETRON 4 MG PO TBDP: 4.0000 mg | ORAL_TABLET | Freq: Four times a day (QID) | ORAL | Status: DC | PRN

## 2021-01-18 MED ORDER — FENTANYL CITRATE (PF) 100 MCG/2ML IJ SOLN
50.0000 ug | Freq: Once | INTRAMUSCULAR | Status: DC
Start: 2021-01-18 — End: 2021-01-23

## 2021-01-18 MED ORDER — ALBUMIN HUMAN 5 % IV SOLN: INTRAVENOUS | Status: DC | PRN

## 2021-01-18 MED ORDER — DOCUSATE SODIUM 50 MG/5ML PO LIQD
100.0000 mg | Freq: Two times a day (BID) | ORAL | Status: DC
  Administered 2021-01-20 – 2021-01-22 (×5): 100 mg
  Filled 2021-01-18 (×2): qty 10

## 2021-01-18 MED ORDER — 0.9 % SODIUM CHLORIDE (POUR BTL) OPTIME
TOPICAL | Status: DC | PRN
  Administered 2021-01-18: 1000 mL
  Administered 2021-01-18: 6000 mL

## 2021-01-18 MED ORDER — FENTANYL BOLUS VIA INFUSION
50.0000 ug | INTRAVENOUS | Status: DC | PRN
  Filled 2021-01-18: qty 50

## 2021-01-18 MED ORDER — MIDAZOLAM HCL 2 MG/2ML IJ SOLN
INTRAMUSCULAR | Status: AC
  Administered 2021-01-18: 2 mg via INTRAVENOUS
  Filled 2021-01-18: qty 2

## 2021-01-18 MED ORDER — OXYCODONE HCL 5 MG PO TABS: 5.0000 mg | ORAL_TABLET | ORAL | Status: DC | PRN

## 2021-01-18 MED ORDER — CHLORHEXIDINE GLUCONATE CLOTH 2 % EX PADS
6.0000 | MEDICATED_PAD | Freq: Every day | CUTANEOUS | Status: DC
  Administered 2021-01-18 – 2021-01-22 (×5): 6 via TOPICAL

## 2021-01-18 MED ORDER — MIDAZOLAM HCL 2 MG/2ML IJ SOLN: 2.0000 mg | Freq: Once | INTRAMUSCULAR | Status: AC

## 2021-01-18 MED ORDER — ACETAMINOPHEN 500 MG PO TABS: 1000.0000 mg | ORAL_TABLET | Freq: Four times a day (QID) | ORAL | Status: DC

## 2021-01-18 MED ORDER — FENTANYL CITRATE (PF) 100 MCG/2ML IJ SOLN: 50.0000 ug | Freq: Once | INTRAMUSCULAR | Status: DC

## 2021-01-18 MED ORDER — MIDAZOLAM HCL 2 MG/2ML IJ SOLN
INTRAMUSCULAR | Status: AC
  Administered 2021-01-18: 2 mg via INTRAVENOUS
  Filled 2021-01-18: qty 4

## 2021-01-18 MED ORDER — FENTANYL BOLUS VIA INFUSION: 50.0000 ug | INTRAVENOUS | Status: DC | PRN

## 2021-01-18 MED ORDER — MIDAZOLAM HCL 2 MG/2ML IJ SOLN
2.0000 mg | INTRAMUSCULAR | Status: AC | PRN
  Administered 2021-01-19 – 2021-01-24 (×14): 2 mg via INTRAVENOUS
  Filled 2021-01-18 (×16): qty 2

## 2021-01-18 MED ORDER — FENTANYL CITRATE (PF) 250 MCG/5ML IJ SOLN
INTRAMUSCULAR | Status: AC
  Filled 2021-01-18: qty 5

## 2021-01-18 MED ORDER — SODIUM CHLORIDE 0.9 % IV BOLUS
1000.0000 mL | Freq: Once | INTRAVENOUS | Status: AC
  Administered 2021-01-18: 1000 mL via INTRAVENOUS

## 2021-01-18 MED ORDER — ONDANSETRON HCL 4 MG/2ML IJ SOLN
4.0000 mg | Freq: Four times a day (QID) | INTRAMUSCULAR | Status: DC | PRN
Start: 2021-01-18 — End: 2021-01-25
  Administered 2021-01-23 – 2021-01-25 (×2): 4 mg via INTRAVENOUS
  Filled 2021-01-18 (×2): qty 2

## 2021-01-18 MED ORDER — TETANUS-DIPHTH-ACELL PERTUSSIS 5-2.5-18.5 LF-MCG/0.5 IM SUSY
0.5000 mL | PREFILLED_SYRINGE | Freq: Once | INTRAMUSCULAR | Status: AC
  Administered 2021-01-18: 0.5 mL via INTRAMUSCULAR
  Filled 2021-01-18: qty 0.5

## 2021-01-18 MED ORDER — FENTANYL 2500MCG IN NS 250ML (10MCG/ML) PREMIX INFUSION
25.0000 ug/h | INTRAVENOUS | Status: DC
  Filled 2021-01-18: qty 250

## 2021-01-18 MED ORDER — ENOXAPARIN SODIUM 30 MG/0.3ML ~~LOC~~ SOLN
30.0000 mg | Freq: Two times a day (BID) | SUBCUTANEOUS | Status: DC
  Administered 2021-01-20 – 2021-01-21 (×2): 30 mg via SUBCUTANEOUS
  Filled 2021-01-18: qty 0.3

## 2021-01-18 MED ORDER — ROCURONIUM BROMIDE 10 MG/ML (PF) SYRINGE
PREFILLED_SYRINGE | INTRAVENOUS | Status: DC | PRN
  Administered 2021-01-18: 40 mg via INTRAVENOUS
  Administered 2021-01-18: 50 mg via INTRAVENOUS

## 2021-01-18 MED ORDER — SODIUM CHLORIDE 0.9 % IV SOLN
2.0000 g | Freq: Once | INTRAVENOUS | Status: AC
  Administered 2021-01-18: 2 g via INTRAVENOUS

## 2021-01-18 MED ORDER — LACTATED RINGERS IV SOLN: INTRAVENOUS | Status: DC

## 2021-01-18 MED ORDER — ORAL CARE MOUTH RINSE
15.0000 mL | OROMUCOSAL | Status: DC
  Administered 2021-01-18 – 2021-01-22 (×30): 15 mL via OROMUCOSAL

## 2021-01-18 MED ORDER — FENTANYL CITRATE (PF) 250 MCG/5ML IJ SOLN
INTRAMUSCULAR | Status: DC | PRN
  Administered 2021-01-18 (×3): 50 ug via INTRAVENOUS
  Administered 2021-01-18: 100 ug via INTRAVENOUS

## 2021-01-18 MED ORDER — DOCUSATE SODIUM 100 MG PO CAPS
100.0000 mg | ORAL_CAPSULE | Freq: Two times a day (BID) | ORAL | Status: DC
  Filled 2021-01-18: qty 1

## 2021-01-18 MED ORDER — METHOCARBAMOL 1000 MG/10ML IJ SOLN
1000.0000 mg | Freq: Three times a day (TID) | INTRAVENOUS | Status: DC
  Administered 2021-01-18 – 2021-01-24 (×12): 1000 mg via INTRAVENOUS
  Filled 2021-01-18 (×15): qty 10

## 2021-01-18 MED ORDER — POLYETHYLENE GLYCOL 3350 17 G PO PACK
17.0000 g | PACK | Freq: Every day | ORAL | Status: DC
  Administered 2021-01-21 – 2021-01-22 (×2): 17 g
  Filled 2021-01-18 (×2): qty 1

## 2021-01-18 MED ORDER — FENTANYL 2500MCG IN NS 250ML (10MCG/ML) PREMIX INFUSION
50.0000 ug/h | INTRAVENOUS | Status: DC
  Administered 2021-01-18 (×2): 100 ug/h via INTRAVENOUS
  Administered 2021-01-19 – 2021-01-20 (×2): 150 ug/h via INTRAVENOUS
  Administered 2021-01-20: 275 ug/h via INTRAVENOUS
  Administered 2021-01-21 (×2): 350 ug/h via INTRAVENOUS
  Administered 2021-01-21: 50 ug/h via INTRAVENOUS
  Filled 2021-01-18 (×9): qty 250

## 2021-01-18 MED ORDER — PROPOFOL 1000 MG/100ML IV EMUL
0.0000 ug/kg/min | INTRAVENOUS | Status: DC
  Administered 2021-01-18: 20 ug/kg/min via INTRAVENOUS
  Administered 2021-01-18: 45 ug/kg/min via INTRAVENOUS
  Administered 2021-01-19: 40 ug/kg/min via INTRAVENOUS
  Administered 2021-01-19 – 2021-01-21 (×7): 50 ug/kg/min via INTRAVENOUS
  Filled 2021-01-18 (×4): qty 100
  Filled 2021-01-18: qty 200
  Filled 2021-01-18: qty 100
  Filled 2021-01-18: qty 200
  Filled 2021-01-18 (×5): qty 100

## 2021-01-18 MED ORDER — IOHEXOL 350 MG/ML SOLN
100.0000 mL | Freq: Once | INTRAVENOUS | Status: AC | PRN
  Administered 2021-01-18: 100 mL via INTRAVENOUS

## 2021-01-18 MED ORDER — LACTATED RINGERS IV SOLN: INTRAVENOUS | Status: DC | PRN

## 2021-01-18 MED ORDER — PROPOFOL 10 MG/ML IV BOLUS
INTRAVENOUS | Status: AC
  Filled 2021-01-18: qty 20

## 2021-01-18 MED ORDER — CHLORHEXIDINE GLUCONATE 0.12% ORAL RINSE (MEDLINE KIT)
15.0000 mL | Freq: Two times a day (BID) | OROMUCOSAL | Status: DC
  Administered 2021-01-18 – 2021-01-22 (×7): 15 mL via OROMUCOSAL

## 2021-01-18 SURGICAL SUPPLY — 99 items
ALCOHOL 70% 16 OZ (MISCELLANEOUS) ×4 IMPLANT
BNDG CMPR 9X4 STRL LF SNTH (GAUZE/BANDAGES/DRESSINGS) ×3
BNDG CMPR MED 10X6 ELC LF (GAUZE/BANDAGES/DRESSINGS) ×3
BNDG COHESIVE 4X5 TAN STRL (GAUZE/BANDAGES/DRESSINGS) IMPLANT
BNDG ELASTIC 4X5.8 VLCR STR LF (GAUZE/BANDAGES/DRESSINGS) ×2 IMPLANT
BNDG ELASTIC 6X10 VLCR STRL LF (GAUZE/BANDAGES/DRESSINGS) ×1 IMPLANT
BNDG ELASTIC 6X5.8 VLCR STR LF (GAUZE/BANDAGES/DRESSINGS) ×1 IMPLANT
BNDG ESMARK 4X9 LF (GAUZE/BANDAGES/DRESSINGS) ×1 IMPLANT
BNDG GAUZE ELAST 4 BULKY (GAUZE/BANDAGES/DRESSINGS) ×2 IMPLANT
CANISTER SUCT 3000ML PPV (MISCELLANEOUS) ×4 IMPLANT
CORD BIPOLAR FORCEPS 12FT (ELECTRODE) ×1 IMPLANT
COVER SURGICAL LIGHT HANDLE (MISCELLANEOUS) ×8 IMPLANT
COVER WAND RF STERILE (DRAPES) ×8 IMPLANT
CUFF TOURN SGL QUICK 18X4 (TOURNIQUET CUFF) ×1 IMPLANT
CUFF TOURN SGL QUICK 24 (TOURNIQUET CUFF)
CUFF TOURN SGL QUICK 34 (TOURNIQUET CUFF)
CUFF TOURN SGL QUICK 42 (TOURNIQUET CUFF) IMPLANT
CUFF TRNQT CYL 24X4X16.5-23 (TOURNIQUET CUFF) IMPLANT
CUFF TRNQT CYL 34X4.125X (TOURNIQUET CUFF) IMPLANT
DECANTER SPIKE VIAL GLASS SM (MISCELLANEOUS) ×8 IMPLANT
DRAPE C-ARM 42X72 X-RAY (DRAPES) ×1 IMPLANT
DRAPE U-SHAPE 47X51 STRL (DRAPES) ×4 IMPLANT
DRAPE UTILITY XL STRL (DRAPES) IMPLANT
DRSG PAD ABDOMINAL 8X10 ST (GAUZE/BANDAGES/DRESSINGS) ×3 IMPLANT
DURAPREP 26ML APPLICATOR (WOUND CARE) ×3 IMPLANT
ELECT CAUTERY BLADE 6.4 (BLADE) ×3 IMPLANT
ELECT REM PT RETURN 9FT ADLT (ELECTROSURGICAL)
ELECTRODE REM PT RTRN 9FT ADLT (ELECTROSURGICAL) ×6 IMPLANT
GAUZE SPONGE 4X4 12PLY STRL (GAUZE/BANDAGES/DRESSINGS) ×10 IMPLANT
GAUZE SPONGE 4X4 12PLY STRL LF (GAUZE/BANDAGES/DRESSINGS) ×1 IMPLANT
GAUZE XEROFORM 5X9 LF (GAUZE/BANDAGES/DRESSINGS) ×2 IMPLANT
GLOVE BIO SURGEON STRL SZ 6 (GLOVE) ×4 IMPLANT
GLOVE ECLIPSE 7.0 STRL STRAW (GLOVE) ×4 IMPLANT
GLOVE ECLIPSE 8.0 STRL XLNG CF (GLOVE) ×4 IMPLANT
GLOVE SRG 8 PF TXTR STRL LF DI (GLOVE) ×3 IMPLANT
GLOVE SS BIOGEL STRL SZ 6.5 (GLOVE) IMPLANT
GLOVE SUPERSENSE BIOGEL SZ 6.5 (GLOVE) ×1
GLOVE SURG UNDER LTX SZ6.5 (GLOVE) ×4 IMPLANT
GLOVE SURG UNDER POLY LF SZ7 (GLOVE) ×5 IMPLANT
GLOVE SURG UNDER POLY LF SZ8 (GLOVE) ×4
GOWN STRL REUS W/ TWL LRG LVL3 (GOWN DISPOSABLE) ×9 IMPLANT
GOWN STRL REUS W/ TWL XL LVL3 (GOWN DISPOSABLE) ×3 IMPLANT
GOWN STRL REUS W/TWL 2XL LVL3 (GOWN DISPOSABLE) ×4 IMPLANT
GOWN STRL REUS W/TWL LRG LVL3 (GOWN DISPOSABLE) ×12
GOWN STRL REUS W/TWL XL LVL3 (GOWN DISPOSABLE) ×8
HANDPIECE INTERPULSE COAX TIP (DISPOSABLE) ×4
KIT BASIN OR (CUSTOM PROCEDURE TRAY) ×8 IMPLANT
KIT SIGMOIDOSCOPE (SET/KITS/TRAYS/PACK) ×1 IMPLANT
KIT TURNOVER KIT B (KITS) ×8 IMPLANT
MANIFOLD NEPTUNE II (INSTRUMENTS) ×4 IMPLANT
NDL HYPO 25GX1X1/2 BEV (NEEDLE) ×3 IMPLANT
NEEDLE HYPO 25GX1X1/2 BEV (NEEDLE) IMPLANT
NS IRRIG 1000ML POUR BTL (IV SOLUTION) ×12 IMPLANT
PACK LAPAROSCOPY BASIN (CUSTOM PROCEDURE TRAY) IMPLANT
PACK LITHOTOMY IV (CUSTOM PROCEDURE TRAY) ×5 IMPLANT
PACK ORTHO EXTREMITY (CUSTOM PROCEDURE TRAY) ×4 IMPLANT
PACK SURGICAL SETUP 50X90 (CUSTOM PROCEDURE TRAY) ×1 IMPLANT
PAD ABD 7.5X8 STRL (GAUZE/BANDAGES/DRESSINGS) ×1 IMPLANT
PAD ABD 8X10 STRL (GAUZE/BANDAGES/DRESSINGS) ×4 IMPLANT
PAD ARMBOARD 7.5X6 YLW CONV (MISCELLANEOUS) ×10 IMPLANT
PAD CAST 4YDX4 CTTN HI CHSV (CAST SUPPLIES) IMPLANT
PADDING CAST ABS 3INX4YD NS (CAST SUPPLIES) ×1
PADDING CAST ABS 4INX4YD NS (CAST SUPPLIES) ×1
PADDING CAST ABS COTTON 3X4 (CAST SUPPLIES) IMPLANT
PADDING CAST ABS COTTON 4X4 ST (CAST SUPPLIES) IMPLANT
PADDING CAST COTTON 4X4 STRL (CAST SUPPLIES) ×8
PENCIL BUTTON HOLSTER BLD 10FT (ELECTRODE) ×4 IMPLANT
RETRACTOR YANK SUCT EIGR SABER (INSTRUMENTS) ×1 IMPLANT
SET CYSTO W/LG BORE CLAMP LF (SET/KITS/TRAYS/PACK) ×1 IMPLANT
SET HNDPC FAN SPRY TIP SCT (DISPOSABLE) IMPLANT
SHEARS HARMONIC 9CM CVD (BLADE) IMPLANT
SLING ARM IMMOBILIZER XL (CAST SUPPLIES) ×1 IMPLANT
SPECIMEN JAR SMALL (MISCELLANEOUS) IMPLANT
SPLINT PLASTER CAST XFAST 5X30 (CAST SUPPLIES) IMPLANT
SPLINT PLASTER XFAST SET 5X30 (CAST SUPPLIES) ×1
SPONGE LAP 18X18 RF (DISPOSABLE) ×5 IMPLANT
SPONGE LAP 18X18 X RAY DECT (DISPOSABLE) ×1 IMPLANT
SPONGE LAP 4X18 RFD (DISPOSABLE) IMPLANT
SPONGE SURGIFOAM ABS GEL 100 (HEMOSTASIS) ×3 IMPLANT
STOCKINETTE IMPERVIOUS 9X36 MD (GAUZE/BANDAGES/DRESSINGS) IMPLANT
SURGILUBE 2OZ TUBE FLIPTOP (MISCELLANEOUS) ×4 IMPLANT
SUT CHROMIC 3 0 SH 27 (SUTURE) IMPLANT
SUT CHROMIC 4 0 SH 27 (SUTURE) IMPLANT
SUT ETHILON 2 0 FS 18 (SUTURE) ×5 IMPLANT
SUT ETHILON 2 0 PSLX (SUTURE) ×4 IMPLANT
SUT ETHILON 3 0 PS 1 (SUTURE) IMPLANT
SUT PROLENE 3 0 PS 1 (SUTURE) ×4 IMPLANT
SUT VIC AB 3-0 SH 27 (SUTURE)
SUT VIC AB 3-0 SH 27X BRD (SUTURE) IMPLANT
SWAB CULTURE ESWAB REG 1ML (MISCELLANEOUS) IMPLANT
SWAB CULTURE LIQ STUART DBL (MISCELLANEOUS) IMPLANT
SYR CONTROL 10ML LL (SYRINGE) ×3 IMPLANT
TOWEL GREEN STERILE (TOWEL DISPOSABLE) ×8 IMPLANT
TOWEL GREEN STERILE FF (TOWEL DISPOSABLE) ×8 IMPLANT
TUBE CONNECTING 12X1/4 (SUCTIONS) ×9 IMPLANT
TUBE CONNECTING 20X1/4 (TUBING) ×1 IMPLANT
UNDERPAD 30X36 HEAVY ABSORB (UNDERPADS AND DIAPERS) ×8 IMPLANT
WATER STERILE IRR 1000ML POUR (IV SOLUTION) ×4 IMPLANT
YANKAUER SUCT BULB TIP NO VENT (SUCTIONS) ×10 IMPLANT

## 2021-01-18 NOTE — ED Notes (Signed)
Father and step-mother at bedside.

## 2021-01-18 NOTE — ED Notes (Signed)
EMS applied tourniquet 1601 and Dr Viviana Simpler removed tourniquet 1637.

## 2021-01-18 NOTE — ED Notes (Signed)
Patient transported to CT 
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
## 2021-01-18 NOTE — Progress Notes (Signed)
Chaplain, after checking with MD determined family had not been contacted.  Chaplain attempted to call patient's mother, listed, but no answer and voicemail did not have a name associated so Chaplain did not leave a message.  Chaplain alerted SN and Unit secretary incase a return call comes in.  Chaplain available for support, as needed. [place], Mdiv.    01/18/21 1700  Clinical Encounter Type  Visited With Health care provider;Patient not available  Visit Type Critical Care  Referral From Chaplain  Consult/Referral To Chaplain

## 2021-01-18 NOTE — Transfer of Care (Signed)
Immediate Anesthesia Transfer of Care Note  Patient: Kenneth Mcdowell  Procedure(s) Performed: EXAMINATION UNDER ANESTHESIA  RIGID PROCTOSCOPY (N/A Rectum) IRRIGATION AND DEBRIDEMENT LEFT LOWER ARM iNCISIONAL DEBRIDEMENT, CLOSURE OF ELBOW LACERATION.REDUCTION OF DISLOCATION OF LEFT ELBOW (Left Arm Lower) IRRIGATION AND DEBRIDEMNET AND REPAIR OF COMPLEX LACERATION OF LEFT HAND (Left Hand)  Patient Location: ICU  Anesthesia Type:General  Level of Consciousness: sedated and Patient remains intubated per anesthesia plan  Airway & Oxygen Therapy: Patient remains intubated per anesthesia plan and Patient placed on Ventilator (see vital sign flow sheet for setting)  Post-op Assessment: Report given to RN and Post -op Vital signs reviewed and stable  Post vital signs: Reviewed and stable  Last Vitals:  Vitals Value Taken Time  BP 118/91 01/18/21 2047  Temp    Pulse 61 01/18/21 2055  Resp 16 01/18/21 2055  SpO2 86 % 01/18/21 2055  Vitals shown include unvalidated device data.  Last Pain:  Vitals:   01/18/21 1640  TempSrc: Temporal         Complications: No complications documented.   Transported to 4n with full monitors. 02 at 100% bag mask vent.  VSS for transport.  RN and RRT at bedside for handoff.
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
## 2021-01-18 NOTE — Anesthesia Preprocedure Evaluation (Addendum)
Anesthesia Evaluation   Patient unresponsive    Reviewed: Allergy & Precautions, Patient's Chart, lab work & pertinent test results, Unable to perform ROS - Chart review onlyPreop documentation limited or incomplete due to emergent nature of procedure.  History of Anesthesia Complications Negative for: history of anesthetic complications  Airway Mallampati: Intubated       Dental   Pulmonary asthma , COPD,    breath sounds clear to auscultation   + intubated    Cardiovascular  Rhythm:Regular Rate:Tachycardia     Neuro/Psych PSYCHIATRIC DISORDERS Bipolar Disorder  C-spine not cleared    GI/Hepatic (+)     substance abuse  cocaine use, marijuana use, methamphetamine use and IV drug use,   Endo/Other   ?DM - BS >300   Renal/GU Renal InsufficiencyRenal disease     Musculoskeletal  (+) narcotic dependent  Abdominal   Peds  (+) ADHD Hematology   Anesthesia Other Findings LUE partial amputation/open left elbow dislocation Degloving injury to left wrist  L4 burst fx Left T9-10 TVP fxs  Left 9-11 rib fxs with pulm contusion BRBPR Covid test negative     Reproductive/Obstetrics                           Anesthesia Physical Anesthesia Plan  ASA: III and emergent  Anesthesia Plan: General   Post-op Pain Management:    Induction: Inhalational  PONV Risk Score and Plan: 2 and Treatment may vary due to age or medical condition  Airway Management Planned: Oral ETT  Additional Equipment: Arterial line  Intra-op Plan:   Post-operative Plan: Post-operative intubation/ventilation  Informed Consent:     History available from chart only and Only emergency history available  Plan Discussed with: CRNA and Anesthesiologist  Anesthesia Plan Comments:        Anesthesia Quick Evaluation

## 2021-01-18 NOTE — Anesthesia Postprocedure Evaluation (Signed)
Anesthesia Post Note  Patient: TAKUMA CIFELLI  Procedure(s) Performed: EXAMINATION UNDER ANESTHESIA  RIGID PROCTOSCOPY (N/A Rectum) IRRIGATION AND DEBRIDEMENT LEFT LOWER ARM iNCISIONAL DEBRIDEMENT, CLOSURE OF ELBOW LACERATION.REDUCTION OF DISLOCATION OF LEFT ELBOW (Left Arm Lower) IRRIGATION AND DEBRIDEMNET AND REPAIR OF COMPLEX LACERATION OF LEFT HAND (Left Hand)     Patient location during evaluation: ICU Anesthesia Type: General Level of consciousness: sedated and patient remains intubated per anesthesia plan Pain management: pain level controlled Vital Signs Assessment: post-procedure vital signs reviewed and stable Respiratory status: patient remains intubated per anesthesia plan Cardiovascular status: stable Postop Assessment: no apparent nausea or vomiting Anesthetic complications: no   No complications documented.  Last Vitals:  Vitals:   01/18/21 2046 01/18/21 2100  BP:  (!) 154/121  Pulse:  (!) 106  Resp: 15 14  Temp:  (!) 36.3 C  SpO2: 100% 100%    Last Pain:  Vitals:   01/18/21 2100  TempSrc: Axillary                 Audry Pili
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
## 2021-01-18 NOTE — ED Notes (Signed)
2 units of blood administered. Start [place] [id number] Start 1704 End [id number]

## 2021-01-18 NOTE — Op Note (Signed)
PRE-OPERATIVE DIAGNOSIS: blood per rectum in trauma evaluation  POST-OPERATIVE DIAGNOSIS:  Same with grade IV single column prolapsed hemorrhoid  PROCEDURE:  Procedure(s): Exam under anesthesia, rigid proctoscopy, reduction of prolapsed hemorrhoid  SURGEON:  Surgeon(s): Stark Klein, MD  ANESTHESIA:   general  DRAINS: none   LOCAL MEDICATIONS USED:  NONE  SPECIMEN:  No Specimen  DISPOSITION OF SPECIMEN:  N/A  COUNTS:  YES  DICTATION: .Dragon Dictation  PLAN OF CARE: Admit for overnight observation  PATIENT DISPOSITION:  ICU - intubated and critically ill.  FINDINGS:  Single column anterolateral prolapsed internal hemorrhoid  EBL: min  PROCEDURE:  Patient was identified in the operating room where had previously undergone surgery for his upper extremity with Dr. Marlou Sa and Dr. Caralyn Guile.  Patient was placed into low lithotomy position.  The perineum was prepped and draped.  A timeout was performed according to the surgical safety checklist.  When all was correct, we continued.  Immediately visible was a large prolapsed hemorrhoid that was inflamed.  The rectum was examined digitally and then with a small anoscope.  The external hemorrhoid was reduced.  The rigid proctoscopy was then used to examine the rectum.  Insufflation was used to distend the rectum to better be able to see the wall.  There was no evidence of trauma to the rectal wall.  There was a small amount of stool in the rectal vault.  There was no evidence of other pathology.  The hemorrhoid remained reduced.  This was left in place for conservative therapy.  The patient was left intubated and taken to the ICU in stable condition.  Needle, sponge, and instrument counts were correct per protocol.
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
## 2021-01-19 ENCOUNTER — Inpatient Hospital Stay (HOSPITAL_COMMUNITY): Payer: Self-pay | Admitting: Certified Registered Nurse Anesthetist

## 2021-01-19 ENCOUNTER — Inpatient Hospital Stay (HOSPITAL_COMMUNITY): Payer: Self-pay

## 2021-01-19 ENCOUNTER — Encounter (HOSPITAL_COMMUNITY): Payer: Self-pay | Admitting: General Surgery

## 2021-01-19 ENCOUNTER — Other Ambulatory Visit: Payer: Self-pay | Admitting: Neurosurgery

## 2021-01-19 ENCOUNTER — Encounter (HOSPITAL_COMMUNITY): Admission: EM | Disposition: A | Discharge status: Rehabilitation Facility | Payer: Self-pay | Source: Home / Self Care

## 2021-01-19 HISTORY — PX: LUMBAR PERCUTANEOUS PEDICLE SCREW 2 LEVEL: SHX5561

## 2021-01-19 LAB — HIV ANTIBODY (ROUTINE TESTING W REFLEX): HIV Screen 4th Generation wRfx: NONREACTIVE

## 2021-01-19 LAB — POCT I-STAT 7, (LYTES, BLD GAS, ICA,H+H)
Acid-base deficit: 3 mmol/L — ABNORMAL HIGH (ref 0.0–2.0)
Bicarbonate: 22.5 mmol/L (ref 20.0–28.0)
Calcium, Ion: 1.16 mmol/L (ref 1.15–1.40)
HCT: 30 % — ABNORMAL LOW (ref 39.0–52.0)
Hemoglobin: 10.2 g/dL — ABNORMAL LOW (ref 13.0–17.0)
O2 Saturation: 99 %
Patient temperature: 98.9
Potassium: 4.4 mmol/L (ref 3.5–5.1)
Sodium: 141 mmol/L (ref 135–145)
TCO2: 24 mmol/L (ref 22–32)
pCO2 arterial: 41.7 mmHg (ref 32.0–48.0)
pH, Arterial: 7.341 — ABNORMAL LOW (ref 7.350–7.450)
pO2, Arterial: 129 mmHg — ABNORMAL HIGH (ref 83.0–108.0)

## 2021-01-19 LAB — BASIC METABOLIC PANEL
Anion gap: 7 (ref 5–15)
BUN: 12 mg/dL (ref 6–20)
CO2: 22 mmol/L (ref 22–32)
Calcium: 7.6 mg/dL — ABNORMAL LOW (ref 8.9–10.3)
Chloride: 109 mmol/L (ref 98–111)
Creatinine, Ser: 1.37 mg/dL — ABNORMAL HIGH (ref 0.61–1.24)
GFR, Estimated: >60 mL/min (ref >60)
Glucose, Bld: 102 mg/dL — ABNORMAL HIGH (ref 70–99)
Potassium: 4.3 mmol/L (ref 3.5–5.1)
Sodium: 138 mmol/L (ref 135–145)

## 2021-01-19 LAB — RAPID URINE DRUG SCREEN, HOSP PERFORMED
Amphetamines: NOT DETECTED
Barbiturates: NOT DETECTED
Benzodiazepines: POSITIVE — AB
Cocaine: NOT DETECTED
Opiates: NOT DETECTED
Tetrahydrocannabinol: POSITIVE — AB

## 2021-01-19 LAB — CBC
HCT: 36.1 % — ABNORMAL LOW (ref 39.0–52.0)
Hemoglobin: 12.5 g/dL — ABNORMAL LOW (ref 13.0–17.0)
MCH: 31 pg (ref 26.0–34.0)
MCHC: 34.6 g/dL (ref 30.0–36.0)
MCV: 89.6 fL (ref 80.0–100.0)
Platelets: 212 10*3/uL (ref 150–400)
RBC: 4.03 MIL/uL — ABNORMAL LOW (ref 4.22–5.81)
RDW: 14.4 % (ref 11.5–15.5)
WBC: 15.5 10*3/uL — ABNORMAL HIGH (ref 4.0–10.5)
nRBC: 0 % (ref 0.0–0.2)

## 2021-01-19 LAB — TRIGLYCERIDES: Triglycerides: 428 mg/dL — ABNORMAL HIGH (ref <150)

## 2021-01-19 LAB — GLUCOSE, CAPILLARY: Glucose-Capillary: 125 mg/dL — ABNORMAL HIGH (ref 70–99)

## 2021-01-19 LAB — BLOOD PRODUCT ORDER (VERBAL) VERIFICATION

## 2021-01-19 LAB — MRSA PCR SCREENING: MRSA by PCR: NEGATIVE
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
## 2021-01-19 IMAGING — RF DG C-ARM 1-60 MIN
1 series · 9 of 9 positions shown · non-contrast
Comparison: MRI from same day

CLINICAL DATA: Posterior fusion

EXAM:
DG C-ARM 1-60 MIN
FLUOROSCOPY TIME:  Fluoroscopy Time:  1 minutes and 16 seconds
Number of Acquired Spot Images: 9

[Series 1: run · 9 of 9 slices shown]
[im 1/9]
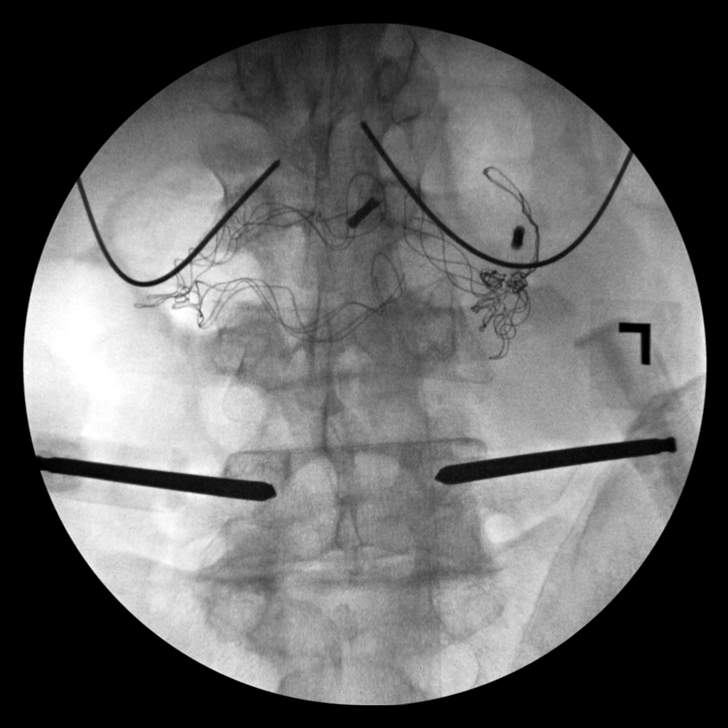
[im 2/9]
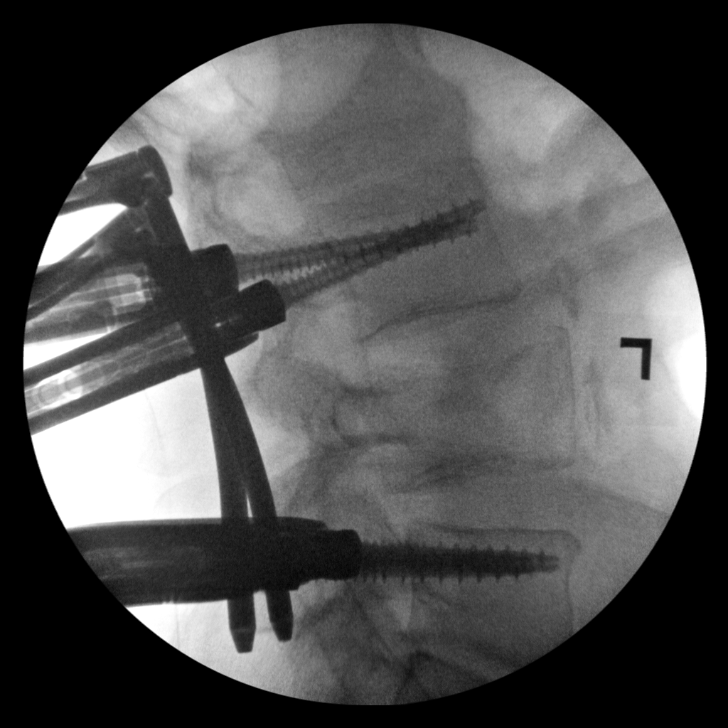
[im 3/9]
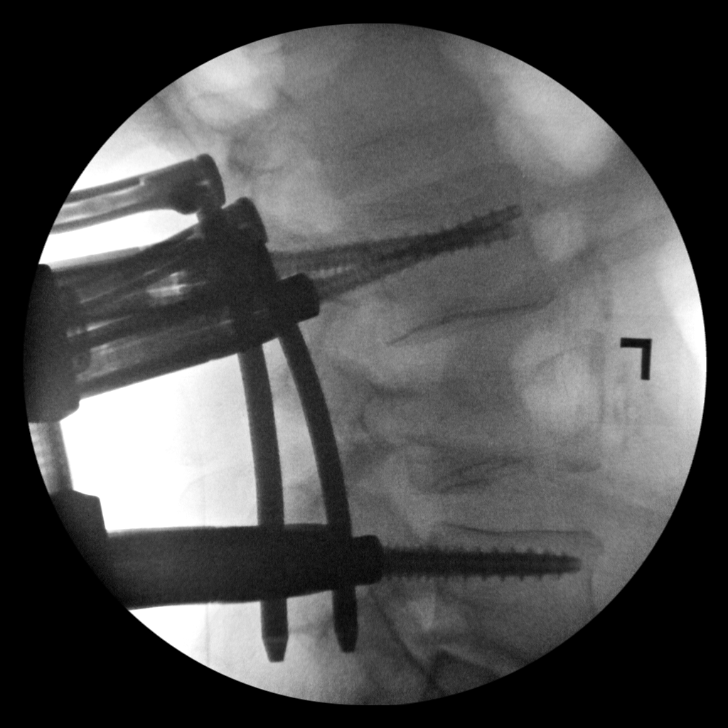
[im 4/9]
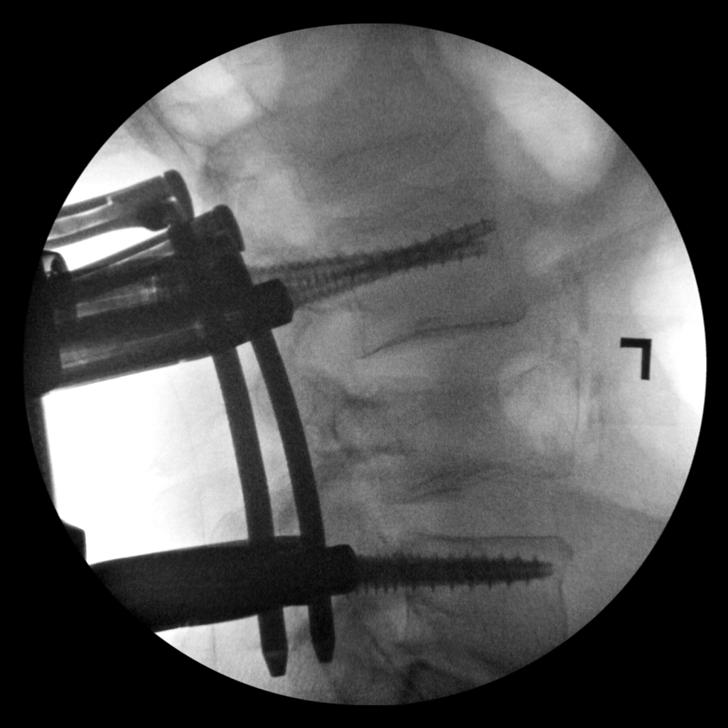
[im 5/9]
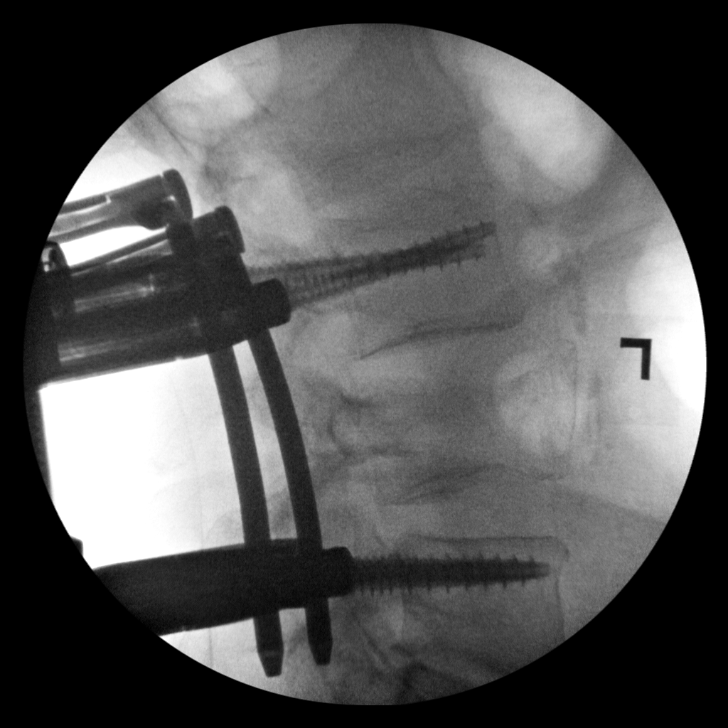
[im 6/9]
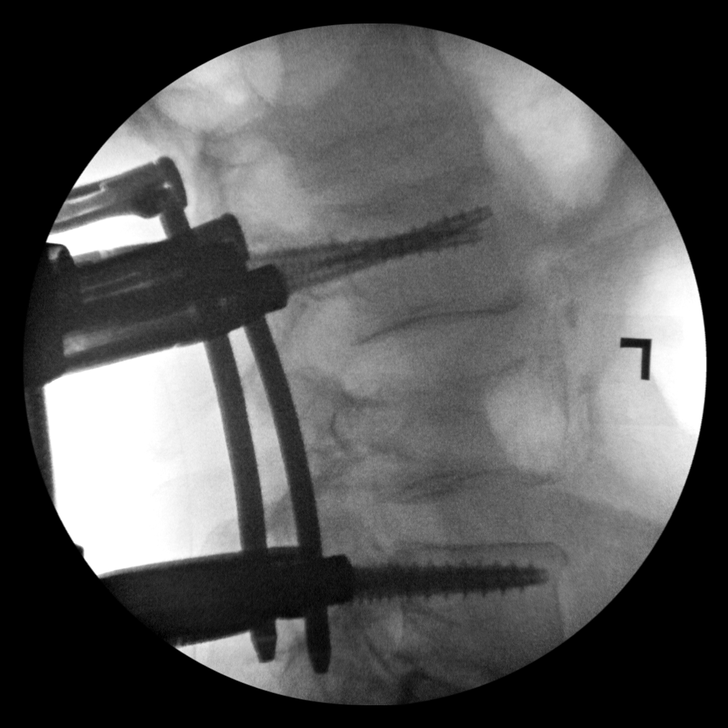
[im 7/9]
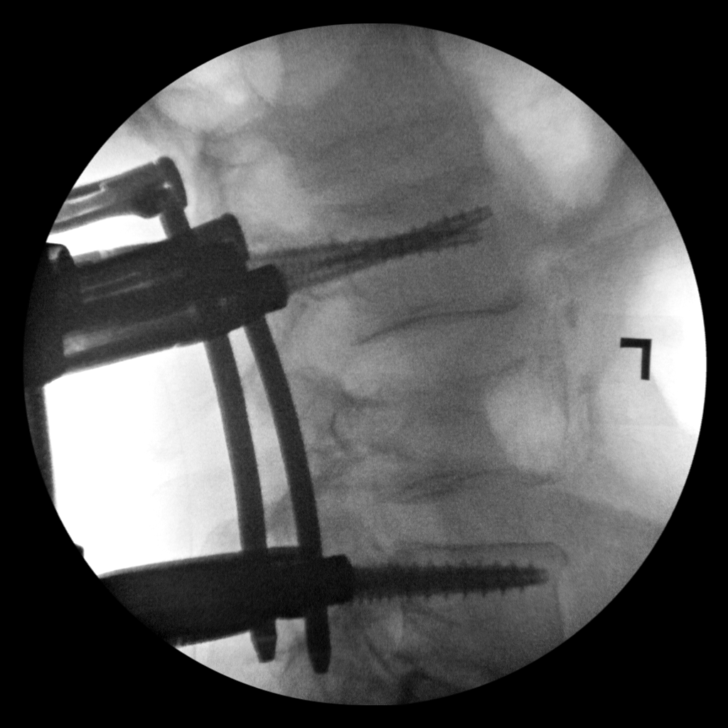
[im 8/9]
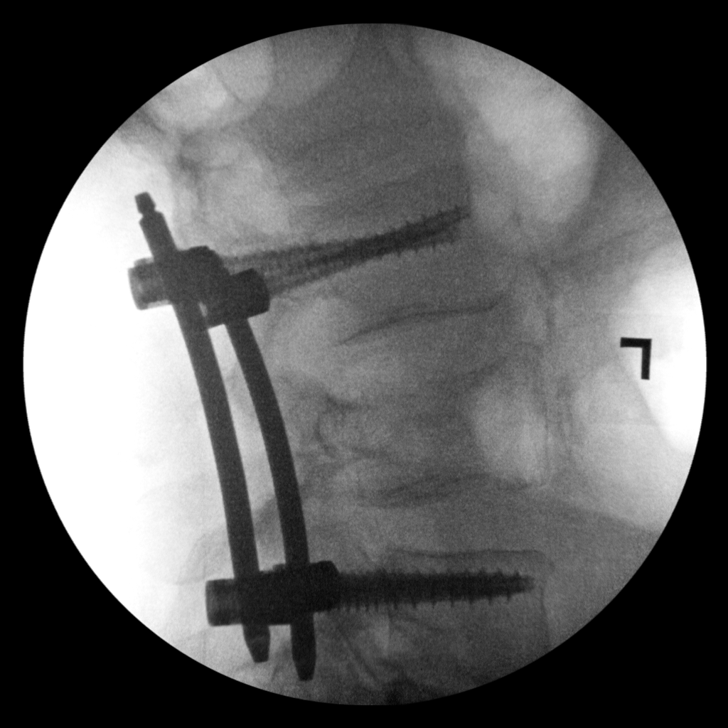
[im 9/9]
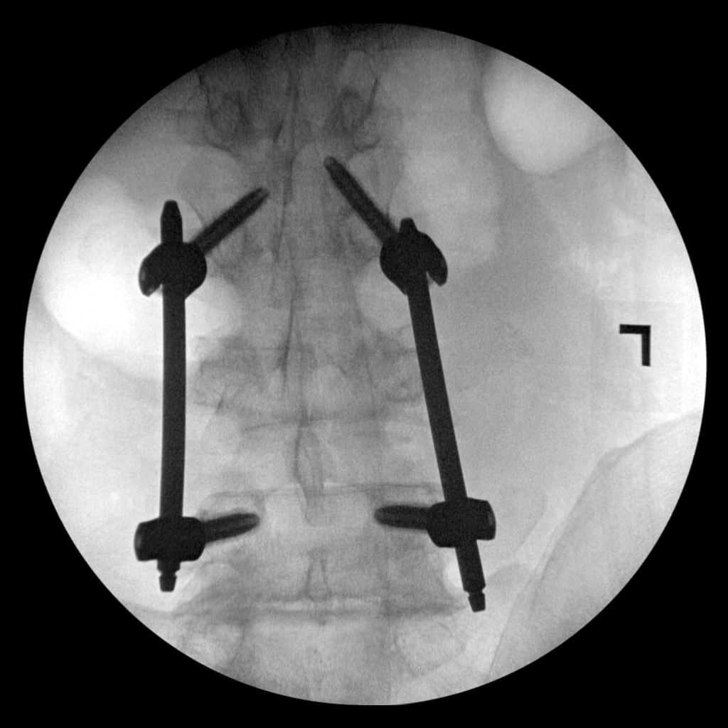

[9 of 9 positions shown; findings below may reference images not displayed]

FINDINGS: The patient has undergone posterior fusion from L3 through L5 based
on the numbering from the prior MRI. Again noted is a burst fracture
of the L4 vertebral body that is better evaluated on the patient's
prior MRI. The hardware appears grossly intact where visualized.
There are expected postsurgical changes.
IMPRESSION: Status post posterior fusion from L3 through L5.

## 2021-01-19 IMAGING — DX DG ELBOW 2V*L*
1 series · 3 of 3 positions shown · non-contrast
Comparison: Intraoperative films from the previous day.

CLINICAL DATA: History of prior dislocation with reduction

EXAM:
LEFT ELBOW - 2 VIEW

[Series 1: elbow · 0.14mm/px · 3 of 3 slices shown]
[im 1/3]
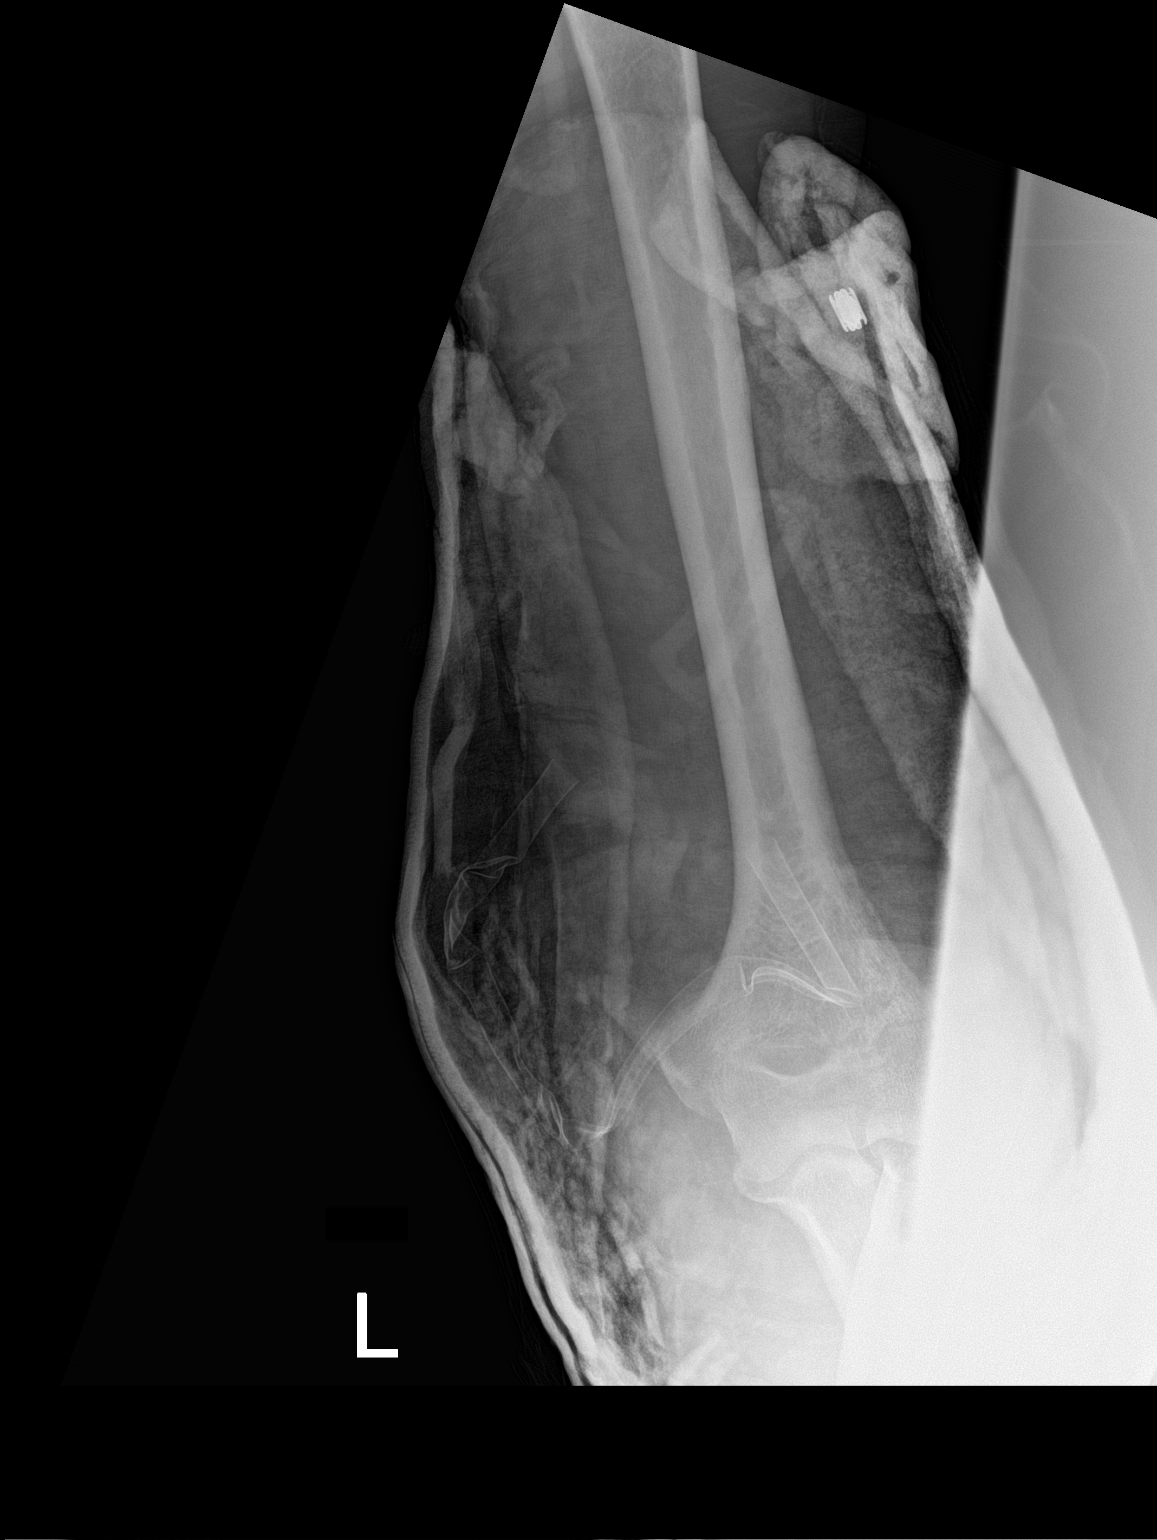
[im 2/3]
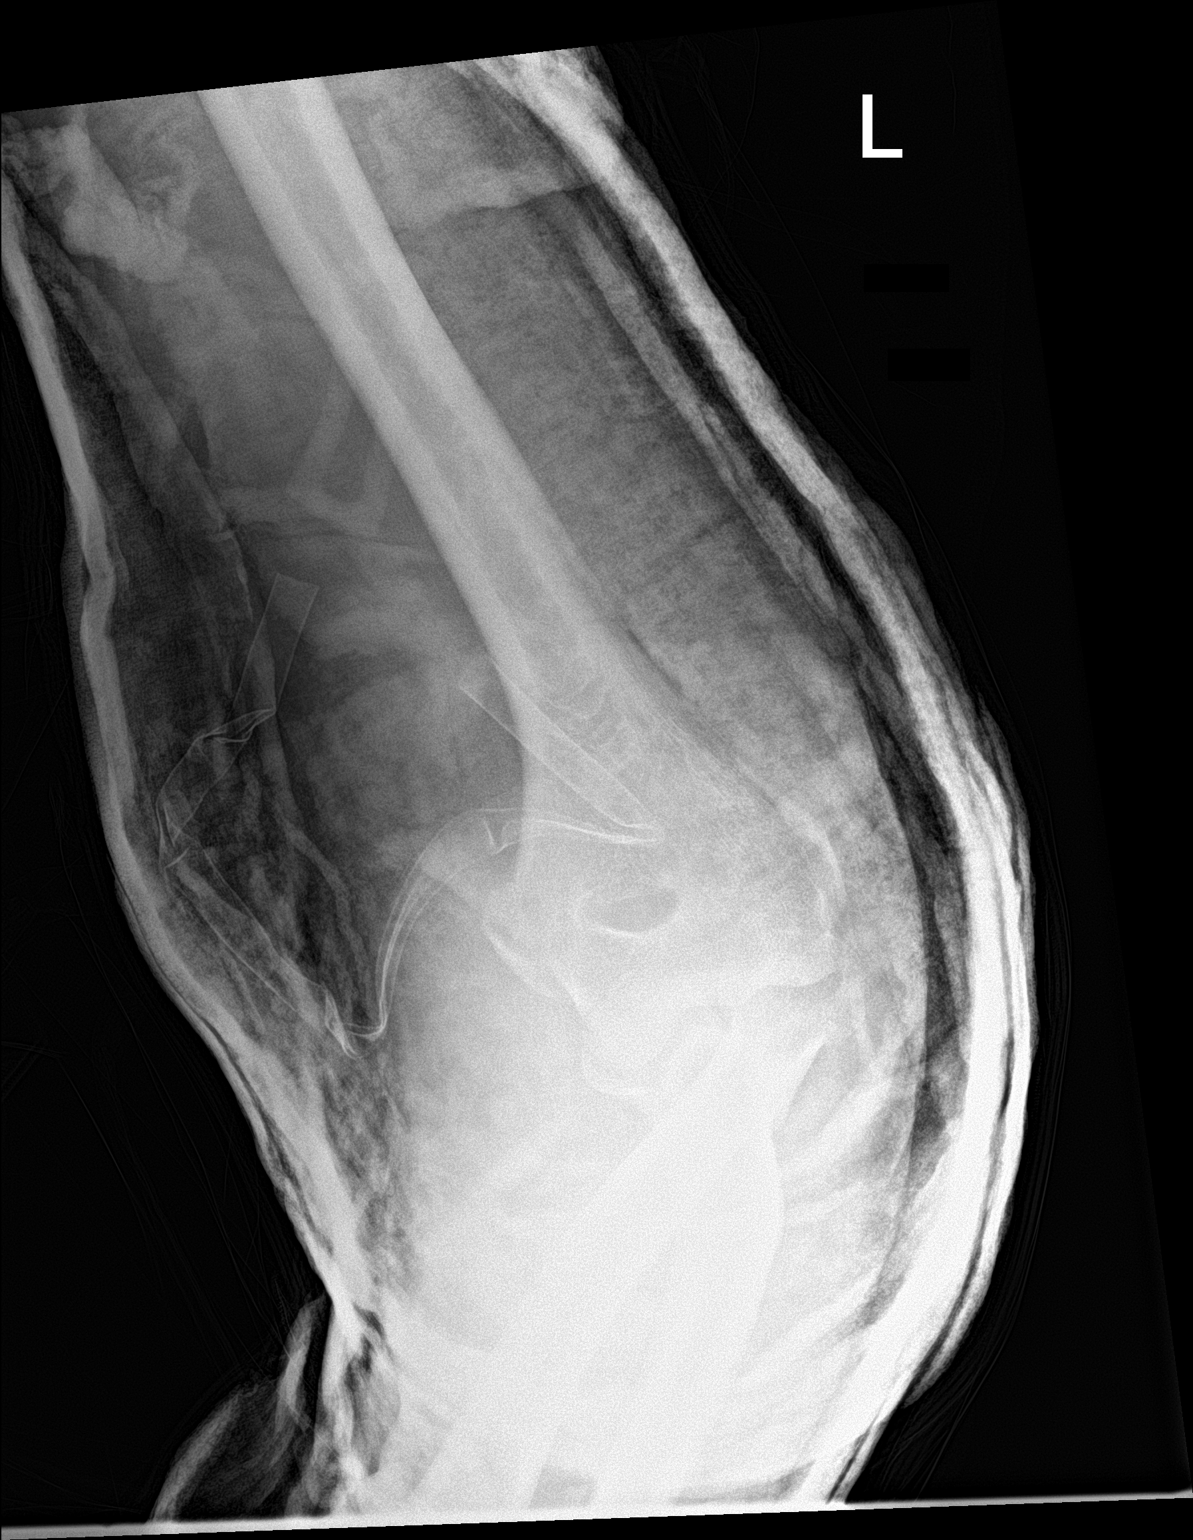
[im 3/3]
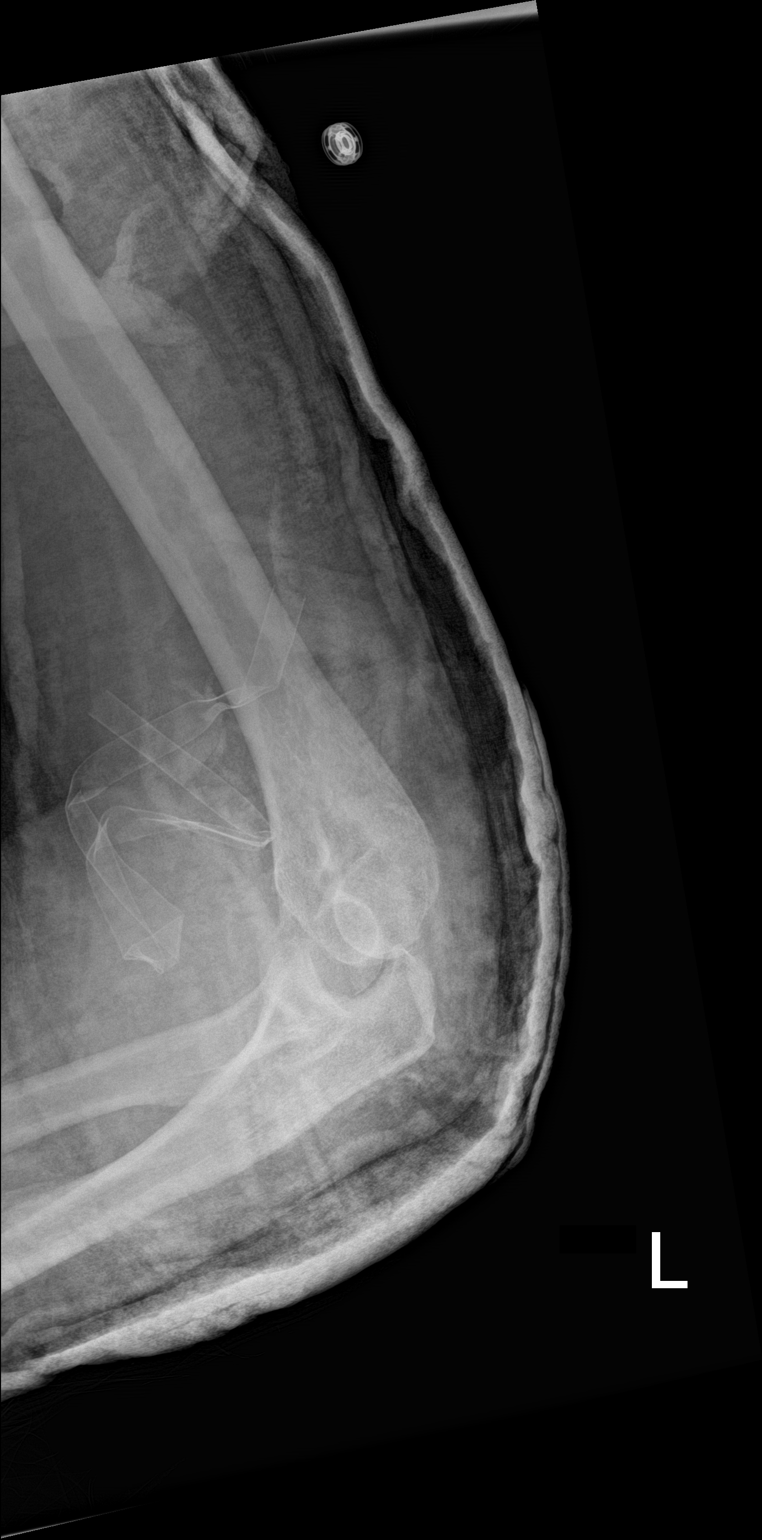

[3 of 3 positions shown; findings below may reference images not displayed]

FINDINGS: Splinting material is noted which limits fine bony detail. No
recurrent dislocation is seen. Surgical drain is noted in place. The
previously seen fracture fragments are not well appreciated on this
exam.
IMPRESSION: Stable reduction following dislocation. The previously seen fracture
fragments are not well visualized.
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
## 2021-01-19 SURGERY — LUMBAR PERCUTANEOUS PEDICLE SCREW 2 LEVEL
Anesthesia: General | Site: Back

## 2021-01-19 MED ORDER — THROMBIN 5000 UNITS EX KIT
PACK | CUTANEOUS | Status: AC
  Filled 2021-01-19: qty 1

## 2021-01-19 MED ORDER — MIDAZOLAM HCL 2 MG/2ML IJ SOLN
INTRAMUSCULAR | Status: AC
  Filled 2021-01-19: qty 2

## 2021-01-19 MED ORDER — 0.9 % SODIUM CHLORIDE (POUR BTL) OPTIME
TOPICAL | Status: DC | PRN
  Administered 2021-01-19: 1000 mL

## 2021-01-19 MED ORDER — PHENYLEPHRINE HCL (PRESSORS) 10 MG/ML IV SOLN
INTRAVENOUS | Status: DC | PRN
  Administered 2021-01-19: 120 ug via INTRAVENOUS

## 2021-01-19 MED ORDER — MIDAZOLAM HCL 2 MG/2ML IJ SOLN
INTRAMUSCULAR | Status: AC
  Filled 2021-01-19: qty 4

## 2021-01-19 MED ORDER — HYDROMORPHONE HCL 1 MG/ML IJ SOLN
INTRAMUSCULAR | Status: AC
  Filled 2021-01-19: qty 0.5

## 2021-01-19 MED ORDER — ROCURONIUM BROMIDE 100 MG/10ML IV SOLN
INTRAVENOUS | Status: DC | PRN
  Administered 2021-01-19: 100 mg via INTRAVENOUS
  Administered 2021-01-19: 50 mg via INTRAVENOUS

## 2021-01-19 MED ORDER — ACETAMINOPHEN 500 MG PO TABS
1000.0000 mg | ORAL_TABLET | Freq: Four times a day (QID) | ORAL | Status: DC
  Administered 2021-01-20 – 2021-01-24 (×13): 1000 mg
  Filled 2021-01-19 (×13): qty 2

## 2021-01-19 MED ORDER — ALBUMIN HUMAN 5 % IV SOLN
25.0000 g | Freq: Once | INTRAVENOUS | Status: AC
  Administered 2021-01-19: 25 g via INTRAVENOUS
  Filled 2021-01-19: qty 500

## 2021-01-19 MED ORDER — SODIUM CHLORIDE 0.9 % IV SOLN
2.0000 g | Freq: Every day | INTRAVENOUS | Status: DC
  Administered 2021-01-19 – 2021-01-20 (×2): 2 g via INTRAVENOUS
  Filled 2021-01-19 (×2): qty 2

## 2021-01-19 MED ORDER — THROMBIN 5000 UNITS EX SOLR
OROMUCOSAL | Status: DC | PRN
  Administered 2021-01-19: 5 mL via TOPICAL

## 2021-01-19 MED ORDER — MIDAZOLAM HCL 5 MG/5ML IJ SOLN
INTRAMUSCULAR | Status: DC | PRN
  Administered 2021-01-19: 2 mg via INTRAVENOUS
  Administered 2021-01-19: 4 mg via INTRAVENOUS

## 2021-01-19 MED ORDER — FENTANYL CITRATE (PF) 250 MCG/5ML IJ SOLN
INTRAMUSCULAR | Status: AC
  Filled 2021-01-19: qty 5

## 2021-01-19 MED ORDER — LIDOCAINE-EPINEPHRINE 1 %-1:100000 IJ SOLN
INTRAMUSCULAR | Status: AC
  Filled 2021-01-19: qty 1

## 2021-01-19 MED ORDER — THROMBIN (RECOMBINANT) 5000 UNITS EX SOLR
CUTANEOUS | Status: AC
  Filled 2021-01-19: qty 5000

## 2021-01-19 MED ORDER — OXYCODONE HCL 5 MG/5ML PO SOLN
10.0000 mg | ORAL | Status: DC | PRN
Start: 2021-01-19 — End: 2021-01-23
  Administered 2021-01-20 – 2021-01-23 (×6): 10 mg
  Filled 2021-01-19 (×6): qty 10

## 2021-01-19 MED ORDER — PANTOPRAZOLE SODIUM 40 MG PO PACK
40.0000 mg | PACK | Freq: Every day | ORAL | Status: DC
  Administered 2021-01-21 – 2021-01-22 (×2): 40 mg
  Filled 2021-01-19 (×2): qty 20

## 2021-01-19 MED ORDER — PROPOFOL 10 MG/ML IV BOLUS
INTRAVENOUS | Status: DC | PRN
  Administered 2021-01-19: 100 mg via INTRAVENOUS

## 2021-01-19 MED ORDER — PROPOFOL 10 MG/ML IV BOLUS
INTRAVENOUS | Status: AC
  Filled 2021-01-19: qty 20

## 2021-01-19 MED ORDER — DEXAMETHASONE SODIUM PHOSPHATE 10 MG/ML IJ SOLN
INTRAMUSCULAR | Status: DC | PRN
  Administered 2021-01-19: 10 mg via INTRAVENOUS

## 2021-01-19 MED ORDER — CEFAZOLIN SODIUM-DEXTROSE 2-3 GM-%(50ML) IV SOLR
INTRAVENOUS | Status: DC | PRN
  Administered 2021-01-19: 2 g via INTRAVENOUS

## 2021-01-19 MED ORDER — LACTATED RINGERS IV SOLN: INTRAVENOUS | Status: DC | PRN

## 2021-01-19 MED ORDER — CEFAZOLIN SODIUM 1 G IJ SOLR
INTRAMUSCULAR | Status: AC
  Filled 2021-01-19: qty 20

## 2021-01-19 MED ORDER — DEXMEDETOMIDINE (PRECEDEX) IN NS 20 MCG/5ML (4 MCG/ML) IV SYRINGE
PREFILLED_SYRINGE | INTRAVENOUS | Status: DC | PRN
  Administered 2021-01-19 (×2): 20 ug via INTRAVENOUS

## 2021-01-19 MED ORDER — FENTANYL CITRATE (PF) 100 MCG/2ML IJ SOLN
INTRAMUSCULAR | Status: DC | PRN
  Administered 2021-01-19: 100 ug via INTRAVENOUS
  Administered 2021-01-19: 150 ug via INTRAVENOUS
  Administered 2021-01-19: 100 ug via INTRAVENOUS
  Administered 2021-01-19: 50 ug via INTRAVENOUS
  Administered 2021-01-19: 100 ug via INTRAVENOUS

## 2021-01-19 MED ORDER — HYDROMORPHONE HCL 1 MG/ML IJ SOLN
INTRAMUSCULAR | Status: DC | PRN
  Administered 2021-01-19: .5 mg via INTRAVENOUS

## 2021-01-19 MED ORDER — LIDOCAINE-EPINEPHRINE 1 %-1:100000 IJ SOLN
INTRAMUSCULAR | Status: DC | PRN
  Administered 2021-01-19: 10 mL via INTRADERMAL

## 2021-01-19 SURGICAL SUPPLY — 55 items
ADH SKN CLS APL DERMABOND .7 (GAUZE/BANDAGES/DRESSINGS) ×1
BAND INSRT 18 STRL LF DISP RB (MISCELLANEOUS) ×2
BAND RUBBER #18 3X1/16 STRL (MISCELLANEOUS) ×4 IMPLANT
BLADE CLIPPER SURG (BLADE) IMPLANT
BUR CARBIDE MATCH 3.0 (BURR) ×1 IMPLANT
CANISTER SUCT 3000ML PPV (MISCELLANEOUS) ×2 IMPLANT
COVER WAND RF STERILE (DRAPES) ×2 IMPLANT
DECANTER SPIKE VIAL GLASS SM (MISCELLANEOUS) ×2 IMPLANT
DERMABOND ADVANCED (GAUZE/BANDAGES/DRESSINGS) ×1
DERMABOND ADVANCED .7 DNX12 (GAUZE/BANDAGES/DRESSINGS) IMPLANT
DRAPE C-ARM 42X72 X-RAY (DRAPES) ×4 IMPLANT
DRAPE LAPAROTOMY 100X72X124 (DRAPES) ×2 IMPLANT
DRAPE SURG 17X23 STRL (DRAPES) ×2 IMPLANT
DURAPREP 26ML APPLICATOR (WOUND CARE) ×2 IMPLANT
ELECT BLADE INSULATED 6.5IN (ELECTROSURGICAL) ×2
ELECT REM PT RETURN 9FT ADLT (ELECTROSURGICAL) ×2
ELECTRODE BLDE INSULATED 6.5IN (ELECTROSURGICAL) ×1 IMPLANT
ELECTRODE REM PT RTRN 9FT ADLT (ELECTROSURGICAL) ×1 IMPLANT
GAUZE 4X4 16PLY RFD (DISPOSABLE) ×1 IMPLANT
GAUZE SPONGE 4X4 12PLY STRL (GAUZE/BANDAGES/DRESSINGS) ×1 IMPLANT
GLOVE BIOGEL PI IND STRL 7.5 (GLOVE) ×1 IMPLANT
GLOVE BIOGEL PI INDICATOR 7.5 (GLOVE) ×1
GLOVE ECLIPSE 7.5 STRL STRAW (GLOVE) ×2 IMPLANT
GOWN STRL REUS W/ TWL LRG LVL3 (GOWN DISPOSABLE) ×2 IMPLANT
GOWN STRL REUS W/ TWL XL LVL3 (GOWN DISPOSABLE) ×1 IMPLANT
GOWN STRL REUS W/TWL 2XL LVL3 (GOWN DISPOSABLE) IMPLANT
GOWN STRL REUS W/TWL LRG LVL3 (GOWN DISPOSABLE) ×4
GOWN STRL REUS W/TWL XL LVL3 (GOWN DISPOSABLE) ×2
GUIDEWIRE SHARP NITINOL 610 (WIRE) ×4 IMPLANT
HEMOSTAT POWDER KIT SURGIFOAM (HEMOSTASIS) ×2 IMPLANT
KIT BASIN OR (CUSTOM PROCEDURE TRAY) ×2 IMPLANT
KIT TURNOVER KIT B (KITS) ×2 IMPLANT
NDL ASPIRATION RAN815N 8X15 (NEEDLE) IMPLANT
NDL HYPO 18GX1.5 BLUNT FILL (NEEDLE) IMPLANT
NEEDLE ASPIRATION RAN815N 8X15 (NEEDLE) ×2 IMPLANT
NEEDLE ASPIRATION RANFAC 8X15 (NEEDLE) ×2
NEEDLE HYPO 18GX1.5 BLUNT FILL (NEEDLE) IMPLANT
NEEDLE HYPO 22GX1.5 SAFETY (NEEDLE) ×2 IMPLANT
NS IRRIG 1000ML POUR BTL (IV SOLUTION) ×2 IMPLANT
PACK LAMINECTOMY NEURO (CUSTOM PROCEDURE TRAY) ×2 IMPLANT
PAD ARMBOARD 7.5X6 YLW CONV (MISCELLANEOUS) ×6 IMPLANT
ROD PERC STRT 5.5X90 (Rod) ×4 IMPLANT
ROD PERC STRT 5.5X90 NS (Rod) IMPLANT
SCREW CANN SOLERA 6.5X55 (Screw) ×2 IMPLANT
SCREW CANN SOLERA 7.5X50 (Screw) ×2 IMPLANT
SET SCREW SPINAL 5.5X6 TI (Screw) ×4 IMPLANT
SPONGE LAP 4X18 RFD (DISPOSABLE) IMPLANT
SPONGE SURGIFOAM ABS GEL SZ50 (HEMOSTASIS) ×1 IMPLANT
SUT MNCRL AB 4-0 PS2 18 (SUTURE) ×3 IMPLANT
SUT VIC AB 0 CT1 18XCR BRD8 (SUTURE) IMPLANT
SUT VIC AB 0 CT1 8-18 (SUTURE) ×4
SUT VIC AB 2-0 CP2 18 (SUTURE) ×4 IMPLANT
TOWEL GREEN STERILE (TOWEL DISPOSABLE) ×2 IMPLANT
TOWEL GREEN STERILE FF (TOWEL DISPOSABLE) ×2 IMPLANT
WATER STERILE IRR 1000ML POUR (IV SOLUTION) ×2 IMPLANT

## 2021-01-19 NOTE — Progress Notes (Signed)
Orthopedic Tech Progress Note Patient Details:  Kenneth Mcdowell 1966/08/14 [id number] Custom Clamshell LSO brace has been ordered Patient ID: Kenneth Mcdowell, male   DOB: 10/14/1966, 55 y.o.   MRN: [id number]   Kenneth Mcdowell 01/19/2021, 10:13 PM

## 2021-01-19 NOTE — Anesthesia Preprocedure Evaluation (Signed)
Anesthesia Evaluation  Patient identified by MRN, date of birth, ID band Patient unresponsive    Reviewed: Allergy & Precautions, Patient's Chart, lab work & pertinent test results, Unable to perform ROS - Chart review onlyPreop documentation limited or incomplete due to emergent nature of procedure.  History of Anesthesia Complications Negative for: history of anesthetic complications  Airway Mallampati: Intubated       Dental   Pulmonary asthma , COPD,    breath sounds clear to auscultation   + intubated    Cardiovascular  Rhythm:Regular Rate:Tachycardia     Neuro/Psych PSYCHIATRIC DISORDERS Bipolar Disorder  C-spine not cleared    GI/Hepatic (+)     substance abuse  cocaine use, marijuana use, methamphetamine use and IV drug use,   Endo/Other   ?DM - BS >300   Renal/GU Renal InsufficiencyRenal disease     Musculoskeletal  (+) narcotic dependent  Abdominal   Peds  (+) ADHD Hematology   Anesthesia Other Findings LUE partial amputation/open left elbow dislocation Degloving injury to left wrist  L4 burst fx Left T9-10 TVP fxs  Left 9-11 rib fxs with pulm contusion BRBPR Covid test negative     Reproductive/Obstetrics                             Lab Results  Component Value Date   WBC 15.5 (H) 01/19/2021   HGB 10.2 (L) 01/19/2021   HCT 30.0 (L) 01/19/2021   MCV 89.6 01/19/2021   PLT 212 01/19/2021   Lab Results  Component Value Date   CREATININE 1.37 (H) 01/19/2021   BUN 12 01/19/2021   NA 141 01/19/2021   K 4.4 01/19/2021   CL 109 01/19/2021   CO2 22 01/19/2021    Anesthesia Physical  Anesthesia Plan  ASA: IV  Anesthesia Plan: General   Post-op Pain Management:    Induction: Inhalational  PONV Risk Score and Plan: 2 and Treatment may vary due to age or medical condition  Airway Management Planned: Oral ETT  Additional Equipment: Arterial line  Intra-op  Plan:   Post-operative Plan: Post-operative intubation/ventilation  Informed Consent: I have reviewed the patients History and Physical, chart, labs and discussed the procedure including the risks, benefits and alternatives for the proposed anesthesia with the patient or authorized representative who has indicated his/her understanding and acceptance.     History available from chart only and Only emergency history available  Plan Discussed with: CRNA and Anesthesiologist  Anesthesia Plan Comments:         Anesthesia Quick Evaluation
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
## 2021-01-19 NOTE — Anesthesia Postprocedure Evaluation (Signed)
Anesthesia Post Note  Patient: Kenneth Mcdowell  Procedure(s) Performed: LUMBAR TWO - LUMBAR FOUR POSTERIOR PERCUTANEOUS INSTRUMENTATION WITH REDUCTION OF FRACTURE (N/A Back)     Patient location during evaluation: SICU Anesthesia Type: General Level of consciousness: sedated Pain management: pain level controlled Vital Signs Assessment: post-procedure vital signs reviewed and stable Respiratory status: patient remains intubated per anesthesia plan Cardiovascular status: stable Postop Assessment: no apparent nausea or vomiting Anesthetic complications: no   No complications documented.  Last Vitals:  Vitals:   01/19/21 1923 01/19/21 2312  BP:    Pulse: (!) 111 96  Resp: 18 16  Temp:    SpO2: 100% 99%    Last Pain:  Vitals:   01/19/21 1600  TempSrc: Axillary                 Tiajuana Amass
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
## 2021-01-19 NOTE — TOC CAGE-AID Note (Signed)
Transition of Care [facility]) - CAGE-AID Screening   Patient Details  Name: KINSLER SOEDER MRN: [id number] Date of Birth: [birth year]   Dia Crawford, RN Phone Number: 01/19/2021, 9:42 AM   Clinical Narrative:  Pt unable to participate, intubated   CAGE-AID Screening: Substance Abuse Screening unable to be completed due to: : Patient unable to participate             Substance Abuse Education Offered:  (Pt intubated)
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
## 2021-01-19 NOTE — Transfer of Care (Signed)
Immediate Anesthesia Transfer of Care Note  Patient: Kenneth Mcdowell  Procedure(s) Performed: LUMBAR TWO - LUMBAR FOUR POSTERIOR PERCUTANEOUS INSTRUMENTATION WITH REDUCTION OF FRACTURE (N/A Back)  Patient Location: ICU  Anesthesia Type:General  Level of Consciousness: Patient remains intubated per anesthesia plan  Airway & Oxygen Therapy: Patient remains intubated per anesthesia plan and Patient placed on Ventilator (see vital sign flow sheet for setting)  Post-op Assessment: Report given to RN and Post -op Vital signs reviewed and stable  Post vital signs: Reviewed and stable  Last Vitals:  Vitals Value Taken Time  BP 104/69 01/19/21 2215  Temp    Pulse 95 01/19/21 2216  Resp 16 01/19/21 2216  SpO2 100 % 01/19/21 2216  Vitals shown include unvalidated device data.  Last Pain:  Vitals:   01/19/21 1600  TempSrc: Axillary         Complications: No complications documented.

## 2021-01-19 NOTE — Progress Notes (Addendum)
Neurosurgery  I discussed the planned procedure of L3-L5 posterior percutaneous instrumented fusion with reduction of burst fracture with the patient's next of kin, his mother Mellody Life, via cell phone at [phone]..  I reviewed the basic technique of the surgery, risks, benefits, alternatives, expected convalescence.  Risks discussed included, but were not limited to, bleeding, pain, infection, spinal fluid leak, progressive fracture, failure to heal, neurologic deficit, and death.  However, she understands that without surgery, he would not be able to be mobilized out of bed.  All questions and concerns were answered.

## 2021-01-20 ENCOUNTER — Inpatient Hospital Stay (HOSPITAL_COMMUNITY): Payer: Self-pay

## 2021-01-20 ENCOUNTER — Encounter (HOSPITAL_COMMUNITY): Admission: EM | Disposition: A | Discharge status: Rehabilitation Facility | Payer: Self-pay | Source: Home / Self Care

## 2021-01-20 ENCOUNTER — Inpatient Hospital Stay (HOSPITAL_COMMUNITY): Payer: Self-pay | Admitting: Anesthesiology

## 2021-01-20 DIAGNOSIS — S32001A Stable burst fracture of unspecified lumbar vertebra, initial encounter for closed fracture: Secondary | ICD-10-CM

## 2021-01-20 DIAGNOSIS — S46212A Strain of muscle, fascia and tendon of other parts of biceps, left arm, initial encounter: Secondary | ICD-10-CM

## 2021-01-20 DIAGNOSIS — S2249XA Multiple fractures of ribs, unspecified side, initial encounter for closed fracture: Secondary | ICD-10-CM

## 2021-01-20 DIAGNOSIS — S27329A Contusion of lung, unspecified, initial encounter: Secondary | ICD-10-CM

## 2021-01-20 HISTORY — PX: DISTAL BICEPS TENDON REPAIR: SHX1461

## 2021-01-20 HISTORY — PX: I & D EXTREMITY: SHX5045

## 2021-01-20 HISTORY — DX: Multiple fractures of ribs, unspecified side, initial encounter for closed fracture: S22.49XA

## 2021-01-20 HISTORY — DX: Strain of muscle, fascia and tendon of other parts of biceps, left arm, initial encounter: S46.212A

## 2021-01-20 HISTORY — DX: Stable burst fracture of unspecified lumbar vertebra, initial encounter for closed fracture: S32.001A

## 2021-01-20 HISTORY — DX: Contusion of lung, unspecified, initial encounter: S27.329A

## 2021-01-20 LAB — BASIC METABOLIC PANEL
Anion gap: 6 (ref 5–15)
BUN: 15 mg/dL (ref 6–20)
CO2: 23 mmol/L (ref 22–32)
Calcium: 8.2 mg/dL — ABNORMAL LOW (ref 8.9–10.3)
Chloride: 108 mmol/L (ref 98–111)
Creatinine, Ser: 1.27 mg/dL — ABNORMAL HIGH (ref 0.61–1.24)
GFR, Estimated: >60 mL/min (ref >60)
Glucose, Bld: 141 mg/dL — ABNORMAL HIGH (ref 70–99)
Potassium: 4.4 mmol/L (ref 3.5–5.1)
Sodium: 137 mmol/L (ref 135–145)

## 2021-01-20 LAB — POCT I-STAT 7, (LYTES, BLD GAS, ICA,H+H)
Acid-base deficit: 1 mmol/L (ref 0.0–2.0)
Bicarbonate: 23.8 mmol/L (ref 20.0–28.0)
Calcium, Ion: 1.17 mmol/L (ref 1.15–1.40)
HCT: 22 % — ABNORMAL LOW (ref 39.0–52.0)
Hemoglobin: 7.5 g/dL — ABNORMAL LOW (ref 13.0–17.0)
O2 Saturation: 99 %
Patient temperature: 100.1
Potassium: 4.3 mmol/L (ref 3.5–5.1)
Sodium: 137 mmol/L (ref 135–145)
TCO2: 25 mmol/L (ref 22–32)
pCO2 arterial: 40.1 mmHg (ref 32.0–48.0)
pH, Arterial: 7.385 (ref 7.350–7.450)
pO2, Arterial: 162 mmHg — ABNORMAL HIGH (ref 83.0–108.0)

## 2021-01-20 LAB — CBC
HCT: 25.4 % — ABNORMAL LOW (ref 39.0–52.0)
Hemoglobin: 8.3 g/dL — ABNORMAL LOW (ref 13.0–17.0)
MCH: 29.5 pg (ref 26.0–34.0)
MCHC: 32.7 g/dL (ref 30.0–36.0)
MCV: 90.4 fL (ref 80.0–100.0)
Platelets: 143 10*3/uL — ABNORMAL LOW (ref 150–400)
RBC: 2.81 MIL/uL — ABNORMAL LOW (ref 4.22–5.81)
RDW: 14 % (ref 11.5–15.5)
WBC: 10.3 10*3/uL (ref 4.0–10.5)
nRBC: 0 % (ref 0.0–0.2)

## 2021-01-20 LAB — TRIGLYCERIDES: Triglycerides: 130 mg/dL (ref <150)

## 2021-01-20 IMAGING — DX DG ABD PORTABLE 1V
1 series · 1 of 1 positions shown · non-contrast
Comparison: [DATE]

CLINICAL DATA: Feeding tube placement

EXAM:
PORTABLE ABDOMEN - 1 VIEW

[abdomen]
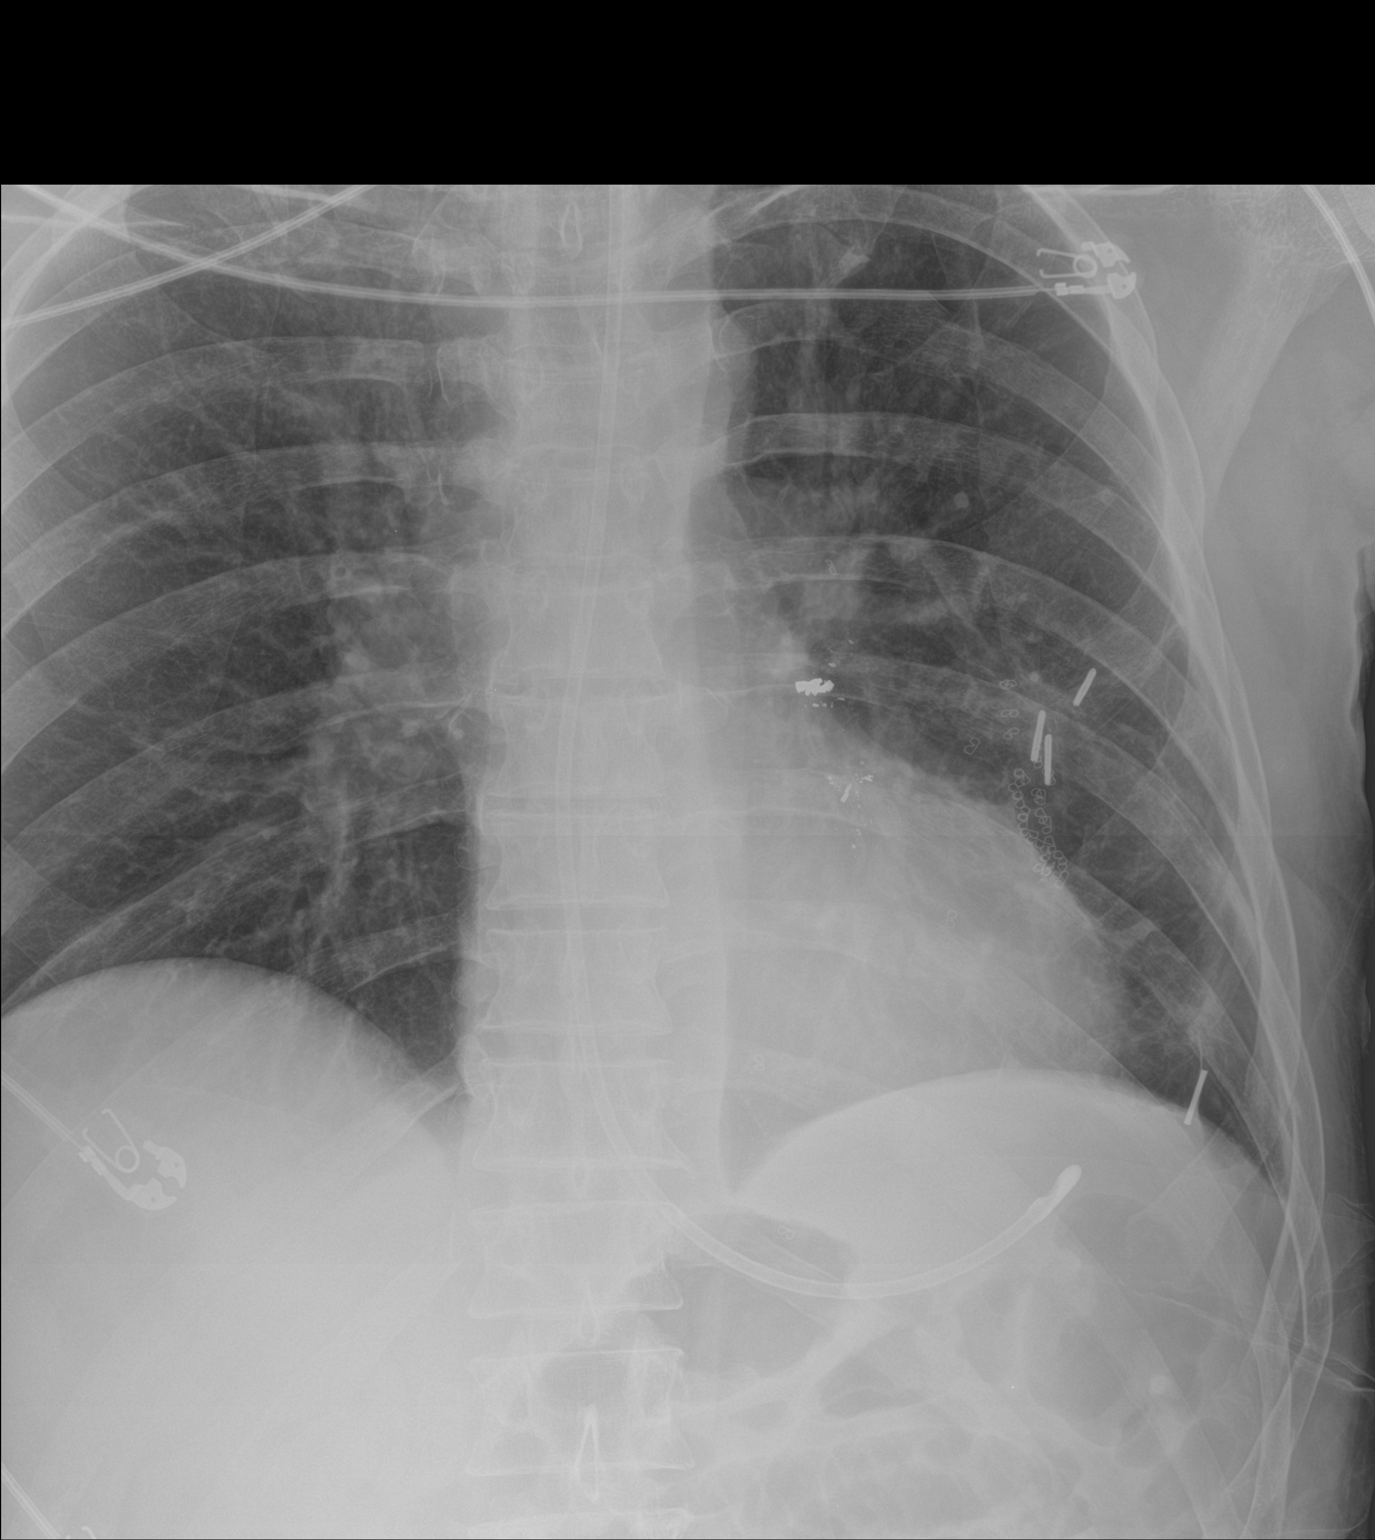

[1 of 1 positions shown; findings below may reference images not displayed]

FINDINGS: Feeding tube tip is in the body of the stomach. Visualized bowel gas
pattern unremarkable. No obstruction or free air appreciable.
Postoperative change noted in left lung.
IMPRESSION: Feeding tube tip in body of stomach. Visualized bowel gas pattern
unremarkable.

## 2021-01-20 IMAGING — DX DG ELBOW 2V*L*
1 series · 2 of 2 positions shown · non-contrast
Comparison: [DATE], [DATE]

CLINICAL DATA: Left elbow fracture dislocation

EXAM:
LEFT ELBOW - 2 VIEW

[Series 1: elbow · 0.14mm/px · 2 of 2 slices shown]
[im 1/2]
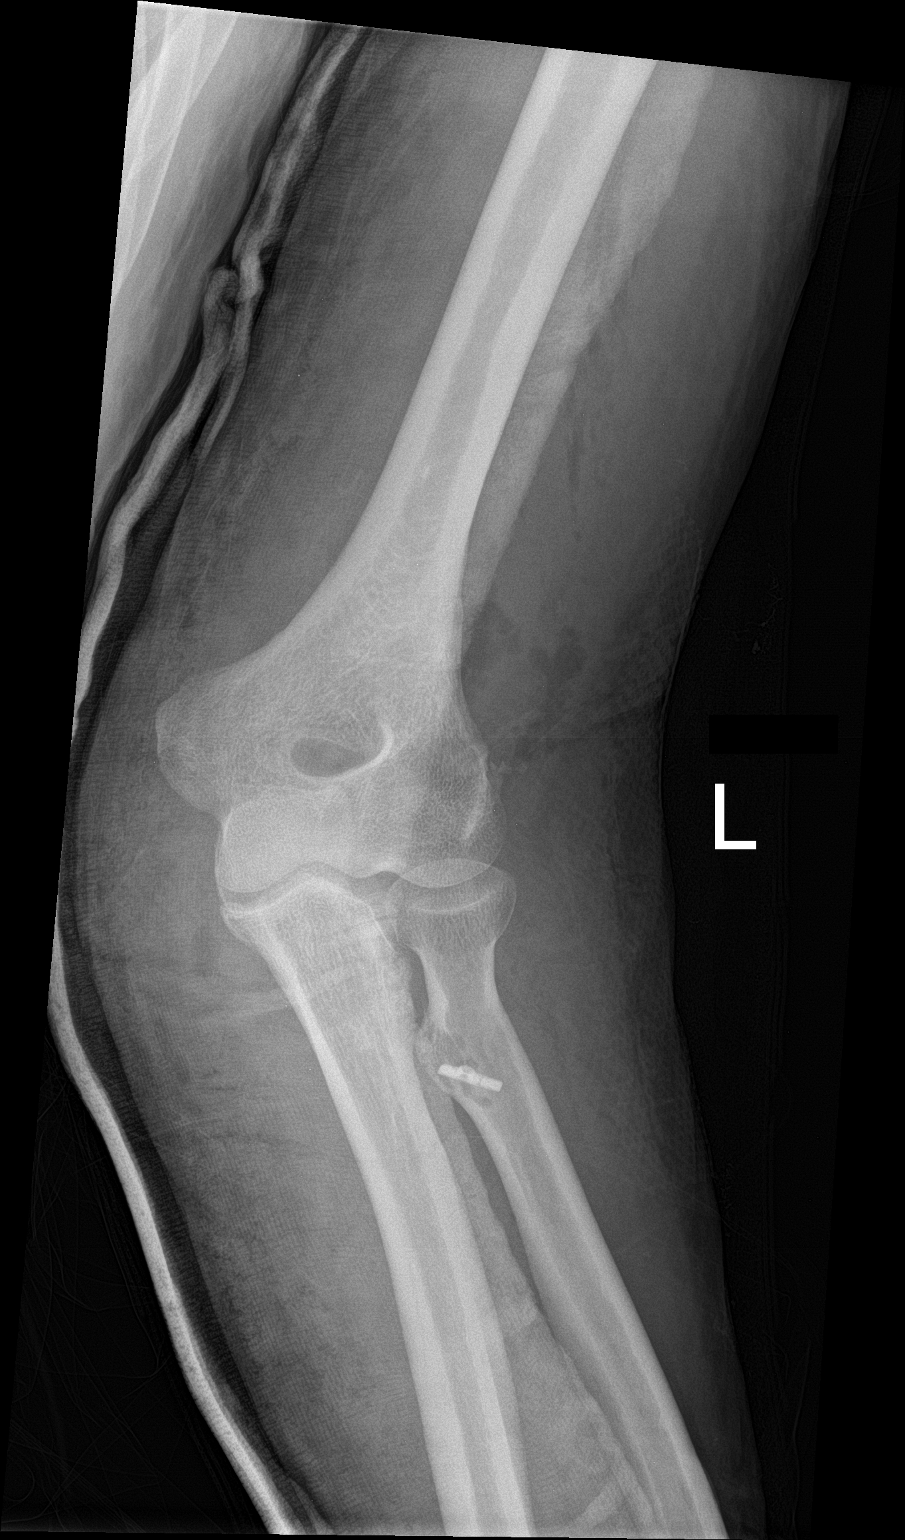
[im 2/2]
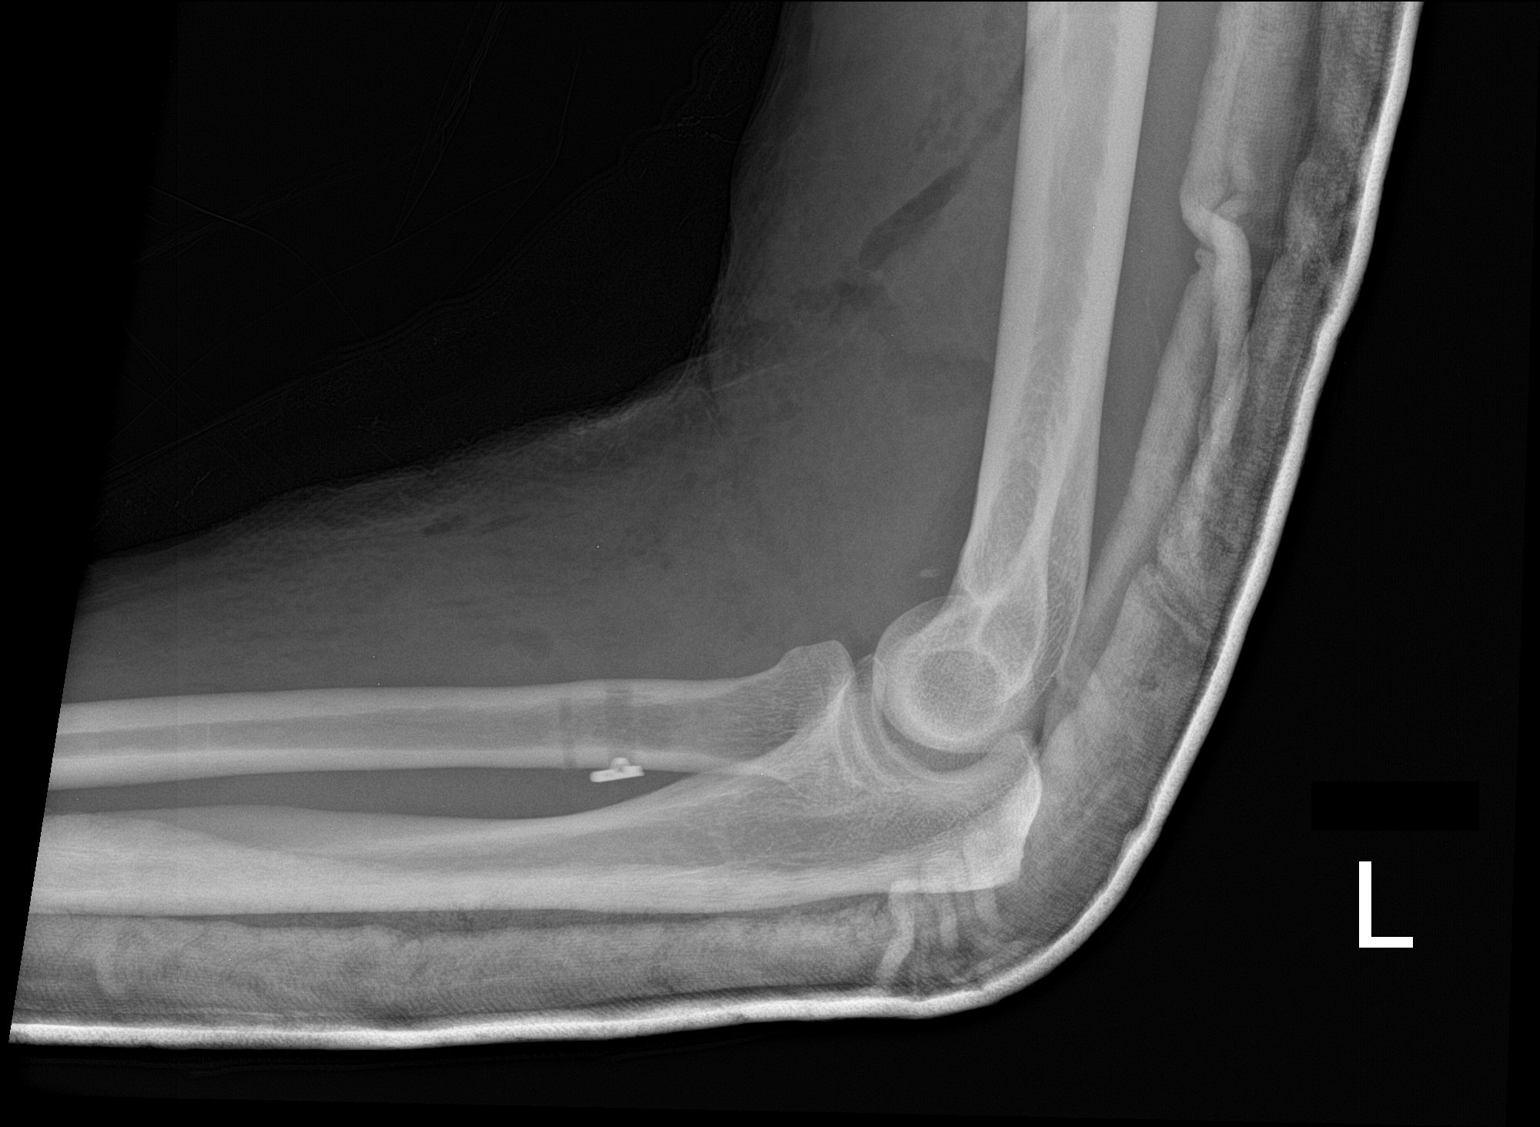

[2 of 2 positions shown; findings below may reference images not displayed]

FINDINGS: Frontal and lateral views of the left elbow are obtained. Casting
material obscures underlying bony detail. Small avulsion fracture
off the lateral humeral epicondyle again noted unchanged. Stable
surgical anchor proximal radius. Continued joint effusion. Anatomic
alignment of the left elbow. Subcutaneous gas identified from
previous soft tissue injury and repair.
IMPRESSION: 1. Small avulsion fracture lateral humeral epicondyle unchanged.
2. Surgical anchor proximal radius unchanged in position.
3. Stable joint effusion.
4. Soft tissue gas consistent with previous laceration and surgery.
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
## 2021-01-20 IMAGING — RF DG C-ARM 1-60 MIN
1 series · 5 of 5 positions shown · non-contrast
Comparison: Elbow CT earlier today.

CLINICAL DATA: External fixation of left elbow.

EXAM:
DG C-ARM 1-60 MIN; LEFT ELBOW - 2 VIEW
FLUOROSCOPY TIME:  Fluoroscopy Time:  42 seconds
Radiation Exposure Index (if provided by the fluoroscopic device):
Not available.
Number of Acquired Spot Images: 5

[Series 1: run · 5 of 5 slices shown]
[im 1/5]
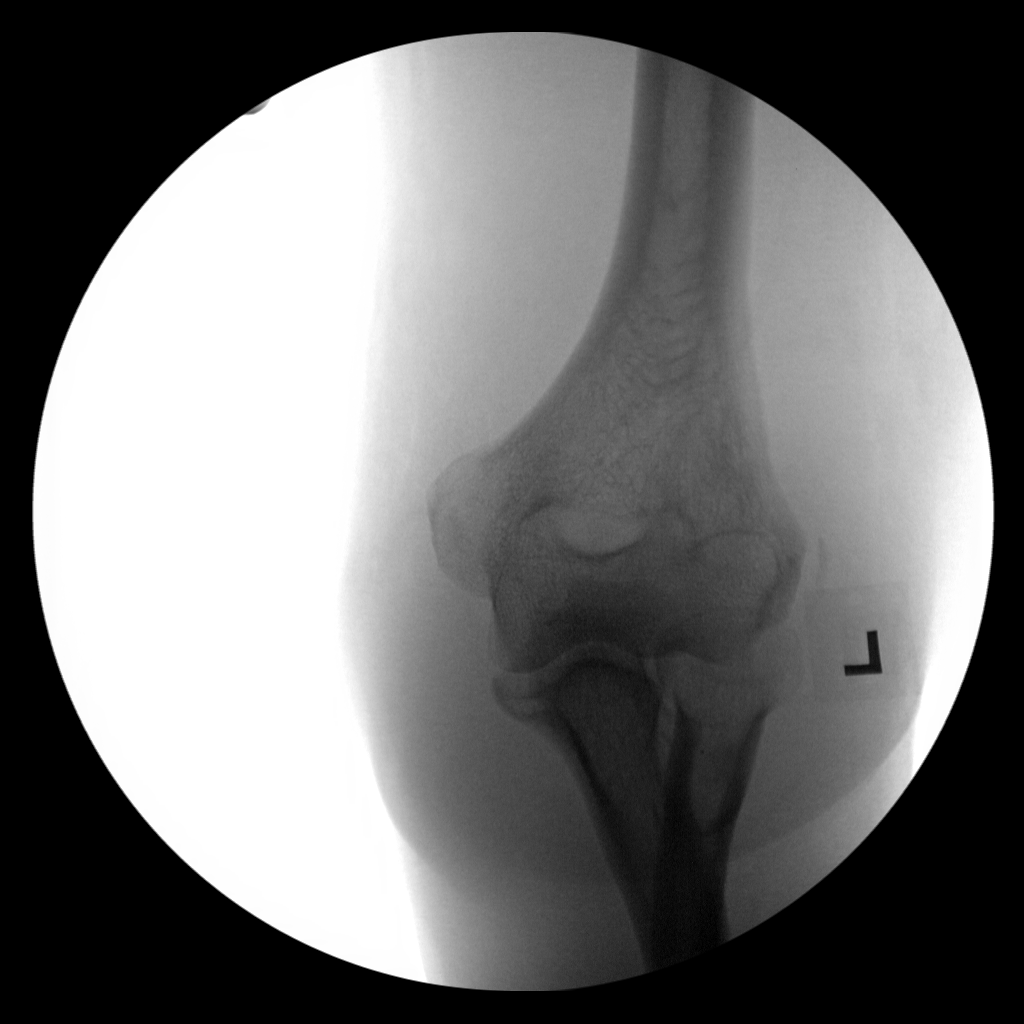
[im 2/5]
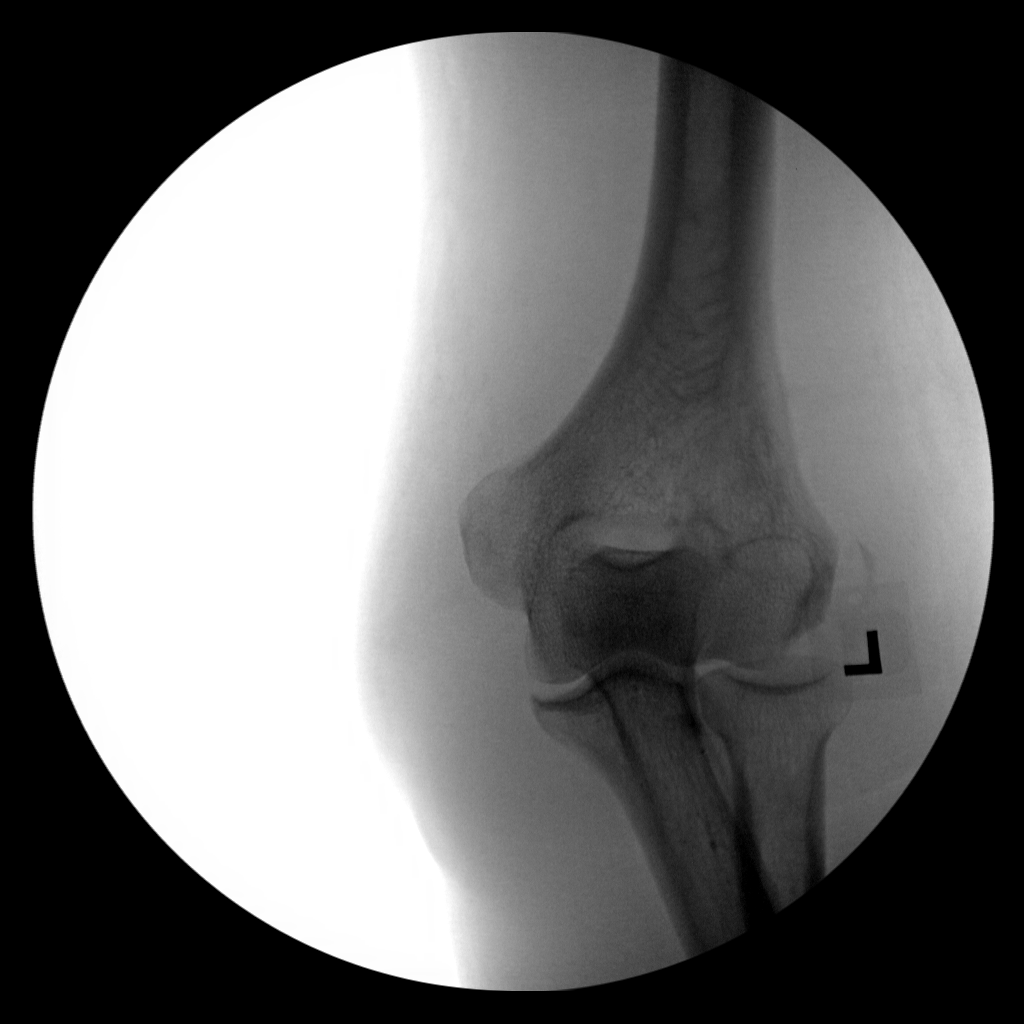
[im 3/5]
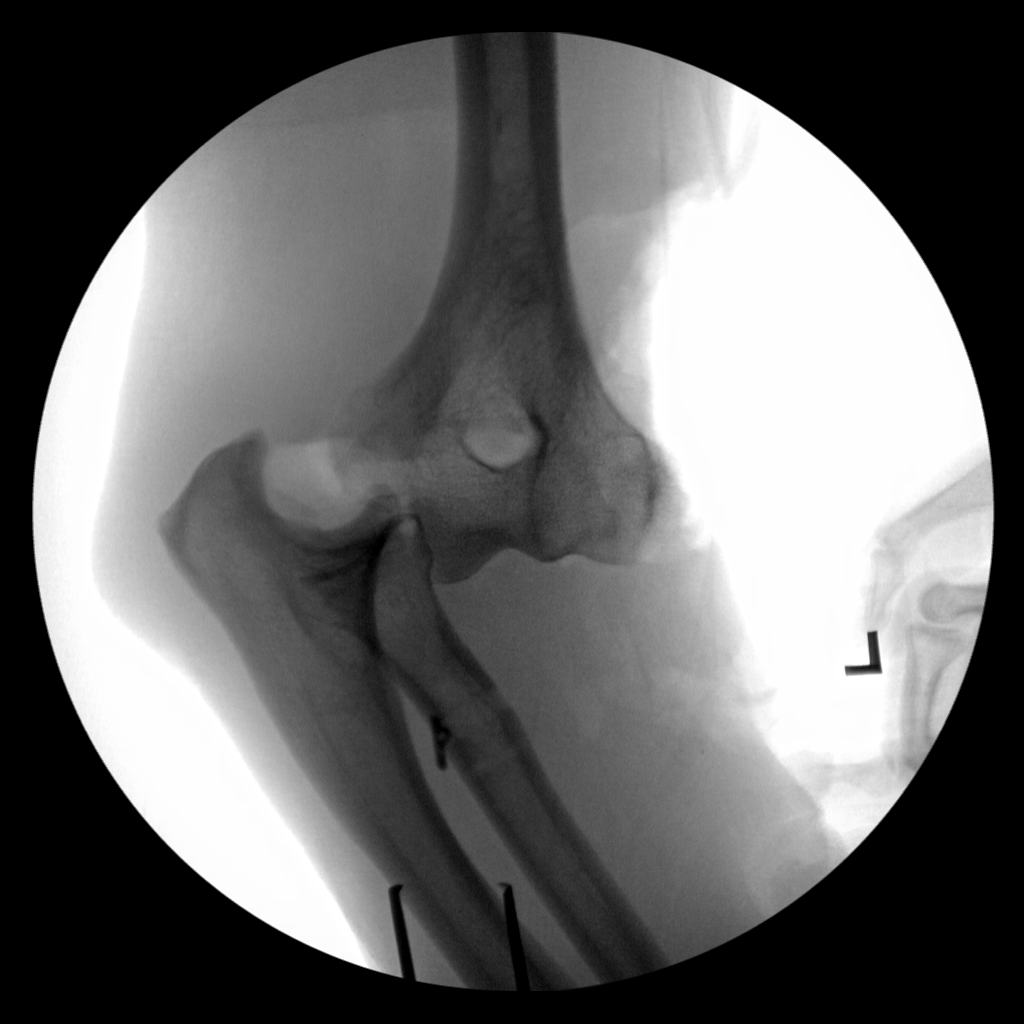
[im 4/5]
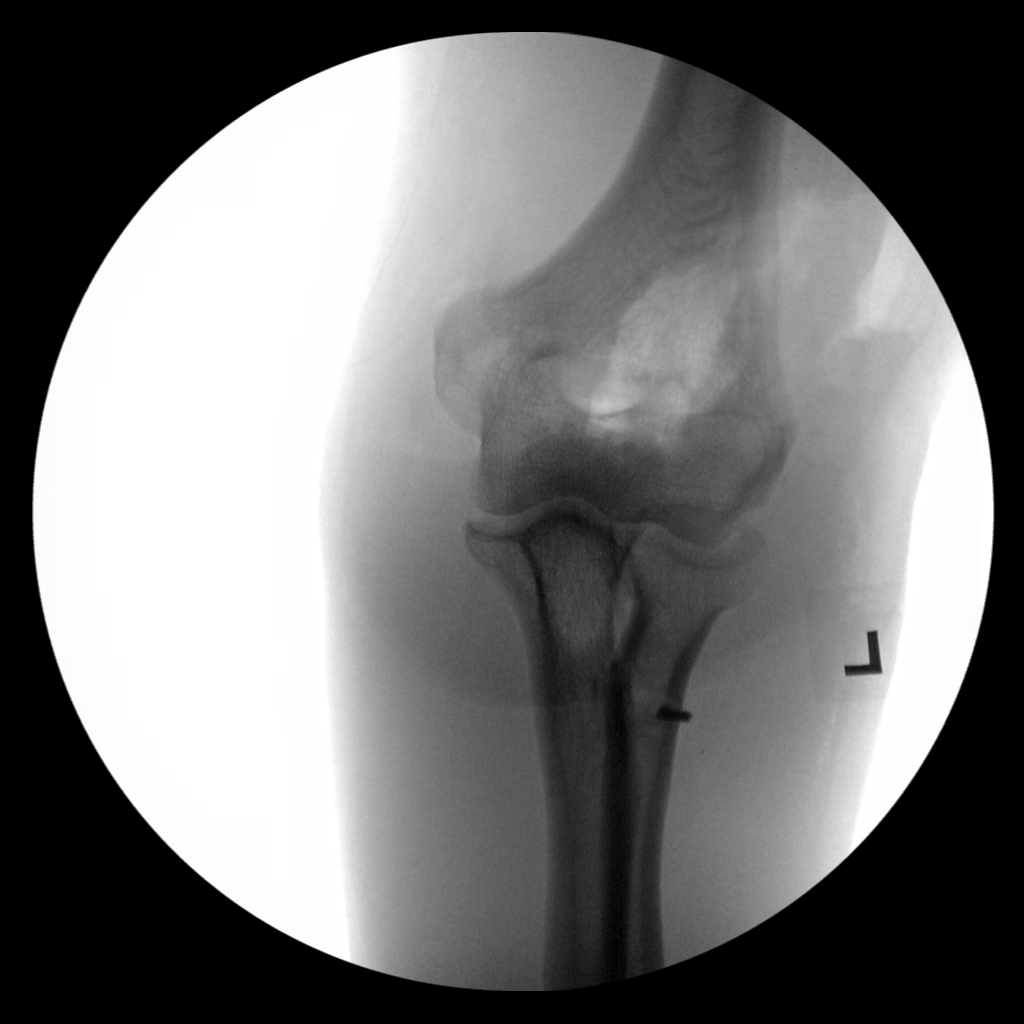
[im 5/5]
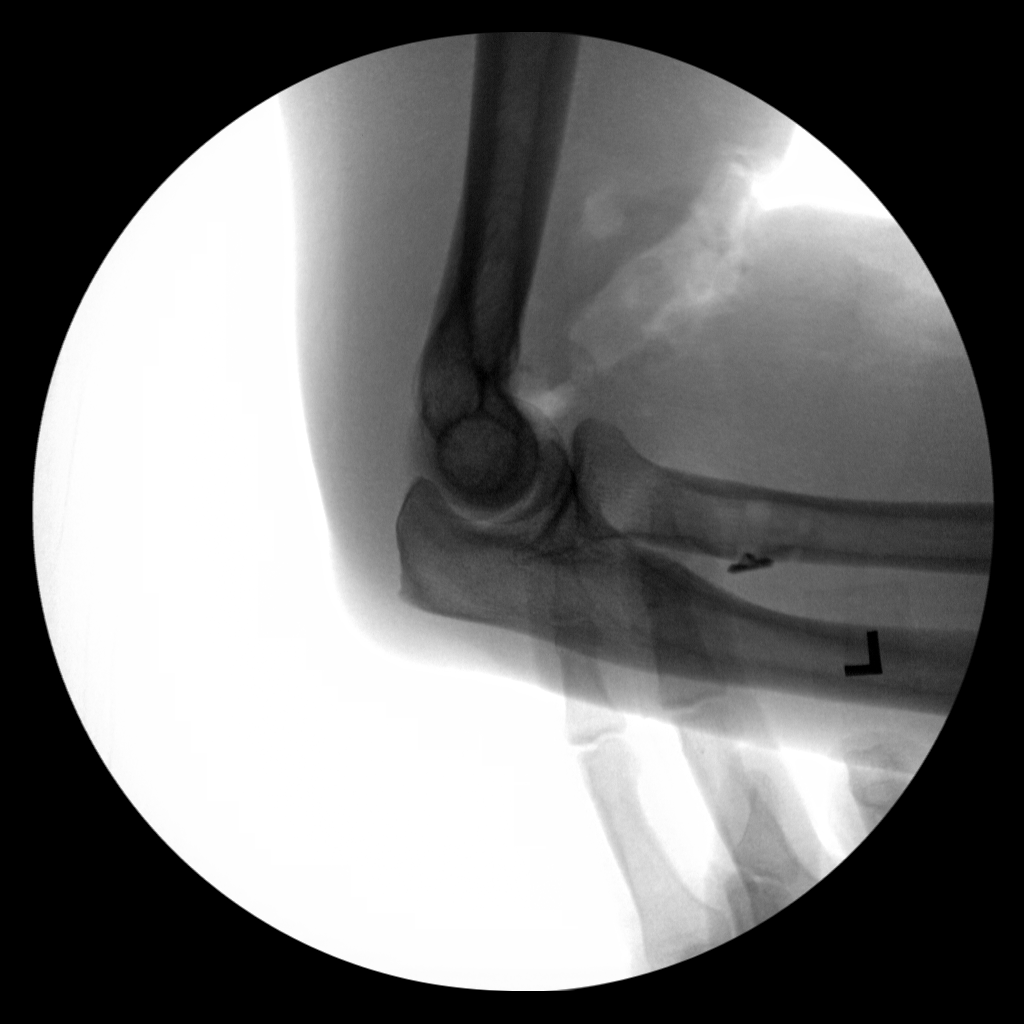

[5 of 5 positions shown; findings below may reference images not displayed]

FINDINGS: Five fluoroscopic spot views of the left elbow obtained in frontal
and lateral projections. Surgical button in proximal radius. Soft
tissue changes are noted.
IMPRESSION: Intraoperative fluoroscopy during left elbow surgery.

## 2021-01-20 IMAGING — RF DG ELBOW 2V*L*
1 series · 5 of 5 positions shown · non-contrast
Comparison: Elbow CT earlier today.

CLINICAL DATA: External fixation of left elbow.

EXAM:
DG C-ARM 1-60 MIN; LEFT ELBOW - 2 VIEW
FLUOROSCOPY TIME:  Fluoroscopy Time:  42 seconds
Radiation Exposure Index (if provided by the fluoroscopic device):
Not available.
Number of Acquired Spot Images: 5

[Series 1: run · 5 of 5 slices shown]
[im 1/5]
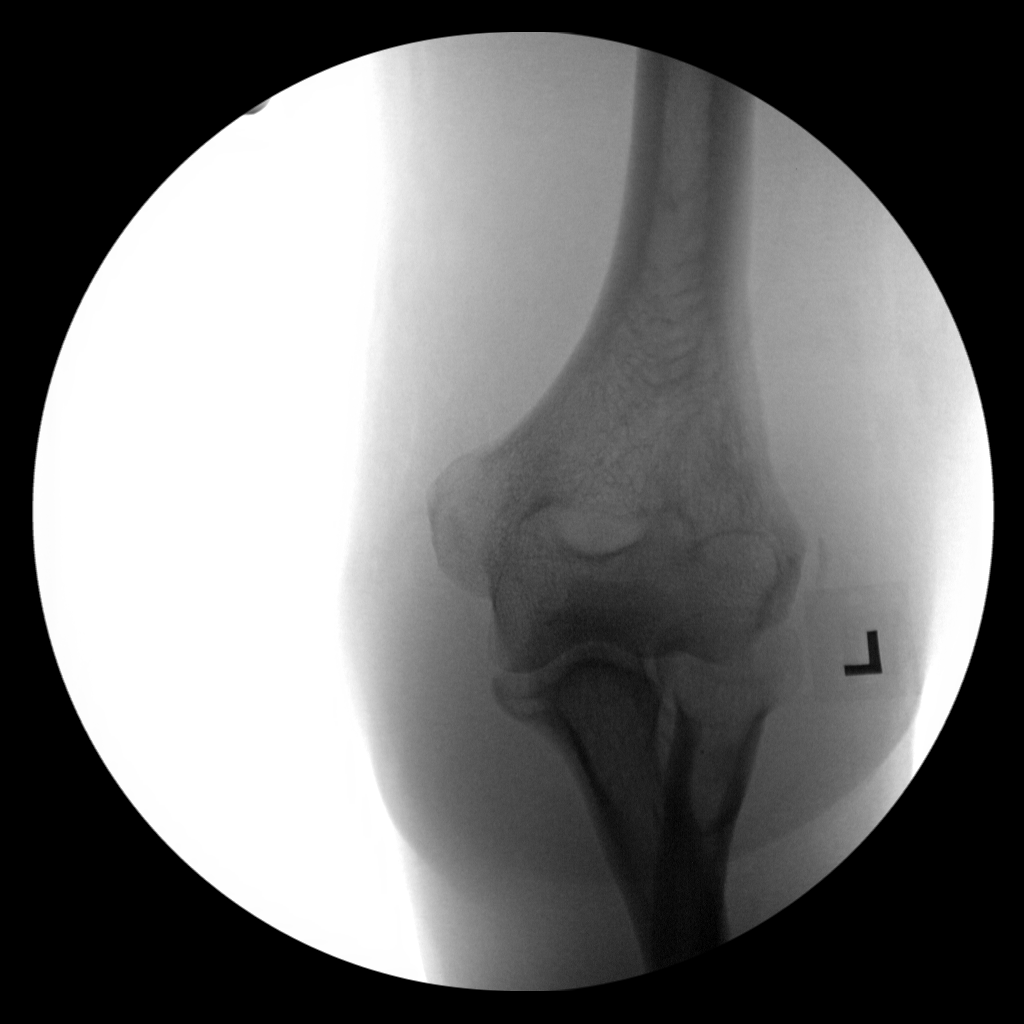
[im 2/5]
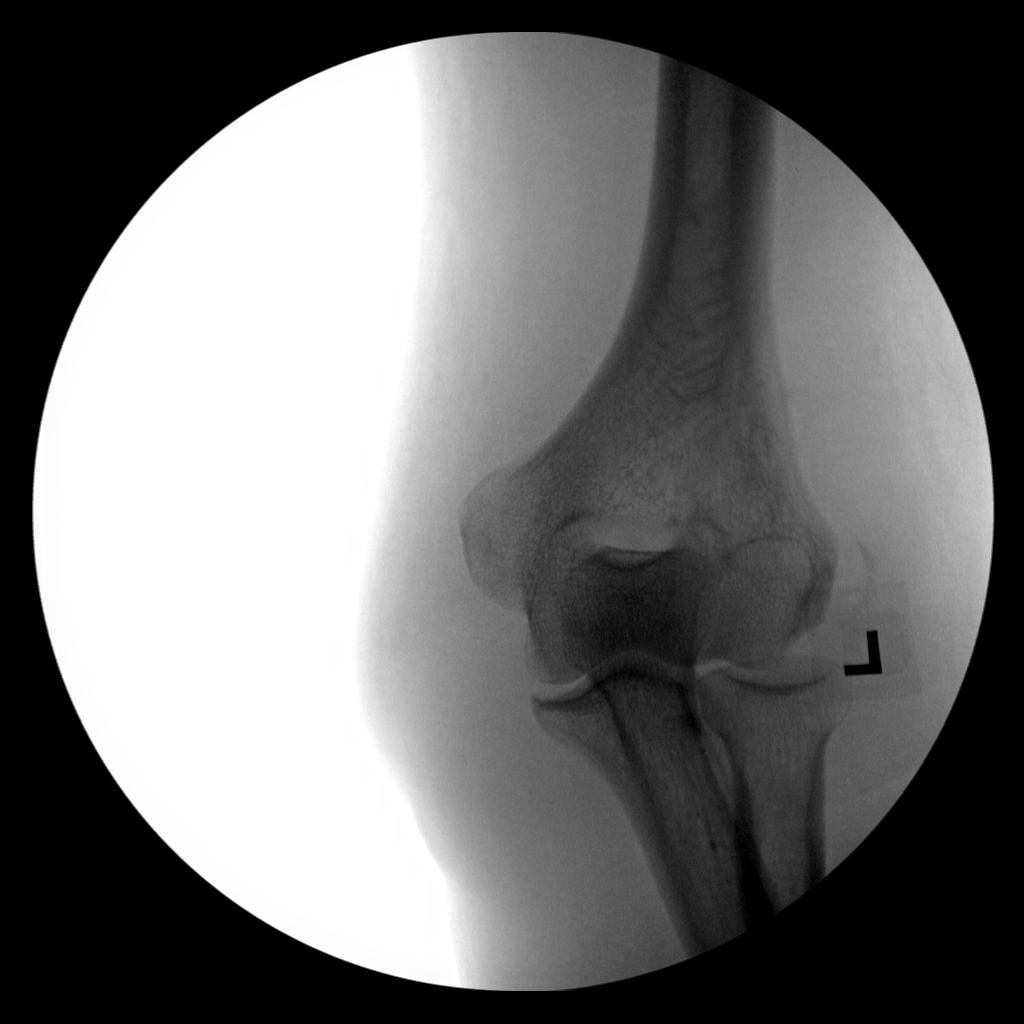
[im 3/5]
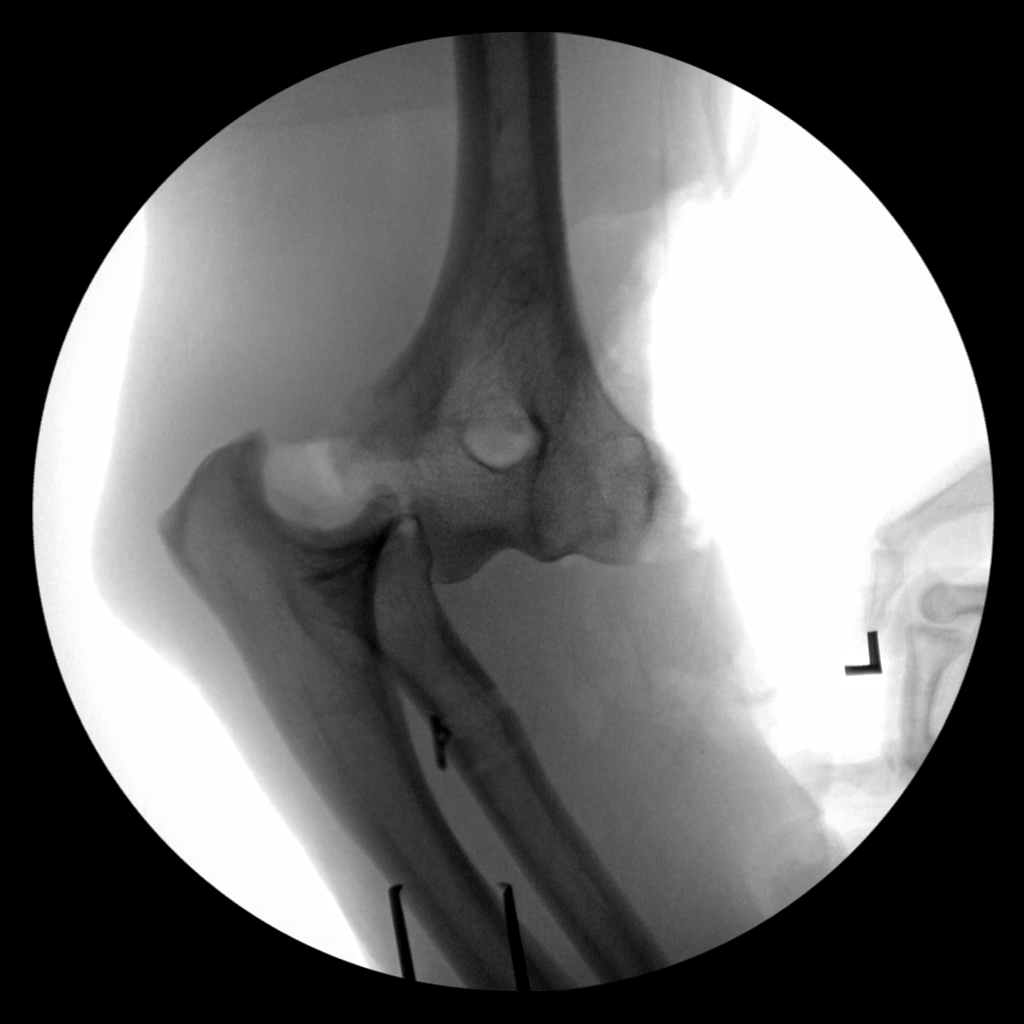
[im 4/5]
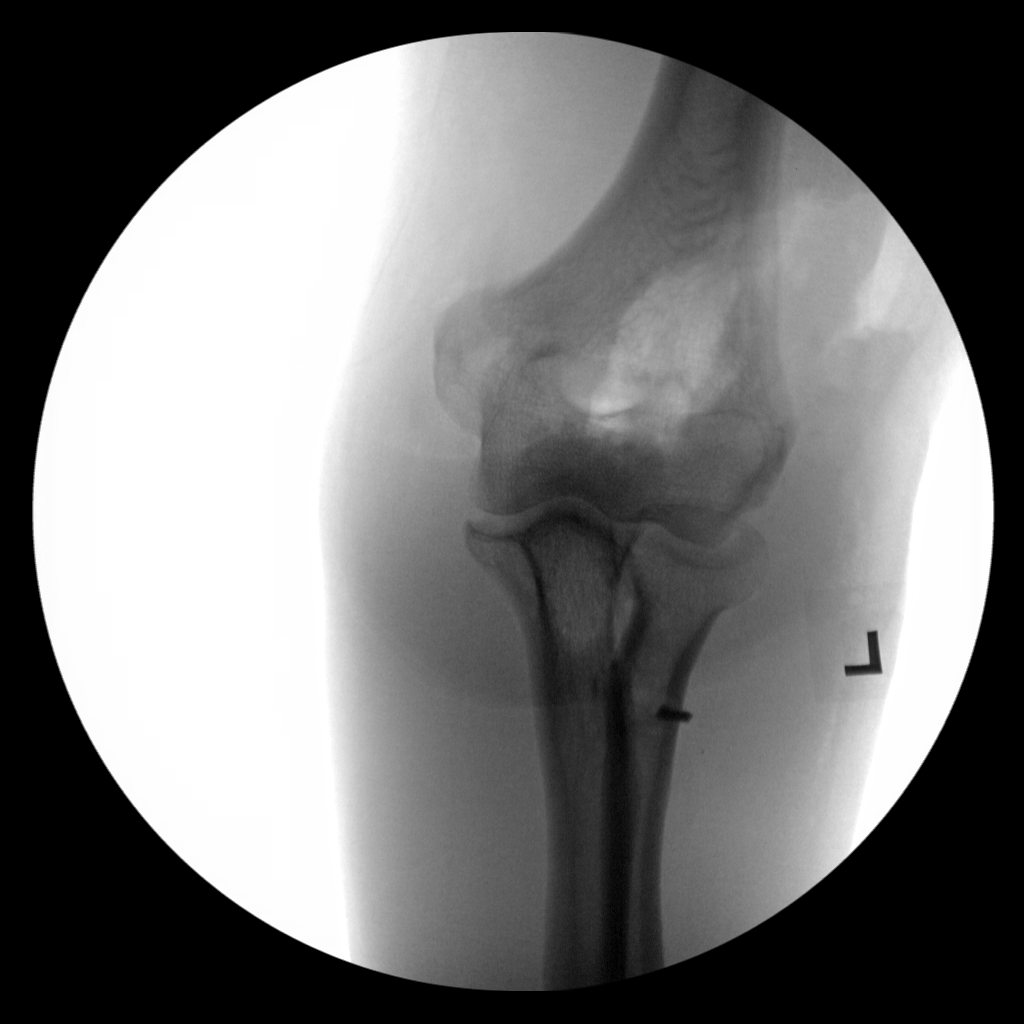
[im 5/5]
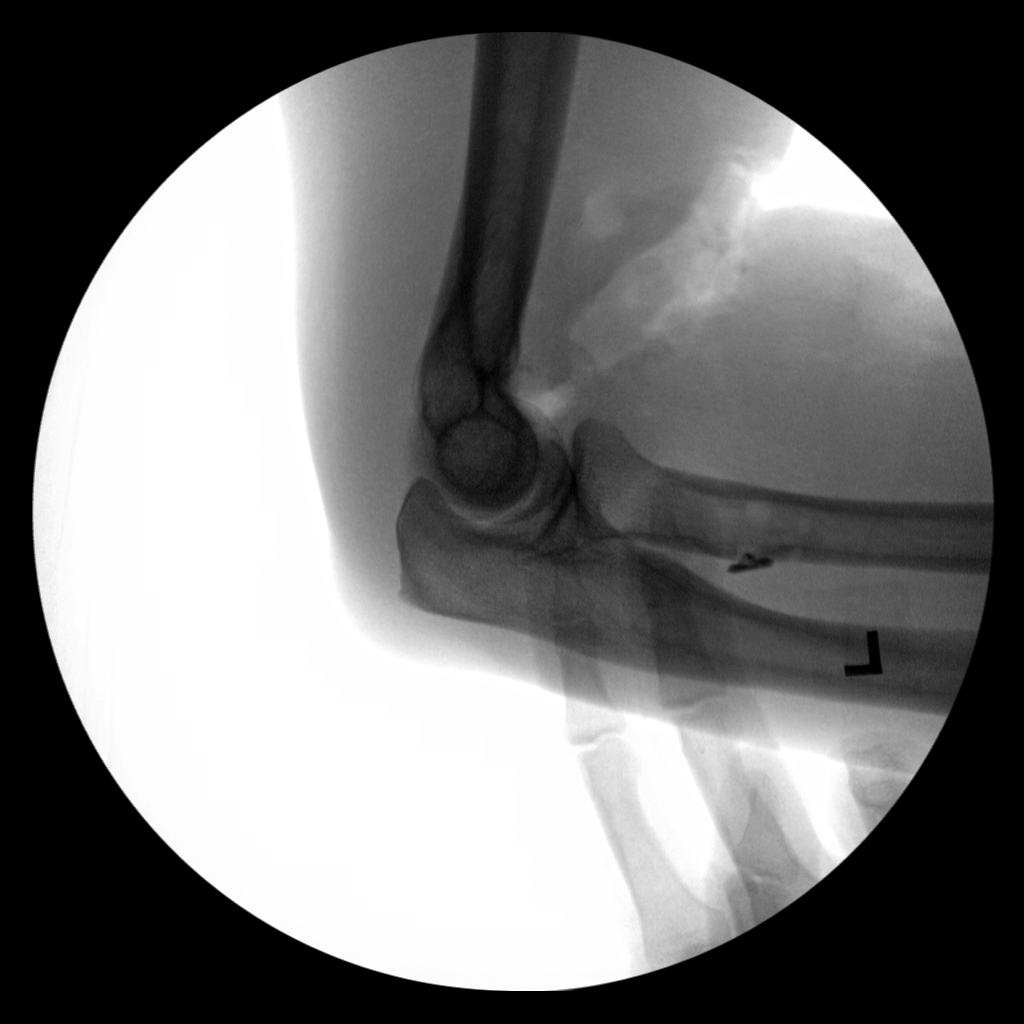

[5 of 5 positions shown; findings below may reference images not displayed]

FINDINGS: Five fluoroscopic spot views of the left elbow obtained in frontal
and lateral projections. Surgical button in proximal radius. Soft
tissue changes are noted.
IMPRESSION: Intraoperative fluoroscopy during left elbow surgery.

## 2021-01-20 SURGERY — IRRIGATION AND DEBRIDEMENT EXTREMITY
Anesthesia: General | Site: Elbow | Laterality: Left

## 2021-01-20 MED ORDER — PROSOURCE TF PO LIQD: 45.0000 mL | Freq: Two times a day (BID) | ORAL | Status: DC

## 2021-01-20 MED ORDER — MIDAZOLAM HCL 2 MG/2ML IJ SOLN
INTRAMUSCULAR | Status: AC
  Filled 2021-01-20: qty 2

## 2021-01-20 MED ORDER — MIDAZOLAM HCL 5 MG/5ML IJ SOLN
INTRAMUSCULAR | Status: DC | PRN
  Administered 2021-01-20: 2 mg via INTRAVENOUS

## 2021-01-20 MED ORDER — EPHEDRINE 5 MG/ML INJ
INTRAVENOUS | Status: AC
  Filled 2021-01-20: qty 10

## 2021-01-20 MED ORDER — VITAL HIGH PROTEIN PO LIQD: 1000.0000 mL | ORAL | Status: DC

## 2021-01-20 MED ORDER — VANCOMYCIN HCL 1000 MG IV SOLR
INTRAVENOUS | Status: DC | PRN
  Administered 2021-01-20: 1000 mg

## 2021-01-20 MED ORDER — LACTATED RINGERS IV SOLN: INTRAVENOUS | Status: DC | PRN

## 2021-01-20 MED ORDER — ALBUMIN HUMAN 5 % IV SOLN: INTRAVENOUS | Status: DC | PRN

## 2021-01-20 MED ORDER — LACTATED RINGERS IV BOLUS
1000.0000 mL | Freq: Once | INTRAVENOUS | Status: AC
  Administered 2021-01-20: 1000 mL via INTRAVENOUS

## 2021-01-20 MED ORDER — FENTANYL CITRATE (PF) 250 MCG/5ML IJ SOLN
INTRAMUSCULAR | Status: AC
  Filled 2021-01-20: qty 5

## 2021-01-20 MED ORDER — PROSOURCE TF PO LIQD
90.0000 mL | Freq: Three times a day (TID) | ORAL | Status: DC
  Administered 2021-01-20 – 2021-01-23 (×11): 90 mL
  Filled 2021-01-20 (×10): qty 90

## 2021-01-20 MED ORDER — VANCOMYCIN HCL 1000 MG IV SOLR
INTRAVENOUS | Status: AC
  Filled 2021-01-20: qty 1000

## 2021-01-20 MED ORDER — DEXAMETHASONE SODIUM PHOSPHATE 10 MG/ML IJ SOLN
INTRAMUSCULAR | Status: AC
  Filled 2021-01-20: qty 1

## 2021-01-20 MED ORDER — PHENYLEPHRINE HCL-NACL 10-0.9 MG/250ML-% IV SOLN
INTRAVENOUS | Status: DC | PRN
  Administered 2021-01-20: 30 ug/min via INTRAVENOUS

## 2021-01-20 MED ORDER — ROCURONIUM BROMIDE 10 MG/ML (PF) SYRINGE
PREFILLED_SYRINGE | INTRAVENOUS | Status: AC
  Filled 2021-01-20: qty 10

## 2021-01-20 MED ORDER — FENTANYL CITRATE (PF) 100 MCG/2ML IJ SOLN
INTRAMUSCULAR | Status: DC | PRN
  Administered 2021-01-20: 75 ug via INTRAVENOUS
  Administered 2021-01-20: 100 ug via INTRAVENOUS
  Administered 2021-01-20: 75 ug via INTRAVENOUS

## 2021-01-20 MED ORDER — TOBRAMYCIN SULFATE 1.2 G IJ SOLR
INTRAMUSCULAR | Status: DC | PRN
  Administered 2021-01-20: 1.2 g

## 2021-01-20 MED ORDER — CEFTRIAXONE SODIUM 2 G IJ SOLR
2.0000 g | INTRAMUSCULAR | Status: AC
  Administered 2021-01-21 – 2021-01-23 (×3): 2 g via INTRAVENOUS
  Filled 2021-01-20 (×3): qty 2

## 2021-01-20 MED ORDER — PIVOT 1.5 CAL PO LIQD
1000.0000 mL | ORAL | Status: DC
  Administered 2021-01-20 – 2021-01-23 (×3): 1000 mL

## 2021-01-20 MED ORDER — SUCCINYLCHOLINE CHLORIDE 200 MG/10ML IV SOSY
PREFILLED_SYRINGE | INTRAVENOUS | Status: AC
  Filled 2021-01-20: qty 10

## 2021-01-20 MED ORDER — ONDANSETRON HCL 4 MG/2ML IJ SOLN
INTRAMUSCULAR | Status: AC
  Filled 2021-01-20: qty 2

## 2021-01-20 MED ORDER — TOBRAMYCIN SULFATE 1.2 G IJ SOLR
INTRAMUSCULAR | Status: AC
  Filled 2021-01-20: qty 1.2

## 2021-01-20 MED ORDER — SODIUM CHLORIDE 0.9 % IR SOLN
Status: DC | PRN
  Administered 2021-01-20: 3000 mL

## 2021-01-20 MED ORDER — ADULT MULTIVITAMIN W/MINERALS CH
1.0000 | ORAL_TABLET | Freq: Every day | ORAL | Status: DC
  Administered 2021-01-20 – 2021-01-23 (×4): 1
  Filled 2021-01-20 (×4): qty 1

## 2021-01-20 MED ORDER — PHENYLEPHRINE 40 MCG/ML (10ML) SYRINGE FOR IV PUSH (FOR BLOOD PRESSURE SUPPORT)
PREFILLED_SYRINGE | INTRAVENOUS | Status: AC
  Filled 2021-01-20: qty 30

## 2021-01-20 MED ORDER — LIDOCAINE 2% (20 MG/ML) 5 ML SYRINGE
INTRAMUSCULAR | Status: AC
  Filled 2021-01-20: qty 10

## 2021-01-20 MED ORDER — 0.9 % SODIUM CHLORIDE (POUR BTL) OPTIME
TOPICAL | Status: DC | PRN
  Administered 2021-01-20: 1000 mL

## 2021-01-20 SURGICAL SUPPLY — 72 items
"broadband loop, non-absorbable, suture tape, 25"" t" IMPLANT
ANCH SUT 2 SHRT 1.45 DRLBT (Anchor) ×2 IMPLANT
ANCHOR JUGGERKNOT W/DRL 2/1.45 (Anchor) ×2 IMPLANT
APL PRP STRL LF DISP 70% ISPRP (MISCELLANEOUS) ×2
BNDG CMPR STD VLCR NS LF 5.8X3 (GAUZE/BANDAGES/DRESSINGS) ×2
BNDG COHESIVE 4X5 TAN STRL (GAUZE/BANDAGES/DRESSINGS) ×3 IMPLANT
BNDG COHESIVE 6X5 TAN STRL LF (GAUZE/BANDAGES/DRESSINGS) ×2 IMPLANT
BNDG ELASTIC 3X5.8 VLCR NS LF (GAUZE/BANDAGES/DRESSINGS) ×2 IMPLANT
BNDG ELASTIC 4X5.8 VLCR STR LF (GAUZE/BANDAGES/DRESSINGS) ×3 IMPLANT
BNDG ELASTIC 6X5.8 VLCR STR LF (GAUZE/BANDAGES/DRESSINGS) ×1 IMPLANT
BNDG GAUZE ELAST 4 BULKY (GAUZE/BANDAGES/DRESSINGS) ×4 IMPLANT
BRUSH SCRUB EZ PLAIN DRY (MISCELLANEOUS) ×6 IMPLANT
CHLORAPREP W/TINT 26 (MISCELLANEOUS) ×3 IMPLANT
COVER MAYO STAND STRL (DRAPES) ×3 IMPLANT
COVER SURGICAL LIGHT HANDLE (MISCELLANEOUS) ×4 IMPLANT
COVER WAND RF STERILE (DRAPES) ×3 IMPLANT
DRAPE C-ARM 42X72 X-RAY (DRAPES) ×2 IMPLANT
DRAPE C-ARMOR (DRAPES) ×1 IMPLANT
DRAPE IMP U-DRAPE 54X76 (DRAPES) ×6 IMPLANT
DRAPE ORTHO SPLIT 77X108 STRL (DRAPES) ×6
DRAPE SURG 17X23 STRL (DRAPES) ×3 IMPLANT
DRAPE SURG ORHT 6 SPLT 77X108 (DRAPES) ×4 IMPLANT
DRAPE U-SHAPE 47X51 STRL (DRAPES) ×3 IMPLANT
DRSG ADAPTIC 3X8 NADH LF (GAUZE/BANDAGES/DRESSINGS) ×3 IMPLANT
DRSG MEPITEL 4X7.2 (GAUZE/BANDAGES/DRESSINGS) ×4 IMPLANT
ELECT REM PT RETURN 9FT ADLT (ELECTROSURGICAL) ×3
ELECTRODE REM PT RTRN 9FT ADLT (ELECTROSURGICAL) ×2 IMPLANT
EVACUATOR 1/8 PVC DRAIN (DRAIN) IMPLANT
GAUZE SPONGE 4X4 12PLY STRL (GAUZE/BANDAGES/DRESSINGS) ×3 IMPLANT
GLOVE BIO SURGEON STRL SZ 6.5 (GLOVE) ×9 IMPLANT
GLOVE BIO SURGEON STRL SZ7.5 (GLOVE) ×12 IMPLANT
GLOVE BIOGEL PI IND STRL 7.5 (GLOVE) ×2 IMPLANT
GLOVE BIOGEL PI INDICATOR 7.5 (GLOVE) ×1
GLOVE SURG UNDER POLY LF SZ6.5 (GLOVE) ×3 IMPLANT
GOWN STRL REUS W/ TWL LRG LVL3 (GOWN DISPOSABLE) ×4 IMPLANT
GOWN STRL REUS W/TWL LRG LVL3 (GOWN DISPOSABLE) ×6
HANDPIECE INTERPULSE COAX TIP (DISPOSABLE) ×3
IMPL TOGGLELOC ELBOW SYSTEM (Orthopedic Implant) ×1 IMPLANT
IMPLANT TOGGLELOC ELBOW SYSTEM (Orthopedic Implant) ×3 IMPLANT
KIT BASIN OR (CUSTOM PROCEDURE TRAY) ×3 IMPLANT
KIT TURNOVER KIT B (KITS) ×3 IMPLANT
MANIFOLD NEPTUNE II (INSTRUMENTS) ×3 IMPLANT
NEEDLE 22X1 1/2 (OR ONLY) (NEEDLE) IMPLANT
NS IRRIG 1000ML POUR BTL (IV SOLUTION) ×3 IMPLANT
PACK ORTHO EXTREMITY (CUSTOM PROCEDURE TRAY) ×3 IMPLANT
PAD ABD 8X10 STRL (GAUZE/BANDAGES/DRESSINGS) ×2 IMPLANT
PAD ARMBOARD 7.5X6 YLW CONV (MISCELLANEOUS) ×6 IMPLANT
PADDING CAST ABS 3INX4YD NS (CAST SUPPLIES) ×1
PADDING CAST ABS 4INX4YD NS (CAST SUPPLIES) ×1
PADDING CAST ABS 6INX4YD NS (CAST SUPPLIES)
PADDING CAST ABS COTTON 3X4 (CAST SUPPLIES) ×1 IMPLANT
PADDING CAST ABS COTTON 4X4 ST (CAST SUPPLIES) ×1 IMPLANT
PADDING CAST ABS COTTON 6X4 NS (CAST SUPPLIES) IMPLANT
PADDING CAST COTTON 6X4 STRL (CAST SUPPLIES) ×6 IMPLANT
SET HNDPC FAN SPRY TIP SCT (DISPOSABLE) ×1 IMPLANT
SPONGE LAP 18X18 RF (DISPOSABLE) ×3 IMPLANT
STAPLER VISISTAT 35W (STAPLE) ×1 IMPLANT
STRIP CLOSURE SKIN 1/2X4 (GAUZE/BANDAGES/DRESSINGS) IMPLANT
SUT BRDBAND LOOP ST-NDL BL (SUTURE) ×2 IMPLANT
SUT ETHILON 2 0 FS 18 (SUTURE) ×6 IMPLANT
SUT ETHILON 3 0 PS 1 (SUTURE) ×12 IMPLANT
SUT MNCRL AB 3-0 PS2 27 (SUTURE) ×4 IMPLANT
SUT MON AB 2-0 CT1 36 (SUTURE) ×5 IMPLANT
SUT PDS AB 0 CT 36 (SUTURE) ×2 IMPLANT
SUT PROLENE 3 0 PS 2 (SUTURE) ×2 IMPLANT
TOWEL GREEN STERILE (TOWEL DISPOSABLE) ×6 IMPLANT
TOWEL GREEN STERILE FF (TOWEL DISPOSABLE) ×6 IMPLANT
TUBE CONNECTING 12X1/4 (SUCTIONS) ×3 IMPLANT
UNDERPAD 30X36 HEAVY ABSORB (UNDERPADS AND DIAPERS) ×3 IMPLANT
WATER STERILE IRR 1000ML POUR (IV SOLUTION) ×3 IMPLANT
YANKAUER SUCT BULB TIP NO VENT (SUCTIONS) ×3 IMPLANT
broadband loop, non-absorbable, suture tape, 25" t ×2 IMPLANT
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
## 2021-01-20 NOTE — Anesthesia Postprocedure Evaluation (Signed)
Anesthesia Post Note  Patient: Kenneth Mcdowell  Procedure(s) Performed: IRRIGATION AND DEBRIDEMENT ELBOW (Left Elbow) DISTAL BICEPS TENDON REPAIR, lateral and collateral ligament repair (Left Elbow)     Patient location during evaluation: SICU Anesthesia Type: General Level of consciousness: sedated Pain management: pain level controlled Vital Signs Assessment: post-procedure vital signs reviewed and stable Respiratory status: patient remains intubated per anesthesia plan Cardiovascular status: stable Postop Assessment: no apparent nausea or vomiting Anesthetic complications: no   No complications documented.  Last Vitals:  Vitals:   01/20/21 1300 01/20/21 1621  BP: (!) 101/59   Pulse: 80   Resp: 16   Temp:    SpO2: 100% 100%    Last Pain:  Vitals:   01/20/21 1200  TempSrc: Axillary                 Lorie Melichar S

## 2021-01-20 NOTE — Progress Notes (Signed)
Patient transported to CT and back without complications. RN at bedside.  
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
## 2021-01-20 NOTE — Interval H&P Note (Signed)
History and Physical Interval Note:  01/20/2021 12:53 PM  Kenneth Mcdowell  has presented today for surgery, with the diagnosis of LEft open elbow dislocation.  The various methods of treatment have been discussed with the patient and family. After consideration of risks, benefits and other options for treatment, the patient has consented to  Procedure(s): IRRIGATION AND DEBRIDEMENT ELBOW (Left) EXTERNAL FIXATION ELBOW (Left) as a surgical intervention.  The patient's history has been reviewed, patient examined, no change in status, stable for surgery.  I have reviewed the patient's chart and labs.  Questions were answered to the patient's satisfaction.     Lennette Bihari P Makya Phillis
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
## 2021-01-20 NOTE — Procedures (Signed)
Cortrak  Person Inserting Tube:  Rosezetta Schlatter, RD Tube Type:  Cortrak - 43 inches Tube Location:  Left nare Initial Placement:  Stomach Secured by: Bridle Technique Used to Measure Tube Placement:  Documented cm marking at nare/ corner of mouth Cortrak Secured At:  68 cm Procedure Comments:  Cortrak Tube Team Note:  Consult received to place a Cortrak feeding tube.   X-ray is required, abdominal x-ray has been ordered by the Cortrak team. Please confirm tube placement before using the Cortrak tube.   If the tube becomes dislodged please keep the tube and contact the Cortrak team at [email or web address] ([email or web address]) for replacement.  If after hours and replacement cannot be delayed, place a NG tube and confirm placement with an abdominal x-ray.      Jarome Matin, MS, RD, LDN, CNSC Inpatient Clinical Dietitian RD pager # available in [place]  After hours/weekend pager # available in [facility]

## 2021-01-20 NOTE — Transfer of Care (Signed)
Immediate Anesthesia Transfer of Care Note  Patient: Kenneth Mcdowell  Procedure(s) Performed: IRRIGATION AND DEBRIDEMENT ELBOW (Left Elbow) DISTAL BICEPS TENDON REPAIR, lateral and collateral ligament repair (Left Elbow)  Patient Location: ICU  Anesthesia Type:General  Level of Consciousness: Patient remains intubated per anesthesia plan  Airway & Oxygen Therapy: Patient remains intubated per anesthesia plan and Patient placed on Ventilator (see vital sign flow sheet for setting)  Post-op Assessment: Report given to RN and Post -op Vital signs reviewed and stable  Post vital signs: Reviewed and stable  Last Vitals:  Vitals Value Taken Time  BP    Temp    Pulse    Resp    SpO2      Last Pain:  Vitals:   01/20/21 1200  TempSrc: Axillary         Complications: No complications documented.

## 2021-01-20 NOTE — Anesthesia Preprocedure Evaluation (Addendum)
Anesthesia Evaluation  Patient identified by MRN, date of birth, ID band Patient unresponsive    Reviewed: Allergy & Precautions, NPO status , Patient's Chart, lab work & pertinent test results, Unable to perform ROS - Chart review only  History of Anesthesia Complications Negative for: history of anesthetic complications  Airway Mallampati: Intubated       Dental   Pulmonary asthma ,       + intubated    Cardiovascular negative cardio ROS Normal cardiovascular exam     Neuro/Psych  C-spine not cleared negative psych ROS   GI/Hepatic negative GI ROS, (+)     substance abuse (polysubstance abuse )  cocaine use and methamphetamine use,   Endo/Other  negative endocrine ROS  Renal/GU Renal InsufficiencyRenal diseaseCr 1.27  negative genitourinary   Musculoskeletal L elbow dislocation   Abdominal   Peds  Hematology  (+) Blood dyscrasia, anemia , H/H 7.5/22, plt 143   Anesthesia Other Findings LUE partial amputation/ open left elbow dislocation - xray pending. per Dr. Marlou Sa, S/P elbow reduction and washout 3/9 by Dr. Marlou Sa. Giving rocephin and tdap in ED Degloving injury to left wrist - S/P debridement and closure L hand and wrist lacs by Dr. Apolonio Schneiders 3/9 L4 burst fx - per Dr. Marcello Moores. I spoke with him and reviewed the MRI on the unit this AM. He plans lumbar fixation later today. Left T9-10 TVP fxs - pain control Left 9-11 rib fxs with pulm contusion - pain control, pulm toilet, CXR now Acute hypoxic ventilator dependent respiratory failure - full support as going to OR, check ABG BRBPR - blood noted on digital rectal exam, S/P rigid procto by Dr. Barry Dienes 3/9. Had a hemorrhoid, no injury. H/obipolar disorder/ADHD Asthma/ COPD Hx polysubstance abuse (amphetamines/heroin/cocaine/marijuana) - may need to add Precedex  Reproductive/Obstetrics negative OB ROS                            Anesthesia  Physical Anesthesia Plan  ASA: IV  Anesthesia Plan: General   Post-op Pain Management:    Induction: Intravenous  PONV Risk Score and Plan: 2 and Treatment may vary due to age or medical condition, Ondansetron, Propofol infusion and Midazolam  Airway Management Planned: Oral ETT  Additional Equipment:   Intra-op Plan:   Post-operative Plan: Post-operative intubation/ventilation  Informed Consent: I have reviewed the patients History and Physical, chart, labs and discussed the procedure including the risks, benefits and alternatives for the proposed anesthesia with the patient or authorized representative who has indicated his/her understanding and acceptance.     Dental advisory given  Plan Discussed with: CRNA  Anesthesia Plan Comments: (Access: PIV x 2)       Anesthesia Quick Evaluation
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
## 2021-01-21 ENCOUNTER — Encounter (HOSPITAL_COMMUNITY): Payer: Self-pay

## 2021-01-21 LAB — CBC
HCT: 18.9 % — ABNORMAL LOW (ref 39.0–52.0)
Hemoglobin: 6.2 g/dL — CL (ref 13.0–17.0)
MCH: 30.4 pg (ref 26.0–34.0)
MCHC: 32.8 g/dL (ref 30.0–36.0)
MCV: 92.6 fL (ref 80.0–100.0)
Platelets: 101 10*3/uL — ABNORMAL LOW (ref 150–400)
RBC: 2.04 MIL/uL — ABNORMAL LOW (ref 4.22–5.81)
RDW: 14.1 % (ref 11.5–15.5)
WBC: 5.2 10*3/uL (ref 4.0–10.5)
nRBC: 0 % (ref 0.0–0.2)

## 2021-01-21 LAB — BASIC METABOLIC PANEL
Anion gap: 7 (ref 5–15)
BUN: 24 mg/dL — ABNORMAL HIGH (ref 6–20)
CO2: 24 mmol/L (ref 22–32)
Calcium: 7.9 mg/dL — ABNORMAL LOW (ref 8.9–10.3)
Chloride: 105 mmol/L (ref 98–111)
Creatinine, Ser: 2.32 mg/dL — ABNORMAL HIGH (ref 0.61–1.24)
GFR, Estimated: 33 mL/min — ABNORMAL LOW (ref >60)
Glucose, Bld: 145 mg/dL — ABNORMAL HIGH (ref 70–99)
Potassium: 4.1 mmol/L (ref 3.5–5.1)
Sodium: 136 mmol/L (ref 135–145)

## 2021-01-21 LAB — PREPARE RBC (CROSSMATCH)

## 2021-01-21 LAB — VITAMIN D 25 HYDROXY (VIT D DEFICIENCY, FRACTURES): Vit D, 25-Hydroxy: 20.03 ng/mL — ABNORMAL LOW (ref 30–100)

## 2021-01-21 LAB — TRIGLYCERIDES: Triglycerides: 146 mg/dL (ref <150)

## 2021-01-21 LAB — GLUCOSE, CAPILLARY: Glucose-Capillary: 126 mg/dL — ABNORMAL HIGH (ref 70–99)

## 2021-01-21 MED ORDER — DEXMEDETOMIDINE HCL IN NACL 400 MCG/100ML IV SOLN
0.4000 ug/kg/h | INTRAVENOUS | Status: DC
  Administered 2021-01-21: 0.6 ug/kg/h via INTRAVENOUS
  Administered 2021-01-21 – 2021-01-22 (×2): 1.2 ug/kg/h via INTRAVENOUS
  Filled 2021-01-21: qty 200
  Filled 2021-01-21: qty 100
  Filled 2021-01-21: qty 200
  Filled 2021-01-21 (×3): qty 100
  Filled 2021-01-21: qty 200
  Filled 2021-01-21: qty 100

## 2021-01-21 MED ORDER — CLONAZEPAM 0.5 MG PO TABS
0.5000 mg | ORAL_TABLET | Freq: Two times a day (BID) | ORAL | Status: DC
  Administered 2021-01-21 – 2021-01-23 (×6): 0.5 mg
  Filled 2021-01-21 (×6): qty 1

## 2021-01-21 MED ORDER — SODIUM CHLORIDE 0.9 % IV BOLUS
1000.0000 mL | Freq: Once | INTRAVENOUS | Status: AC
  Administered 2021-01-21: 1000 mL via INTRAVENOUS

## 2021-01-21 MED ORDER — QUETIAPINE FUMARATE 25 MG PO TABS
50.0000 mg | ORAL_TABLET | Freq: Two times a day (BID) | ORAL | Status: DC
  Administered 2021-01-21 – 2021-01-22 (×4): 50 mg
  Filled 2021-01-21 (×4): qty 2

## 2021-01-21 MED ORDER — SODIUM CHLORIDE 0.9% IV SOLUTION: Freq: Once | INTRAVENOUS | Status: AC

## 2021-01-21 NOTE — Progress Notes (Signed)
ORTHOPAEDIC PROGRESS NOTE  s/p Procedure(s): IRRIGATION AND DEBRIDEMENT ELBOW DISTAL BICEPS TENDON REPAIR, lateral and collateral ligament repair on 01/20/2021 with DR. Haddix  SUBJECTIVE: Sedated on vent.   OBJECTIVE: PE: General: sedated on vent LUE: Splint CDI. Unable to test neurological status due to sedation. Warm well perfused digits.   Vitals:   01/21/21 0600 01/21/21 0700  BP: (!) 123/58 124/60  Pulse: 76 76  Resp: 17 16  Temp:    SpO2: 100% 100%     ASSESSMENT: Kenneth Mcdowell is a 55 y.o. male POD#1  PLAN: Weightbearing: NWB LUE Insicional and dressing care: Dressings left intact until follow-up Orthopedic device(s): Splint VTE prophylaxis: per trauma Pain control: per trauma Follow - up plan: TBD Contact information: After hours and holidays please check [email or web address] for group call information for [facility]   Noemi Chapel, PA-C 01/21/2021

## 2021-01-21 NOTE — Progress Notes (Addendum)
Patient noted with only 20cc clear urine output s/p bolus. Clear urine noted to be leaking around cath. Flushed cath without difficulty. Bladder also noted to be distended.  Bladder scanned with 925cc noted. Dr. Kieth Brightly aware, replace Foley 16Fr.  Foley removed, patient noted with some hematuria once cath removed.  New 16 Fr Foley placed without complications.  1050cc urine return noted upon insertion.  Initially hematuria, then clear, then sediment. Now running clear again. Dr. Kieth Brightly aware.
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
## 2021-01-21 NOTE — Progress Notes (Signed)
Minimal UO via Foley. 200cc overnight, 75cc today at present. BUN/Cr trending upward today. S/p 1 unit of blood, maintence LR infusing at 100cc/hr. Dr. Kieth Brightly aware, 1L saline bolus ordered.

## 2021-01-21 NOTE — Progress Notes (Signed)
Patient ID: Kenneth Mcdowell, male   DOB: 06-Dec-1965, 55 y.o.   MRN: [id number] Patient remains heavily sedated  Reportedly moves all extremities incision clean dry and intact  Mobilize in the brace per orthopedics and trauma when patient extubated.

## 2021-01-21 NOTE — Progress Notes (Signed)
Blood consent obtained via telephone with mother Mellody Life, witnessed by second RN Leonides Sake.

## 2021-01-22 LAB — CBC
HCT: 20.1 % — ABNORMAL LOW (ref 39.0–52.0)
HCT: 26 % — ABNORMAL LOW (ref 39.0–52.0)
Hemoglobin: 6.6 g/dL — CL (ref 13.0–17.0)
Hemoglobin: 8.7 g/dL — ABNORMAL LOW (ref 13.0–17.0)
MCH: 30.7 pg (ref 26.0–34.0)
MCH: 30.6 pg (ref 26.0–34.0)
MCHC: 32.8 g/dL (ref 30.0–36.0)
MCHC: 33.5 g/dL (ref 30.0–36.0)
MCV: 93.5 fL (ref 80.0–100.0)
MCV: 91.5 fL (ref 80.0–100.0)
Platelets: 101 10*3/uL — ABNORMAL LOW (ref 150–400)
Platelets: 160 10*3/uL (ref 150–400)
RBC: 2.15 MIL/uL — ABNORMAL LOW (ref 4.22–5.81)
RBC: 2.84 MIL/uL — ABNORMAL LOW (ref 4.22–5.81)
RDW: 14.3 % (ref 11.5–15.5)
RDW: 14.1 % (ref 11.5–15.5)
WBC: 4 10*3/uL (ref 4.0–10.5)
WBC: 5.9 10*3/uL (ref 4.0–10.5)
nRBC: 0 % (ref 0.0–0.2)
nRBC: 0 % (ref 0.0–0.2)

## 2021-01-22 LAB — TYPE AND SCREEN
ABO/RH(D): A POS
Antibody Screen: NEGATIVE
Unit division: 0
Unit division: 0
Unit division: 0

## 2021-01-22 LAB — BPAM RBC
Blood Product Expiration Date: 202204062359
Blood Product Expiration Date: 202204142359
Blood Product Expiration Date: 202204142359
ISSUE DATE / TIME: 202203091633
ISSUE DATE / TIME: 202203091642
ISSUE DATE / TIME: 202203120927
Unit Type and Rh: 5100
Unit Type and Rh: 5100
Unit Type and Rh: 6200

## 2021-01-22 LAB — GLUCOSE, CAPILLARY
Glucose-Capillary: 119 mg/dL — ABNORMAL HIGH (ref 70–99)
Glucose-Capillary: 121 mg/dL — ABNORMAL HIGH (ref 70–99)
Glucose-Capillary: 122 mg/dL — ABNORMAL HIGH (ref 70–99)
Glucose-Capillary: 122 mg/dL — ABNORMAL HIGH (ref 70–99)
Glucose-Capillary: 143 mg/dL — ABNORMAL HIGH (ref 70–99)

## 2021-01-22 LAB — BASIC METABOLIC PANEL
Anion gap: 8 (ref 5–15)
BUN: 37 mg/dL — ABNORMAL HIGH (ref 6–20)
CO2: 21 mmol/L — ABNORMAL LOW (ref 22–32)
Calcium: 8 mg/dL — ABNORMAL LOW (ref 8.9–10.3)
Chloride: 111 mmol/L (ref 98–111)
Creatinine, Ser: 1.92 mg/dL — ABNORMAL HIGH (ref 0.61–1.24)
GFR, Estimated: 41 mL/min — ABNORMAL LOW (ref >60)
Glucose, Bld: 134 mg/dL — ABNORMAL HIGH (ref 70–99)
Potassium: 3.6 mmol/L (ref 3.5–5.1)
Sodium: 140 mmol/L (ref 135–145)

## 2021-01-22 LAB — TRIGLYCERIDES: Triglycerides: 121 mg/dL (ref <150)

## 2021-01-22 LAB — PREPARE RBC (CROSSMATCH)

## 2021-01-22 MED ORDER — SODIUM CHLORIDE 0.9% IV SOLUTION: Freq: Once | INTRAVENOUS | Status: AC

## 2021-01-22 MED ORDER — ORAL CARE MOUTH RINSE
15.0000 mL | Freq: Two times a day (BID) | OROMUCOSAL | Status: DC
  Administered 2021-01-22 – 2021-01-23 (×4): 15 mL via OROMUCOSAL

## 2021-01-22 MED ORDER — CHLORHEXIDINE GLUCONATE 0.12 % MT SOLN
15.0000 mL | Freq: Two times a day (BID) | OROMUCOSAL | Status: DC
  Administered 2021-01-22: 15 mL via OROMUCOSAL

## 2021-01-22 MED ORDER — FENTANYL CITRATE (PF) 100 MCG/2ML IJ SOLN
50.0000 ug | INTRAMUSCULAR | Status: DC | PRN
  Administered 2021-01-22 – 2021-01-24 (×13): 50 ug via INTRAVENOUS
  Filled 2021-01-22 (×12): qty 2
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
## 2021-01-22 NOTE — Procedures (Signed)
Extubation Procedure Note  Patient Details:   Name: Kenneth Mcdowell DOB: Jan 11, 1966 MRN: [id number]   Airway Documentation:    Vent end date: 01/22/21 Vent end time: 0852   Evaluation  O2 sats: stable throughout Complications: No apparent complications Patient did tolerate procedure well. Bilateral Breath Sounds: Clear,Diminished   Yes,  Prior to extubation, pt did have a positive cuff leak. Pt was extubated to room air. Pt tolerated well. Pt was able to state his name after extubation. No stridor noted. RT will continue to monitor pt, RN at bedside at this time.  Jorje Guild 01/22/2021, 8:53 AM

## 2021-01-22 NOTE — Progress Notes (Signed)
ORTHOPAEDIC PROGRESS NOTE  s/p Procedure(s): IRRIGATION AND DEBRIDEMENT ELBOW DISTAL BICEPS TENDON REPAIR, lateral and collateral ligament repair on 01/20/2021 with DR. Haddix  SUBJECTIVE: Sedated on vent. Responds to some commands.    OBJECTIVE: PE: General: sedated on vent LUE: Splint CDI. Able to wiggle his fingers slightly. Warm well perfused digits.   Vitals:   01/22/21 0600 01/22/21 0700  BP: (!) 111/51 (!) 121/56  Pulse: (!) 58 (!) 58  Resp: 15 17  Temp:    SpO2: 100% 100%     ASSESSMENT: Kenneth Mcdowell is a 55 y.o. male POD#2  PLAN: Weightbearing: NWB LUE Insicional and dressing care: Dressings left intact until follow-up Orthopedic device(s): Splint VTE prophylaxis: per trauma Pain control: per trauma Follow - up plan: TBD Contact information: After hours and holidays please check [email or web address] for group call information for [facility]   Noemi Chapel, PA-C 01/22/2021

## 2021-01-23 ENCOUNTER — Encounter (HOSPITAL_COMMUNITY): Payer: Self-pay | Admitting: Neurosurgery

## 2021-01-23 LAB — BASIC METABOLIC PANEL
Anion gap: 7 (ref 5–15)
BUN: 30 mg/dL — ABNORMAL HIGH (ref 6–20)
CO2: 23 mmol/L (ref 22–32)
Calcium: 8.3 mg/dL — ABNORMAL LOW (ref 8.9–10.3)
Chloride: 111 mmol/L (ref 98–111)
Creatinine, Ser: 1.11 mg/dL (ref 0.61–1.24)
GFR, Estimated: >60 mL/min (ref >60)
Glucose, Bld: 141 mg/dL — ABNORMAL HIGH (ref 70–99)
Potassium: 3.2 mmol/L — ABNORMAL LOW (ref 3.5–5.1)
Sodium: 141 mmol/L (ref 135–145)

## 2021-01-23 LAB — TYPE AND SCREEN
ABO/RH(D): A POS
Antibody Screen: NEGATIVE
Unit division: 0

## 2021-01-23 LAB — GLUCOSE, CAPILLARY
Glucose-Capillary: 132 mg/dL — ABNORMAL HIGH (ref 70–99)
Glucose-Capillary: 118 mg/dL — ABNORMAL HIGH (ref 70–99)
Glucose-Capillary: 128 mg/dL — ABNORMAL HIGH (ref 70–99)
Glucose-Capillary: 149 mg/dL — ABNORMAL HIGH (ref 70–99)

## 2021-01-23 LAB — CBC
HCT: 24 % — ABNORMAL LOW (ref 39.0–52.0)
Hemoglobin: 7.9 g/dL — ABNORMAL LOW (ref 13.0–17.0)
MCH: 30 pg (ref 26.0–34.0)
MCHC: 32.9 g/dL (ref 30.0–36.0)
MCV: 91.3 fL (ref 80.0–100.0)
Platelets: 137 10*3/uL — ABNORMAL LOW (ref 150–400)
RBC: 2.63 MIL/uL — ABNORMAL LOW (ref 4.22–5.81)
RDW: 14.3 % (ref 11.5–15.5)
WBC: 5.5 10*3/uL (ref 4.0–10.5)
nRBC: 0 % (ref 0.0–0.2)

## 2021-01-23 LAB — BPAM RBC
Blood Product Expiration Date: 202204062359
ISSUE DATE / TIME: 202203131044
Unit Type and Rh: 6200

## 2021-01-23 MED ORDER — OXYCODONE HCL 5 MG/5ML PO SOLN
10.0000 mg | ORAL | Status: DC | PRN
  Administered 2021-01-23 – 2021-01-24 (×4): 15 mg
  Filled 2021-01-23 (×5): qty 15

## 2021-01-23 MED ORDER — POTASSIUM CHLORIDE 20 MEQ PO PACK
40.0000 meq | PACK | ORAL | Status: AC
  Administered 2021-01-23 (×2): 40 meq
  Filled 2021-01-23 (×2): qty 2

## 2021-01-23 MED ORDER — ALBUTEROL SULFATE (2.5 MG/3ML) 0.083% IN NEBU: 2.5000 mg | INHALATION_SOLUTION | Freq: Four times a day (QID) | RESPIRATORY_TRACT | Status: DC | PRN

## 2021-01-23 MED ORDER — MOMETASONE FURO-FORMOTEROL FUM 200-5 MCG/ACT IN AERO
2.0000 | INHALATION_SPRAY | Freq: Two times a day (BID) | RESPIRATORY_TRACT | Status: DC
  Administered 2021-01-23 – 2021-01-25 (×5): 2 via RESPIRATORY_TRACT
  Filled 2021-01-23: qty 8.8

## 2021-01-23 MED ORDER — ALBUTEROL SULFATE HFA 108 (90 BASE) MCG/ACT IN AERS
1.0000 | INHALATION_SPRAY | Freq: Four times a day (QID) | RESPIRATORY_TRACT | Status: DC | PRN
  Administered 2021-01-24: 2 via RESPIRATORY_TRACT
  Filled 2021-01-23 (×2): qty 6.7

## 2021-01-23 MED ORDER — KETOROLAC TROMETHAMINE 15 MG/ML IJ SOLN
30.0000 mg | Freq: Four times a day (QID) | INTRAMUSCULAR | Status: AC | PRN
  Administered 2021-01-23 – 2021-01-25 (×5): 30 mg via INTRAVENOUS
  Filled 2021-01-23 (×5): qty 2

## 2021-01-23 MED ORDER — AMLODIPINE BESYLATE 5 MG PO TABS
5.0000 mg | ORAL_TABLET | Freq: Every day | ORAL | Status: DC
  Administered 2021-01-23 – 2021-01-25 (×3): 5 mg via ORAL
  Filled 2021-01-23 (×3): qty 1

## 2021-01-23 MED ORDER — METOPROLOL TARTRATE 5 MG/5ML IV SOLN
5.0000 mg | Freq: Four times a day (QID) | INTRAVENOUS | Status: AC | PRN
  Administered 2021-01-23: 5 mg via INTRAVENOUS
  Filled 2021-01-23 (×2): qty 5

## 2021-01-23 MED ORDER — QUETIAPINE FUMARATE 100 MG PO TABS
100.0000 mg | ORAL_TABLET | Freq: Two times a day (BID) | ORAL | Status: DC
  Administered 2021-01-23 (×2): 100 mg
  Filled 2021-01-23 (×2): qty 1

## 2021-01-23 NOTE — Progress Notes (Signed)
ORTHOPAEDIC PROGRESS NOTE  SUBJECTIVE: Sitting up in bed.  Was extubated yesterday morning and tolerated this well.  Notes pain in left elbow this morning.  No other issues of note currently  OBJECTIVE: PE: General: Resting in bed, c-collar in place.  No acute distress LUE: Splint CDI. Able to wiggle his fingers slightly. Warm well perfused digits.   Vitals:   01/23/21 0400 01/23/21 0600  BP: (!) 150/70 (!) 143/71  Pulse: 69 66  Resp: 18 18  Temp: 99 F (37.2 C)   SpO2: 98% 99%     ASSESSMENT: Kenneth Mcdowell is a 55 y.o. male POD#3 s/p Procedure(s): 1. IRRIGATION AND DEBRIDEMENT ELBOW 2. LEFT DISTAL BICEPS TENDON REPAIR, LATERAL AND COLLATERAL LIGAMENT REPAIR   PLAN: Weightbearing: NWB LUE Insicional and dressing care: Dressings left intact until follow-up Orthopedic device(s): Splint VTE prophylaxis: per trauma Pain control: per trauma Follow - up plan: We will continue to follow while in hospital.  Plan to remove splint from LUE and transition to hinged elbow brace on Wednesday, 01/25/2021 Contact information: Katha Hamming, MD, Patrecia Pace, PA-C  After hours and holidays please check [email or web address] for group call information for [facility]   Jarrel Knoke A. [facility] [phone] (office) [email or web address]
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
## 2021-01-23 NOTE — Progress Notes (Signed)
Subjective: Patient reports Doing well condition of back pain but legs feel good  Objective: Vital signs in last 24 hours: Temp:  [98.1 F (36.7 C)-99.1 F (37.3 C)] 98.4 F (36.9 C) (03/14 1600) Pulse Rate:  [64-94] 94 (03/14 1508) Resp:  [17-23] 21 (03/14 1508) BP: (143-197)/(59-79) 190/75 (03/14 1508) SpO2:  [94 %-99 %] 98 % (03/14 1508)  Intake/Output from previous day: 03/13 0701 - 03/14 0700 In: 1886.2 [I.V.:901.3; Blood:335; NG/GT:200; IV Piggyback:449.9] Out: 1610 [Urine:4380; Emesis/NG output:150] Intake/Output this shift: Total I/O In: 2014 [I.V.:814; NG/GT:1000; IV Piggyback:200] Out: 1500 [Urine:1500]  Strength 5/5 wound clean dry and intact  Lab Results: Recent Labs    01/22/21 1647 01/23/21 0138  WBC 5.9 5.5  HGB 8.7* 7.9*  HCT 26.0* 24.0*  PLT 160 137*   BMET Recent Labs    01/22/21 0436 01/23/21 0138  NA 140 141  K 3.6 3.2*  CL 111 111  CO2 21* 23  GLUCOSE 134* 141*  BUN 37* 30*  CREATININE 1.92* 1.11  CALCIUM 8.0* 8.3*    Studies/Results: No results found.  Assessment/Plan: Continue to mobilize with physical and occupational therapy.  Patient to be wearing the brace whenever he is out of bed or if he sitting upright for longer than 5 to 10 minutes at a time  LOS: 5 days     Elaina Hoops 01/23/2021, 5:00 PM

## 2021-01-23 NOTE — Progress Notes (Signed)
Pharmacy Electrolyte Replacement  Recent Labs:  Recent Labs    01/23/21 0138  K 3.2*  CREATININE 1.11    Low Critical Values (K </= 2.5, Phos </= 1, Mg </= 1) Present: None  MD Contacted: n/a as no critical values  Plan: KCl 19mEq per tube every 4 hours x2 doses.  Recheck K in AM
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
## 2021-01-23 NOTE — Progress Notes (Signed)
   01/23/21 1508  Vitals  BP (!) 190/75  MAP (mmHg) 105  Pulse Rate 94  ECG Heart Rate 94  Resp (!) 21  Oxygen Therapy  SpO2 98 %  MEWS Score  MEWS Temp 0  MEWS Systolic 0  MEWS Pulse 0  MEWS RR 1  MEWS LOC 0  MEWS Score 1  MEWS Score Color Green  Provider Notification  Provider Name/Title Dr A. Lovick  Date Provider Notified 01/23/21  Time Provider Notified 1510  Notification Type Face-to-face  Notification Reason Other (Comment) (pts high bp)  Provider response See new orders  Date of Provider Response 01/23/21  Time of Provider Response 1511
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
## 2021-01-24 LAB — BASIC METABOLIC PANEL
Anion gap: 9 (ref 5–15)
BUN: 26 mg/dL — ABNORMAL HIGH (ref 6–20)
CO2: 21 mmol/L — ABNORMAL LOW (ref 22–32)
Calcium: 8.6 mg/dL — ABNORMAL LOW (ref 8.9–10.3)
Chloride: 110 mmol/L (ref 98–111)
Creatinine, Ser: 0.88 mg/dL (ref 0.61–1.24)
GFR, Estimated: >60 mL/min (ref >60)
Glucose, Bld: 107 mg/dL — ABNORMAL HIGH (ref 70–99)
Potassium: 3.7 mmol/L (ref 3.5–5.1)
Sodium: 140 mmol/L (ref 135–145)

## 2021-01-24 LAB — GLUCOSE, CAPILLARY
Glucose-Capillary: 110 mg/dL — ABNORMAL HIGH (ref 70–99)
Glucose-Capillary: 115 mg/dL — ABNORMAL HIGH (ref 70–99)
Glucose-Capillary: 125 mg/dL — ABNORMAL HIGH (ref 70–99)
Glucose-Capillary: 150 mg/dL — ABNORMAL HIGH (ref 70–99)

## 2021-01-24 MED ORDER — ADULT MULTIVITAMIN W/MINERALS CH
1.0000 | ORAL_TABLET | Freq: Every day | ORAL | Status: DC
  Administered 2021-01-24 – 2021-01-25 (×2): 1 via ORAL
  Filled 2021-01-24 (×2): qty 1

## 2021-01-24 MED ORDER — QUETIAPINE FUMARATE 25 MG PO TABS
50.0000 mg | ORAL_TABLET | Freq: Two times a day (BID) | ORAL | Status: DC
  Administered 2021-01-24 – 2021-01-25 (×3): 50 mg via ORAL
  Filled 2021-01-24 (×3): qty 2

## 2021-01-24 MED ORDER — ACETAMINOPHEN 500 MG PO TABS
1000.0000 mg | ORAL_TABLET | Freq: Four times a day (QID) | ORAL | Status: AC
  Administered 2021-01-24 – 2021-01-25 (×5): 1000 mg via ORAL
  Filled 2021-01-24 (×5): qty 2

## 2021-01-24 MED ORDER — CLONAZEPAM 0.25 MG PO TBDP
0.2500 mg | ORAL_TABLET | Freq: Two times a day (BID) | ORAL | Status: DC
  Administered 2021-01-24 – 2021-01-25 (×3): 0.25 mg via ORAL
  Filled 2021-01-24 (×3): qty 1

## 2021-01-24 MED ORDER — BETHANECHOL CHLORIDE 25 MG PO TABS
25.0000 mg | ORAL_TABLET | Freq: Three times a day (TID) | ORAL | Status: DC
  Administered 2021-01-24 – 2021-01-25 (×4): 25 mg via ORAL
  Filled 2021-01-24: qty 1
  Filled 2021-01-24: qty 3
  Filled 2021-01-24 (×4): qty 1

## 2021-01-24 MED ORDER — DOCUSATE SODIUM 100 MG PO CAPS
100.0000 mg | ORAL_CAPSULE | Freq: Two times a day (BID) | ORAL | Status: DC
  Administered 2021-01-24 – 2021-01-25 (×3): 100 mg via ORAL
  Filled 2021-01-24 (×3): qty 1

## 2021-01-24 MED ORDER — FENTANYL CITRATE (PF) 100 MCG/2ML IJ SOLN
50.0000 ug | INTRAMUSCULAR | Status: AC | PRN
  Administered 2021-01-24: 50 ug via INTRAVENOUS
  Filled 2021-01-24: qty 2

## 2021-01-24 MED ORDER — OXYCODONE HCL 5 MG PO TABS
10.0000 mg | ORAL_TABLET | ORAL | Status: DC | PRN
  Administered 2021-01-24 – 2021-01-25 (×7): 15 mg via ORAL
  Filled 2021-01-24 (×7): qty 3

## 2021-01-24 MED ORDER — METHOCARBAMOL 1000 MG/10ML IJ SOLN
1000.0000 mg | Freq: Three times a day (TID) | INTRAVENOUS | Status: AC | PRN
  Filled 2021-01-24: qty 10

## 2021-01-24 MED ORDER — ENSURE MAX PROTEIN PO LIQD
11.0000 [oz_av] | Freq: Two times a day (BID) | ORAL | Status: DC
  Administered 2021-01-24: 11 [oz_av] via ORAL

## 2021-01-24 MED ORDER — TAMSULOSIN HCL 0.4 MG PO CAPS
0.4000 mg | ORAL_CAPSULE | Freq: Every day | ORAL | Status: DC
  Administered 2021-01-24 – 2021-01-25 (×2): 0.4 mg via ORAL
  Filled 2021-01-24 (×2): qty 1
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
## 2021-01-24 NOTE — Progress Notes (Signed)
Inpatient Rehab Admissions Coordinator:   Met with pt and his mother at the bedside to discuss goals and expectations of CIR stay.  We discussed likely ELOS of 2-3 weeks with goals of supervision to mod I.  Pt motivated to return to PLOF as quickly as possible and mom can provide some support at discharge, though not much physical assist.  Home is w/c accessible and one level.  We discussed estimate for self pay patient and I will provide a copy of that estimate to them tomorrow.  Can potentially admit tomorrow pending agreement to cost of care.   Shann Medal, PT, DPT Admissions Coordinator [phone] 01/24/21  3:14 PM
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
## 2021-01-25 ENCOUNTER — Encounter (HOSPITAL_COMMUNITY): Payer: Self-pay | Admitting: Physical Medicine and Rehabilitation

## 2021-01-25 ENCOUNTER — Inpatient Hospital Stay (HOSPITAL_COMMUNITY)
Admission: RE | Admit: 2021-01-25 | Discharge: 2021-01-30 | DRG: 560 | Disposition: A | Discharge status: Home Health | Payer: No Typology Code available for payment source | Source: Intra-hospital | Attending: Physical Medicine and Rehabilitation | Admitting: Physical Medicine and Rehabilitation

## 2021-01-25 ENCOUNTER — Encounter: Payer: Self-pay | Admitting: Critical Care Medicine

## 2021-01-25 ENCOUNTER — Other Ambulatory Visit: Payer: Self-pay

## 2021-01-25 DIAGNOSIS — F909 Attention-deficit hyperactivity disorder, unspecified type: Secondary | ICD-10-CM | POA: Diagnosis present

## 2021-01-25 DIAGNOSIS — T07XXXA Unspecified multiple injuries, initial encounter: Secondary | ICD-10-CM

## 2021-01-25 DIAGNOSIS — Z79899 Other long term (current) drug therapy: Secondary | ICD-10-CM

## 2021-01-25 DIAGNOSIS — F1910 Other psychoactive substance abuse, uncomplicated: Secondary | ICD-10-CM

## 2021-01-25 DIAGNOSIS — Z87828 Personal history of other (healed) physical injury and trauma: Secondary | ICD-10-CM | POA: Diagnosis present

## 2021-01-25 DIAGNOSIS — J449 Chronic obstructive pulmonary disease, unspecified: Secondary | ICD-10-CM | POA: Diagnosis present

## 2021-01-25 DIAGNOSIS — S27329D Contusion of lung, unspecified, subsequent encounter: Secondary | ICD-10-CM

## 2021-01-25 DIAGNOSIS — D62 Acute posthemorrhagic anemia: Secondary | ICD-10-CM | POA: Diagnosis present

## 2021-01-25 DIAGNOSIS — Z7951 Long term (current) use of inhaled steroids: Secondary | ICD-10-CM

## 2021-01-25 DIAGNOSIS — F319 Bipolar disorder, unspecified: Secondary | ICD-10-CM | POA: Diagnosis present

## 2021-01-25 DIAGNOSIS — S46212A Strain of muscle, fascia and tendon of other parts of biceps, left arm, initial encounter: Secondary | ICD-10-CM

## 2021-01-25 DIAGNOSIS — S61512D Laceration without foreign body of left wrist, subsequent encounter: Secondary | ICD-10-CM | POA: Diagnosis not present

## 2021-01-25 DIAGNOSIS — S42402D Unspecified fracture of lower end of left humerus, subsequent encounter for fracture with routine healing: Secondary | ICD-10-CM | POA: Diagnosis not present

## 2021-01-25 DIAGNOSIS — S32041D Stable burst fracture of fourth lumbar vertebra, subsequent encounter for fracture with routine healing: Secondary | ICD-10-CM

## 2021-01-25 DIAGNOSIS — S2242XD Multiple fractures of ribs, left side, subsequent encounter for fracture with routine healing: Secondary | ICD-10-CM | POA: Diagnosis not present

## 2021-01-25 DIAGNOSIS — K648 Other hemorrhoids: Secondary | ICD-10-CM | POA: Diagnosis present

## 2021-01-25 DIAGNOSIS — G4701 Insomnia due to medical condition: Secondary | ICD-10-CM | POA: Diagnosis present

## 2021-01-25 DIAGNOSIS — S32001A Stable burst fracture of unspecified lumbar vertebra, initial encounter for closed fracture: Secondary | ICD-10-CM | POA: Diagnosis present

## 2021-01-25 DIAGNOSIS — T1490XD Injury, unspecified, subsequent encounter: Secondary | ICD-10-CM | POA: Diagnosis present

## 2021-01-25 DIAGNOSIS — S6992XD Unspecified injury of left wrist, hand and finger(s), subsequent encounter: Secondary | ICD-10-CM

## 2021-01-25 DIAGNOSIS — R339 Retention of urine, unspecified: Secondary | ICD-10-CM

## 2021-01-25 DIAGNOSIS — F4323 Adjustment disorder with mixed anxiety and depressed mood: Secondary | ICD-10-CM | POA: Diagnosis present

## 2021-01-25 DIAGNOSIS — S2249XA Multiple fractures of ribs, unspecified side, initial encounter for closed fracture: Secondary | ICD-10-CM | POA: Diagnosis present

## 2021-01-25 DIAGNOSIS — S6990XA Unspecified injury of unspecified wrist, hand and finger(s), initial encounter: Secondary | ICD-10-CM

## 2021-01-25 MED ORDER — ENSURE MAX PROTEIN PO LIQD
11.0000 [oz_av] | Freq: Two times a day (BID) | ORAL | Status: DC
  Administered 2021-01-26 – 2021-01-29 (×7): 11 [oz_av] via ORAL

## 2021-01-25 MED ORDER — MOMETASONE FURO-FORMOTEROL FUM 200-5 MCG/ACT IN AERO
2.0000 | INHALATION_SPRAY | Freq: Two times a day (BID) | RESPIRATORY_TRACT | Status: DC
  Administered 2021-01-25 – 2021-01-29 (×9): 2 via RESPIRATORY_TRACT
  Filled 2021-01-25: qty 8.8

## 2021-01-25 MED ORDER — ONDANSETRON HCL 4 MG/2ML IJ SOLN: 4.0000 mg | Freq: Four times a day (QID) | INTRAMUSCULAR | Status: DC | PRN

## 2021-01-25 MED ORDER — METHOCARBAMOL 500 MG PO TABS
500.0000 mg | ORAL_TABLET | Freq: Three times a day (TID) | ORAL | Status: DC | PRN
  Administered 2021-01-25: 500 mg via ORAL
  Filled 2021-01-25: qty 1

## 2021-01-25 MED ORDER — ONDANSETRON 4 MG PO TBDP
4.0000 mg | ORAL_TABLET | Freq: Four times a day (QID) | ORAL | Status: DC | PRN
  Administered 2021-01-26: 4 mg via ORAL
  Filled 2021-01-25: qty 1

## 2021-01-25 MED ORDER — BETHANECHOL CHLORIDE 25 MG PO TABS
25.0000 mg | ORAL_TABLET | Freq: Three times a day (TID) | ORAL | Status: DC
  Administered 2021-01-25 – 2021-01-29 (×13): 25 mg via ORAL
  Filled 2021-01-25 (×14): qty 1

## 2021-01-25 MED ORDER — QUETIAPINE FUMARATE 50 MG PO TABS
50.0000 mg | ORAL_TABLET | Freq: Two times a day (BID) | ORAL | Status: DC
  Administered 2021-01-25 – 2021-01-27 (×4): 50 mg via ORAL
  Filled 2021-01-25 (×4): qty 1

## 2021-01-25 MED ORDER — ALBUTEROL SULFATE (2.5 MG/3ML) 0.083% IN NEBU: 2.5000 mg | INHALATION_SOLUTION | Freq: Four times a day (QID) | RESPIRATORY_TRACT | Status: DC | PRN

## 2021-01-25 MED ORDER — CLONAZEPAM 0.25 MG PO TBDP
0.2500 mg | ORAL_TABLET | Freq: Two times a day (BID) | ORAL | Status: DC
  Administered 2021-01-25 – 2021-01-26 (×2): 0.25 mg via ORAL
  Filled 2021-01-25 (×2): qty 1

## 2021-01-25 MED ORDER — AMLODIPINE BESYLATE 5 MG PO TABS
5.0000 mg | ORAL_TABLET | Freq: Every day | ORAL | Status: DC
  Administered 2021-01-26 – 2021-01-29 (×4): 5 mg via ORAL
  Filled 2021-01-25 (×5): qty 1

## 2021-01-25 MED ORDER — OXYCODONE HCL 5 MG PO TABS
10.0000 mg | ORAL_TABLET | ORAL | Status: DC | PRN
  Administered 2021-01-25: 10 mg via ORAL
  Administered 2021-01-25 – 2021-01-26 (×4): 15 mg via ORAL
  Administered 2021-01-26: 10 mg via ORAL
  Administered 2021-01-27: 15 mg via ORAL
  Administered 2021-01-27: 10 mg via ORAL
  Administered 2021-01-27 (×2): 15 mg via ORAL
  Administered 2021-01-28: 10 mg via ORAL
  Administered 2021-01-28 – 2021-01-30 (×12): 15 mg via ORAL
  Filled 2021-01-25 (×8): qty 3
  Filled 2021-01-25: qty 2
  Filled 2021-01-25 (×4): qty 3
  Filled 2021-01-25: qty 2
  Filled 2021-01-25 (×4): qty 3
  Filled 2021-01-25 (×2): qty 2
  Filled 2021-01-25 (×3): qty 3

## 2021-01-25 MED ORDER — DOCUSATE SODIUM 100 MG PO CAPS
100.0000 mg | ORAL_CAPSULE | Freq: Two times a day (BID) | ORAL | Status: DC
  Administered 2021-01-25 – 2021-01-29 (×9): 100 mg via ORAL
  Filled 2021-01-25 (×10): qty 1

## 2021-01-25 MED ORDER — ACETAMINOPHEN 325 MG PO TABS: 325.0000 mg | ORAL_TABLET | ORAL | Status: DC | PRN

## 2021-01-25 MED ORDER — ADULT MULTIVITAMIN W/MINERALS CH
1.0000 | ORAL_TABLET | Freq: Every day | ORAL | Status: DC
  Administered 2021-01-26 – 2021-01-29 (×4): 1 via ORAL
  Filled 2021-01-25 (×5): qty 1

## 2021-01-25 MED ORDER — TAMSULOSIN HCL 0.4 MG PO CAPS
0.4000 mg | ORAL_CAPSULE | Freq: Every day | ORAL | Status: DC
  Administered 2021-01-26 – 2021-01-29 (×4): 0.4 mg via ORAL
  Filled 2021-01-25 (×5): qty 1
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
## 2021-01-25 NOTE — Progress Notes (Signed)
Inpatient Rehabilitation Medication Review by a Pharmacist  A complete drug regimen review was completed for this patient to identify any potential clinically significant medication issues.  Clinically significant medication issues were identified:  no  Check AMION for pharmacist assigned to patient if future medication questions/issues arise during this admission.  Pharmacist comments:   Time spent performing this drug regimen review (minutes):  15   Kenneth Mcdowell 01/25/2021 10:00 PM

## 2021-01-25 NOTE — Progress Notes (Signed)
Patient admitted to 4MW05 with egarness of doing rehab. Sanda Linger, LPN
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
## 2021-01-25 NOTE — Progress Notes (Signed)
Met with pt this morning on rounds- pt is tearful, room is dark- does not want the blinds opened. Pt was positive when talking about rehab and physical therapy.  Rolene Arbour, RN Trauma Response Nurse

## 2021-01-25 NOTE — Plan of Care (Signed)
  Problem: Clinical Measurements: Goal: Ability to maintain clinical measurements within normal limits will improve Outcome: Progressing Goal: Will remain free from infection Outcome: Progressing   Problem: Activity: Goal: Risk for activity intolerance will decrease Outcome: Progressing   

## 2021-01-25 NOTE — Progress Notes (Signed)
Inpatient Rehab Admissions Coordinator:   I have a bed available for this patient to admit today.  Dr. Bobbye Morton in agreement.  Will meet with pt at the bedside to confirm he's okay with cost of CIR and then proceed with admit if he agrees.  Will let CM know.   Shann Medal, PT, DPT Admissions Coordinator [phone] 01/25/21  10:32 AM

## 2021-01-25 NOTE — Progress Notes (Signed)
Pt discharging to CIR/in pt rehab, room 4MW05. Report was given to RN Tamika and pt was transferred to CIR at this time with his belongings, escorted by [facility] RN Ginger.

## 2021-01-25 NOTE — Plan of Care (Signed)
Pt discharging to in patient rehab/CIR, will continue plan of care at rehab.

## 2021-01-25 NOTE — Progress Notes (Signed)
Patient ID: Kenneth Mcdowell, male   DOB: [birth year], 55 y.o.   MRN: [id number] Plate entry note:   saw the patient on rounds this morning prior to transfer to rehab was doing well condition of back pain legs feel good  Agree with transfer to rehab physical patient therapy follow-up in 2 weeks Dr. Marcello Moores
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
## 2021-01-25 NOTE — TOC Transition Note (Signed)
Transition of Care [facility]) - CM/SW Discharge Note   Patient Details  Name: Kenneth Mcdowell MRN: [id number] Date of Birth: 12-Mar-1966  [facility] CM/SW Contact:  Ella Bodo, RN Phone Number: 01/25/2021, 2:55 PM   Clinical Narrative:   Pt medically stable for discharge, and has been accepted for admission to [facility] IP Rehab today.  Plan dc to CIR when bed available.     Final next level of care: IP Rehab Facility Barriers to Discharge: Barriers Resolved   Patient Goals and CMS Choice Patient states their goals for this hospitalization and ongoing recovery are:: to go home       Discharge Plan and Services   Discharge Planning Services: CM Consult                                 Social Determinants of Health (SDOH) Interventions     Readmission Risk Interventions No flowsheet data found.  Kenneth Raddle, RN, BSN  Trauma/Neuro ICU Case Manager [phone]

## 2021-01-25 NOTE — Progress Notes (Signed)
ORTHOPAEDIC PROGRESS NOTE  SUBJECTIVE: Doing okay today, moderate pain in the left arm.  Pain medications helping some but not a ton.  Plan is for admission to CIR later today.  OBJECTIVE: Vitals:   01/25/21 0700 01/25/21 0900  BP: (!) 148/82   Pulse: 79   Resp: 18   Temp: 98.6 F (37 C)   SpO2: 98% 96%   General: Resting in bed.  No acute distress LUE: Splint removed, incisions over elbow and hand are clean, dry, intact.  Clean Mepilex dressings applied to hand and new dressing applied to the elbow.  Patient transition into hinged elbow brace.  Able to wiggle his fingers slightly.  Is able to extend the wrist the small amount actively but this is limited by pain.  Warm well perfused digits.   IMAGING: stable  ASSESSMENT: Kenneth Mcdowell is a 55 y.o. male s/p procedures:  1. I&D left elbow x 2 on 01/18/21 and 01/20/21  2. Left distal biceps tendon repair, lateral and collateral ligament repair on 01/20/21 3. Excisional debridement subcutaneous tissue and muscle left wrist by Dr. Apolonio Schneiders 01/18/21 4. Complex wound closure 16 x 5 cm wound with rotational flap by Dr. Apolonio Schneiders 01/18/21 5. Superficial branch of the radial nerve neurolysis and exploration by Dr. Apolonio Schneiders 01/18/21 6. Left dorsum of the hand excisional debridement subcutaneous tissue tendon by Dr. Apolonio Schneiders 01/18/21 7. Complex wound closure 7 x 4 cm wound dorsal of the hand by Dr. Apolonio Schneiders 01/18/21   PLAN: Weightbearing: NWB LUE. [place] for unrestricted flexion and hinged elbow brace.  Limit extension to 30 degrees Insicional and dressing care: Change PRN. Ok to leave open to air if no drainage Orthopedic device(s): Hinged elbow brace LUE VTE prophylaxis: per trauma Pain control: per trauma Follow - up plan: We will continue to follow while in hospital.  Plan for outpatient follow-up 2 weeks after hospital discharge  Contact information: Katha Hamming, MD, Patrecia Pace, PA-C  After hours and holidays please check [email or web address] for group call  information for [facility]   Adryana Mogensen A. Carmie [facility] [phone] (office) [email or web address]

## 2021-01-26 ENCOUNTER — Inpatient Hospital Stay (HOSPITAL_COMMUNITY): Payer: No Typology Code available for payment source

## 2021-01-26 DIAGNOSIS — F4323 Adjustment disorder with mixed anxiety and depressed mood: Secondary | ICD-10-CM | POA: Diagnosis present

## 2021-01-26 DIAGNOSIS — G4701 Insomnia due to medical condition: Secondary | ICD-10-CM | POA: Diagnosis present

## 2021-01-26 DIAGNOSIS — M7989 Other specified soft tissue disorders: Secondary | ICD-10-CM

## 2021-01-26 DIAGNOSIS — T07XXXA Unspecified multiple injuries, initial encounter: Secondary | ICD-10-CM

## 2021-01-26 LAB — CBC WITH DIFFERENTIAL/PLATELET
Abs Immature Granulocytes: 0.06 10*3/uL (ref 0.00–0.07)
Basophils Absolute: 0 10*3/uL (ref 0.0–0.1)
Basophils Relative: 0 %
Eosinophils Absolute: 0.1 10*3/uL (ref 0.0–0.5)
Eosinophils Relative: 2 %
HCT: 31.4 % — ABNORMAL LOW (ref 39.0–52.0)
Hemoglobin: 10.6 g/dL — ABNORMAL LOW (ref 13.0–17.0)
Immature Granulocytes: 1 %
Lymphocytes Relative: 11 %
Lymphs Abs: 0.9 10*3/uL (ref 0.7–4.0)
MCH: 30.5 pg (ref 26.0–34.0)
MCHC: 33.8 g/dL (ref 30.0–36.0)
MCV: 90.5 fL (ref 80.0–100.0)
Monocytes Absolute: 1 10*3/uL (ref 0.1–1.0)
Monocytes Relative: 11 %
Neutro Abs: 6.4 10*3/uL (ref 1.7–7.7)
Neutrophils Relative %: 75 %
Platelets: 305 10*3/uL (ref 150–400)
RBC: 3.47 MIL/uL — ABNORMAL LOW (ref 4.22–5.81)
RDW: 14.2 % (ref 11.5–15.5)
WBC: 8.5 10*3/uL (ref 4.0–10.5)
nRBC: 0 % (ref 0.0–0.2)

## 2021-01-26 LAB — COMPREHENSIVE METABOLIC PANEL
ALT: 25 U/L (ref 0–44)
AST: 23 U/L (ref 15–41)
Albumin: 3.1 g/dL — ABNORMAL LOW (ref 3.5–5.0)
Alkaline Phosphatase: 58 U/L (ref 38–126)
Anion gap: 8 (ref 5–15)
BUN: 18 mg/dL (ref 6–20)
CO2: 26 mmol/L (ref 22–32)
Calcium: 9.2 mg/dL (ref 8.9–10.3)
Chloride: 102 mmol/L (ref 98–111)
Creatinine, Ser: 0.84 mg/dL (ref 0.61–1.24)
GFR, Estimated: >60 mL/min (ref >60)
Glucose, Bld: 121 mg/dL — ABNORMAL HIGH (ref 70–99)
Potassium: 3.9 mmol/L (ref 3.5–5.1)
Sodium: 136 mmol/L (ref 135–145)
Total Bilirubin: 0.9 mg/dL (ref 0.3–1.2)
Total Protein: 6.5 g/dL (ref 6.5–8.1)

## 2021-01-26 MED ORDER — CHLORHEXIDINE GLUCONATE CLOTH 2 % EX PADS
6.0000 | MEDICATED_PAD | Freq: Every day | CUTANEOUS | Status: DC
  Administered 2021-01-26 – 2021-01-29 (×3): 6 via TOPICAL

## 2021-01-26 MED ORDER — METAXALONE 800 MG PO TABS
800.0000 mg | ORAL_TABLET | Freq: Three times a day (TID) | ORAL | Status: DC
  Administered 2021-01-26 – 2021-01-28 (×7): 800 mg via ORAL
  Filled 2021-01-26 (×9): qty 1

## 2021-01-26 MED ORDER — CLONAZEPAM 0.25 MG PO TBDP
0.5000 mg | ORAL_TABLET | Freq: Three times a day (TID) | ORAL | Status: DC
  Administered 2021-01-26 – 2021-01-29 (×11): 0.5 mg via ORAL
  Filled 2021-01-26 (×12): qty 2

## 2021-01-26 MED ORDER — TRAZODONE HCL 50 MG PO TABS
50.0000 mg | ORAL_TABLET | Freq: Every evening | ORAL | Status: DC | PRN
  Administered 2021-01-26 – 2021-01-27 (×2): 50 mg via ORAL
  Administered 2021-01-29: 100 mg via ORAL
  Filled 2021-01-26: qty 1
  Filled 2021-01-26: qty 2
  Filled 2021-01-26 (×2): qty 1
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
## 2021-01-26 NOTE — Progress Notes (Signed)
Occupational Therapy Session Note  Patient Details  Name: Kenneth Mcdowell MRN: [id number] Date of Birth: 1966/07/18  Today's Date: 01/26/2021 OT Individual Time: 6767-2094 OT Individual Time Calculation (min): 31 min    Short Term Goals: Week 1:  OT Short Term Goal 1 (Week 1): STGs = LTGs d/t ELOS  Skilled Therapeutic Interventions/Progress Updates:   Pt in bed during session reporting fatigue from PT and OT sessions earlier today.  Worked on AROM left elbow flexion/extension for 2 sets of 10 reps.  Pt with elbow flexion to approximately 90 degrees within the confines of the elbow brace.  He then worked on AROM wrist and digit extension.  He was able to demonstrate 80% of digit extension as well as 80-90 % of wrist extension.  He performed 2 sets of 10 reps for this exercises as well.  Finished with work on [employer] digit flexion as he exhibits increased swelling in the palm of the left hand.  He was able to achieve 80% of digit flexion AROM once AAROM/PROM was completed.  Pt with noted increased HR in bed 134 BPM.  Nursing made aware of situation.  Finished session with call button in reach and safety alarm in place.     Therapy Documentation Precautions:  Precautions Precautions: Fall,Back Precaution Booklet Issued: No Precaution Comments: reviewed BLT rules, log roll in and out bed and back brace use/care Required Braces or Orthoses: Sling,Spinal Brace,Other Brace Spinal Brace: Applied in sitting position,Thoracolumbosacral orthotic,Other (comment) Other Brace: hinged elbow brace, locke to avoid full extension Restrictions Weight Bearing Restrictions: Yes LUE Weight Bearing: Non weight bearing  Pain: Pain Assessment Pain Scale: Faces Pain Score: 10-Worst pain ever Faces Pain Scale: Hurts little more Pain Type: Acute pain Pain Location: Arm Pain Orientation: Left Pain Radiating Towards: arm Pain Descriptors / Indicators: Discomfort Pain Frequency: Constant Pain Onset: With  Activity Pain Intervention(s): Repositioned;Elevated extremity   Therapy/Group: Individual Therapy  MCGUIRE,JAMES OTR/L 01/26/2021, 3:47 PM
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
## 2021-01-26 NOTE — Progress Notes (Signed)
Inpatient [facility] Individual Statement of Services  Patient Name:  Kenneth Mcdowell  Date:  01/26/2021  Welcome to the [facility].  Our goal is to provide you with an individualized program based on your diagnosis and situation, designed to meet your specific needs.  With this comprehensive rehabilitation program, you will be expected to participate in at least 3 hours of rehabilitation therapies Monday-Friday, with modified therapy programming on the weekends.  Your rehabilitation program will include the following services:  Physical Therapy (PT), Occupational Therapy (OT), Speech Therapy (ST), 24 hour per day rehabilitation nursing, Therapeutic Recreaction (TR), Neuropsychology, Care Coordinator, Rehabilitation Medicine, Nutrition Services, Pharmacy Services and Other  Weekly team conferences will be held on Tuesdays to discuss your progress.  Your Inpatient Rehabilitation Care Coordinator will talk with you frequently to get your input and to update you on team discussions.  Team conferences with you and your family in attendance may also be held.  Expected length of stay: 12-16 Days  Overall anticipated outcome: MOD I to Supervision  Depending on your progress and recovery, your program may change. Your Inpatient Rehabilitation Care Coordinator will coordinate services and will keep you informed of any changes. Your Inpatient Rehabilitation Care Coordinator's name and contact numbers are listed  below.  The following services may also be recommended but are not provided by the [facility]:    [facility] will be made to provide these services after discharge if needed.  Arrangements include referral to agencies that provide these services.  Your insurance has been verified to be:  uninsured Your primary doctor is:  NO PCP  Pertinent information will be  shared with your doctor and your insurance company.  Inpatient Rehabilitation Care Coordinator:  Erlene Quan, [place] or [phone]  Information discussed with and copy given to patient by: Dyanne Iha, 01/26/2021, 12:41 PM
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
## 2021-01-26 NOTE — Progress Notes (Signed)
Occupational Therapy Session Note  Patient Details  Name: Kenneth Mcdowell MRN: [id number] Date of Birth: [birth year]  Today's Date: 01/26/2021 OT Individual Time: 8127-5170 OT Individual Time Calculation (min): 57 min    Short Term Goals: Week 1:  OT Short Term Goal 1 (Week 1): STGs = LTGs d/t ELOS  Skilled Therapeutic Interventions/Progress Updates:  Pt greeted at time of session supine in bed resting with father present, SW as well. After SW and father left, pt agreeable to OOB activity. Pain still present but much better than earlier session. Supine > sit Min/Mod A within back precautions. Donned brace EOB with Max A but pt able to sit with BUE support and no therapist assist. Donned pants Mod A, reviewed back precautions and therapist assist to thread and sit > stand Mod A for pt to partially don over hips before partial stand/squat pivot to wheelchair. Transported throughout unit as this is patient's first time out of room. Self propel partially to/from gym for carryover to home use, BLE and RUE propulsion, Supervision. In gym sit <> stands x2 with Mod A and HHA, mirror for feedback and able to stand first for 20 seconds and second trial 1 minute. Discussion throughout session for DC planning, help from mom/family/friends, and DC planning. Pt wanting to leave within 1 week and really pushing himself. Note pt to be in pain throughout session but wanted to push through. 9/10 pain back in room, nursing aware and going to provide med pass. Partial stand pivot w/c > bed with use of hand rail Mod A. Brace removed, sit > supine Max A. Alarm on call bell in reach.    Therapy Documentation Precautions:  Precautions Precautions: Fall,Back Precaution Comments: reviewed BLT rules, log roll in and out bed and back brace use/care Required Braces or Orthoses: Sling,Spinal Brace,Other Brace Spinal Brace: Applied in sitting position,Thoracolumbosacral orthotic,Other (comment) Other Brace: hinged elbow  brace, locke to avoid full extension Restrictions Weight Bearing Restrictions: Yes LUE Weight Bearing: Non weight bearing     Therapy/Group: Individual Therapy  Viona Gilmore 01/26/2021, 2:02 PM

## 2021-01-26 NOTE — Progress Notes (Signed)
Pt is requesting something to help him sleep at 0200. Advised Pt that he was given Seroquel and oxy for pain several hours ago, and he is not prescribed anything else for sleep and to speak w/ Dr. In AM about prescribing something else to help him sleep.

## 2021-01-26 NOTE — Progress Notes (Signed)
Inpatient Rehabilitation  Patient information reviewed and entered into eRehab system by Melissa M. Bowie, M.A., CCC/SLP, PPS Coordinator.  Information including medical coding, functional ability and quality indicators will be reviewed and updated through discharge.    
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
## 2021-01-26 NOTE — Progress Notes (Signed)
Patient ID: Kenneth Mcdowell, male   DOB: 10/16/1966, 55 y.o.   MRN: [id number]   SW spoke to patient mother, introduced self, explained role and process moving forward. No additional questions or concerns, SW will continue to follow up.  [place], [place]

## 2021-01-26 NOTE — Plan of Care (Signed)
  Problem: RH Balance Goal: LTG: Patient will maintain dynamic sitting balance (OT) Description: LTG:  Patient will maintain dynamic sitting balance with assistance during activities of daily living (OT) Flowsheets (Taken 01/26/2021 1252) LTG: Pt will maintain dynamic sitting balance during ADLs with: Supervision/Verbal cueing Goal: LTG Patient will maintain dynamic standing with ADLs (OT) Description: LTG:  Patient will maintain dynamic standing balance with assist during activities of daily living (OT)  Flowsheets (Taken 01/26/2021 1252) LTG: Pt will maintain dynamic standing balance during ADLs with: Contact Guard/Touching assist   Problem: Sit to Stand Goal: LTG:  Patient will perform sit to stand in prep for activites of daily living with assistance level (OT) Description: LTG:  Patient will perform sit to stand in prep for activites of daily living with assistance level (OT) Flowsheets (Taken 01/26/2021 1252) LTG: PT will perform sit to stand in prep for activites of daily living with assistance level: Contact Guard/Touching assist   Problem: RH Eating Goal: LTG Patient will perform eating w/assist, cues/equip (OT) Description: LTG: Patient will perform eating with assist, with/without cues using equipment (OT) Flowsheets (Taken 01/26/2021 1252) LTG: Pt will perform eating with assistance level of: Independent with assistive device    Problem: RH Grooming Goal: LTG Patient will perform grooming w/assist,cues/equip (OT) Description: LTG: Patient will perform grooming with assist, with/without cues using equipment (OT) Flowsheets (Taken 01/26/2021 1252) LTG: Pt will perform grooming with assistance level of: Set up assist    Problem: RH Bathing Goal: LTG Patient will bathe all body parts with assist levels (OT) Description: LTG: Patient will bathe all body parts with assist levels (OT) Flowsheets (Taken 01/26/2021 1252) LTG: Pt will perform bathing with assistance level/cueing: Minimal  Assistance - Patient > 75%   Problem: RH Dressing Goal: LTG Patient will perform upper body dressing (OT) Description: LTG Patient will perform upper body dressing with assist, with/without cues (OT). Flowsheets (Taken 01/26/2021 1252) LTG: Pt will perform upper body dressing with assistance level of: Minimal Assistance - Patient > 75% Goal: LTG Patient will perform lower body dressing w/assist (OT) Description: LTG: Patient will perform lower body dressing with assist, with/without cues in positioning using equipment (OT) Flowsheets (Taken 01/26/2021 1252) LTG: Pt will perform lower body dressing with assistance level of: Minimal Assistance - Patient > 75%   Problem: RH Toileting Goal: LTG Patient will perform toileting task (3/3 steps) with assistance level (OT) Description: LTG: Patient will perform toileting task (3/3 steps) with assistance level (OT)  Flowsheets (Taken 01/26/2021 1252) LTG: Pt will perform toileting task (3/3 steps) with assistance level: Minimal Assistance - Patient > 75%   Problem: RH Toilet Transfers Goal: LTG Patient will perform toilet transfers w/assist (OT) Description: LTG: Patient will perform toilet transfers with assist, with/without cues using equipment (OT) Flowsheets (Taken 01/26/2021 1252) LTG: Pt will perform toilet transfers with assistance level of: Contact Guard/Touching assist

## 2021-01-27 MED ORDER — QUETIAPINE FUMARATE 25 MG PO TABS
25.0000 mg | ORAL_TABLET | Freq: Two times a day (BID) | ORAL | Status: DC
  Administered 2021-01-27 – 2021-01-29 (×5): 25 mg via ORAL
  Filled 2021-01-27 (×6): qty 1

## 2021-01-27 NOTE — Plan of Care (Signed)
  Problem: Consults Goal: RH GENERAL PATIENT EDUCATION Description: See Patient Education module for education specifics. Outcome: Progressing Goal: Skin Care Protocol Initiated - if Braden Score 18 or less Description: If consults are not indicated, leave blank or document N/A Outcome: Progressing Goal: Nutrition Consult-if indicated Outcome: Progressing   Problem: RH BOWEL ELIMINATION Goal: RH STG MANAGE BOWEL WITH ASSISTANCE Description: STG Manage Bowel with Mod I Assistance. Outcome: Progressing Goal: RH STG MANAGE BOWEL W/MEDICATION W/ASSISTANCE Description: STG Manage Bowel with Medication with Mod I Assistance. Outcome: Progressing   Problem: RH BLADDER ELIMINATION Goal: RH STG MANAGE BLADDER WITH ASSISTANCE Description: STG Manage Bladder With Mod I Assistance Outcome: Progressing Goal: RH STG MANAGE BLADDER WITH MEDICATION WITH ASSISTANCE Description: STG Manage Bladder With Medication With Mod I Assistance. Outcome: Progressing   Problem: RH SKIN INTEGRITY Goal: RH STG MAINTAIN SKIN INTEGRITY WITH ASSISTANCE Description: STG Maintain Skin Integrity With Mod I Assistance. Outcome: Progressing Goal: RH STG ABLE TO PERFORM INCISION/WOUND CARE W/ASSISTANCE Description: STG Able To Perform Incision/Wound Care With Mod I Assistance. Outcome: Progressing   Problem: RH SAFETY Goal: RH STG ADHERE TO SAFETY PRECAUTIONS W/ASSISTANCE/DEVICE Description: STG Adhere to Safety Precautions With Mod I Assistance/Device. Outcome: Progressing Goal: RH STG DECREASED RISK OF FALL WITH ASSISTANCE Description: STG Decreased Risk of Fall With Mod I Assistance. Outcome: Progressing   Problem: RH PAIN MANAGEMENT Goal: RH STG PAIN MANAGED AT OR BELOW PT'S PAIN GOAL Description: < 4 on a 0-10 pain scale. Outcome: Progressing   Problem: RH KNOWLEDGE DEFICIT GENERAL Goal: RH STG INCREASE KNOWLEDGE OF SELF CARE AFTER HOSPITALIZATION Description: Patient will be able to demonstrate  knowledge of medication management, pain management, bowel/bladder management, skin/wound care, weight bearing precautions with educational materials and handouts provided by staff. Outcome: Progressing

## 2021-01-27 NOTE — Progress Notes (Signed)
IV removed from URE.
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
## 2021-01-27 NOTE — Progress Notes (Signed)
Physical Therapy Session Note  Patient Details  Name: Kenneth Mcdowell MRN: [id number] Date of Birth: 05-21-1966  Today's Date: 01/27/2021 PT Individual Time: 4098-1191 and 1445-1530  PT Individual Time Calculation (min): 56 min and 45 mins  Short Term Goals: Week 1:  PT Short Term Goal 1 (Week 1): pt to demonstrate supine<>sit CGA within back precautions PT Short Term Goal 2 (Week 1): pt to demonstrate CGA for transfers with LRAD PT Short Term Goal 3 (Week 1): pt to demonstrate ambulation 50' min A with LRAD PT Short Term Goal 4 (Week 1): pt recall 3/3 back precautions  Skilled Therapeutic Interventions/Progress Updates:    pt received in sitting in stedy in restroom and agreeable to therapy. Pt reported he felt increased pain and fatigue and requested to remain in room for session. 9/10 in back reported and that he had already had pain meds. PT assisted in bringing pt in stedy into room, setup for transfer to sitting EOB. Pt min A for stand in stedy, directed in standing for 45s CGA then transferred to sitting EOB. Pt sat EOB for 8 mins with VC for breathing technique to decrease pain levels, and promote sitting balance. Pt then directed in sit>supine at mod A for BLE management, greatly increased pain with this and additional VC for breathing technique which improved. Pt then directed in supine exercises of x30 ankle pumps, x20 knees to chest, hip abduction/adduction, glute squeezes, clam shells, leg lifts all with VC for breathing technique, exercises technique, and decreased speed of activity for improved strength gains. Pt educated on safety with return to home, goals of PT and progression during stay. Pt very motivated to participate and apologetic  for not able to "do more" however pt very limited with pain management this AM session.   Session 2: pt received in bed and agreeable to therapy. Pt directed in supine>sit to pt's L with extra time to complete with safety of LUE with pt completing  with limited pain at light min A. TLSO donned in sitting max A . Pt then directed in Sit to stand min A for safety, with gait belt. Pt then directed in [place] mobility with BLE and RUE supervision 150'x2. Pt then directed in gait training with Rolling walker 60' +70' +150' + 100'. Pt then returned to room, transfer Stand pivot transfer to bedside CGA, doffed TLSO max A, transferred sit>supine min A for LE management. Pt left in bed, All needs in reach and in good condition. Call light in hand.  And alarm set.   Therapy Documentation Precautions:  Precautions Precautions: Fall,Back Precaution Booklet Issued: No Precaution Comments: reviewed BLT rules, log roll in and out bed and back brace use/care Required Braces or Orthoses: Sling,Spinal Brace,Other Brace Spinal Brace: Applied in sitting position,Thoracolumbosacral orthotic,Other (comment) Other Brace: hinged elbow brace, locke to avoid full extension Restrictions Weight Bearing Restrictions: Yes LUE Weight Bearing: Non weight bearing General:   Vital Signs:  Pain: Pain Assessment Pain Score: 9  Faces Pain Scale: Hurts little more Mobility:   Locomotion :    Trunk/Postural Assessment :    Balance:   Exercises:   Other Treatments:      Therapy/Group: Individual Therapy  Junie Panning 01/27/2021, 12:19 PM
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
## 2021-01-27 NOTE — Progress Notes (Signed)
Occupational Therapy Session Note  Patient Details  Name: Kenneth Mcdowell MRN: [id number] Date of Birth: 1966-09-26  Today's Date: 01/27/2021 OT Individual Time: 6010-9323 OT Individual Time Calculation (min): 62 min   Short Term Goals: Week 1:  OT Short Term Goal 1 (Week 1): STGs = LTGs d/t ELOS  Skilled Therapeutic Interventions/Progress Updates:    Pt greeted in bed and agreeable to session. Started by d/c planning. Pt reports that he has friends that are presently installing grab bars near toilet and shower at home. His bathroom is w/c accessible and he already has an elevated toilet seat as well as a TTB for the tub shower. We also discussed bed mobility with pt reporting that he has to sit up on his Lt side due to bed being against the wall. Set pt up with flat bed without bedrail, per home setup, and discussed that this is not safe given his back and Lt UE precautions. Pt reported he may have a friend install a trapeze over his bed or just use his recliner with an adjustable back. He also has several large dogs at home that sleep with him. We discussed sleeping safety and being able to command dogs to stay on his Rt side at all times. Supine<sit completed with Mod A, rising on the Rt side. Pt doffed his scrub shirt unassisted and then donned a clean shirt with increased time and Min A due to his Lt hinged elbow brace. Once his TLSO was donned, pt stood with Min A and no AD to lower pants, pt found to have a soiled bed. Per pt, this is from his hemorrhoids. Pt required Mod A for thorough perihygiene completion due to his inability to twist. Though OT brought in the reacher to trial, pt reported being in too much pain at this point to attempt. Max A for LB dressing while RN arrived to change his bed with clean linen. Pt returned to bed via logroll given Mod A once his TLSO was doffed. He remained in bed at close of session, left with all needs within reach. We discussed using music as a holistic  means for pain mgt. He was listening to his favorite music at time of OT departure. RN also asked to provide him with scheduled pain medicine.   Therapy Documentation Precautions:  Precautions Precautions: Fall,Back Precaution Booklet Issued: No Precaution Comments: reviewed BLT rules, log roll in and out bed and back brace use/care Required Braces or Orthoses: Sling,Spinal Brace,Other Brace Spinal Brace: Applied in sitting position,Thoracolumbosacral orthotic,Other (comment) Other Brace: hinged elbow brace, locke to avoid full extension Restrictions Weight Bearing Restrictions: Yes LUE Weight Bearing: Non weight bearing Pain: Pain Assessment Pain Score: 9  Faces Pain Scale: Hurts little more ADL: ADL Eating: Not assessed Grooming: Not assessed Upper Body Bathing: Moderate assistance Where Assessed-Upper Body Bathing: Bed level Lower Body Bathing: Dependent Upper Body Dressing: Maximal assistance Where Assessed-Upper Body Dressing: Bed level Lower Body Dressing: Not assessed Toileting: Not assessed Toilet Transfer: Not assessed Tub/Shower Transfer: Not assessed      Therapy/Group: Individual Therapy  Jayshaun Phillips A Jaklyn Alen 01/27/2021, 12:42 PM

## 2021-01-27 NOTE — Progress Notes (Signed)
Occupational Therapy Session Note  Patient Details  Name: Kenneth Mcdowell MRN: [id number] Date of Birth: [birth year]  Today's Date: 01/27/2021 OT Individual Time: 1400-1445 OT Individual Time Calculation (min): 45 min    Short Term Goals: Week 1:  OT Short Term Goal 1 (Week 1): STGs = LTGs d/t ELOS      Skilled Therapeutic Interventions/Progress Updates:    Pt greeted in bed, asleep, easily woken. He reported that his pain was "9.5/10," requesting for bedlevel therapy because he had a PT session right after this OT session. Therefore session focus was placed on AE training. Pt able to thread LEs into pants x4 using the reacher while adhering to back precautions. He was able to doff gripper socks using reacher also but ultimately needed assist to don gripper socks (though pt tried to do this at Mod I level using reacher initially). Pt able to direct care for guiding caregiver in regards to donning his TLSO x2. We discussed using active assist ROM techniques for his Lt hand due to pain with active ROM. Pt exhibiting numbness along ulnar distribution of the Lt hand, also some numbness in areas of the thumb. Pt left via PT handoff.   Therapy Documentation Precautions:  Precautions Precautions: Fall,Back Precaution Booklet Issued: No Precaution Comments: reviewed BLT rules, log roll in and out bed and back brace use/care Required Braces or Orthoses: Sling,Spinal Brace,Other Brace Spinal Brace: Applied in sitting position,Thoracolumbosacral orthotic,Other (comment) Other Brace: hinged elbow brace, locke to avoid full extension Restrictions Weight Bearing Restrictions: Yes LUE Weight Bearing: Non weight bearing Pain: Pain Assessment Pain Score: 5  Faces Pain Scale: Hurts little more ADL: ADL Eating: Not assessed Grooming: Not assessed Upper Body Bathing: Moderate assistance Where Assessed-Upper Body Bathing: Bed level Lower Body Bathing: Dependent Upper Body Dressing: Maximal  assistance Where Assessed-Upper Body Dressing: Bed level Lower Body Dressing: Not assessed Toileting: Not assessed Toilet Transfer: Not assessed Tub/Shower Transfer: Not assessed      Therapy/Group: Individual Therapy  Dannia Snook A Jamesyn Lindell 01/27/2021, 3:58 PM

## 2021-01-28 DIAGNOSIS — G4701 Insomnia due to medical condition: Secondary | ICD-10-CM

## 2021-01-28 DIAGNOSIS — S32001S Stable burst fracture of unspecified lumbar vertebra, sequela: Secondary | ICD-10-CM

## 2021-01-28 MED ORDER — METAXALONE 400 MG HALF TABLET
400.0000 mg | ORAL_TABLET | Freq: Three times a day (TID) | ORAL | Status: DC | PRN
  Filled 2021-01-28: qty 1

## 2021-01-28 MED ORDER — TIZANIDINE HCL 2 MG PO TABS
2.0000 mg | ORAL_TABLET | Freq: Three times a day (TID) | ORAL | Status: DC
  Administered 2021-01-28 (×3): 2 mg via ORAL
  Filled 2021-01-28 (×3): qty 1

## 2021-01-28 NOTE — Plan of Care (Signed)
  Problem: Consults Goal: RH GENERAL PATIENT EDUCATION Description: See Patient Education module for education specifics. Outcome: Progressing   Problem: Consults Goal: RH GENERAL PATIENT EDUCATION Description: See Patient Education module for education specifics. Outcome: Progressing Goal: Skin Care Protocol Initiated - if Braden Score 18 or less Description: If consults are not indicated, leave blank or document N/A Outcome: Progressing Goal: Nutrition Consult-if indicated Outcome: Progressing   Problem: RH BOWEL ELIMINATION Goal: RH STG MANAGE BOWEL WITH ASSISTANCE Description: STG Manage Bowel with Mod I Assistance. Outcome: Progressing Goal: RH STG MANAGE BOWEL W/MEDICATION W/ASSISTANCE Description: STG Manage Bowel with Medication with Mod I Assistance. Outcome: Progressing   Problem: RH BLADDER ELIMINATION Goal: RH STG MANAGE BLADDER WITH ASSISTANCE Description: STG Manage Bladder With Mod I Assistance Outcome: Progressing Goal: RH STG MANAGE BLADDER WITH MEDICATION WITH ASSISTANCE Description: STG Manage Bladder With Medication With Mod I Assistance. Outcome: Progressing   Problem: RH SKIN INTEGRITY Goal: RH STG MAINTAIN SKIN INTEGRITY WITH ASSISTANCE Description: STG Maintain Skin Integrity With Mod I Assistance. Outcome: Progressing Goal: RH STG ABLE TO PERFORM INCISION/WOUND CARE W/ASSISTANCE Description: STG Able To Perform Incision/Wound Care With Mod I Assistance. Outcome: Progressing   Problem: RH SAFETY Goal: RH STG ADHERE TO SAFETY PRECAUTIONS W/ASSISTANCE/DEVICE Description: STG Adhere to Safety Precautions With Mod I Assistance/Device. Outcome: Progressing Goal: RH STG DECREASED RISK OF FALL WITH ASSISTANCE Description: STG Decreased Risk of Fall With Mod I Assistance. Outcome: Progressing   Problem: RH PAIN MANAGEMENT Goal: RH STG PAIN MANAGED AT OR BELOW PT'S PAIN GOAL Description: < 4 on a 0-10 pain scale. Outcome: Progressing   Problem: RH  KNOWLEDGE DEFICIT GENERAL Goal: RH STG INCREASE KNOWLEDGE OF SELF CARE AFTER HOSPITALIZATION Description: Patient will be able to demonstrate knowledge of medication management, pain management, bowel/bladder management, skin/wound care, weight bearing precautions with educational materials and handouts provided by staff. Outcome: Progressing
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
## 2021-01-29 MED ORDER — TIZANIDINE HCL 2 MG PO TABS
4.0000 mg | ORAL_TABLET | Freq: Three times a day (TID) | ORAL | Status: DC
  Administered 2021-01-29 (×3): 4 mg via ORAL
  Filled 2021-01-29 (×4): qty 2

## 2021-01-29 MED ORDER — MUSCLE RUB 10-15 % EX CREA
TOPICAL_CREAM | Freq: Three times a day (TID) | CUTANEOUS | Status: DC
  Filled 2021-01-29: qty 85

## 2021-01-29 NOTE — Progress Notes (Signed)
Physical Therapy Session Note  Patient Details  Name: RUMALDO DIFATTA MRN: [id number] Date of Birth: 08/17/1966  Today's Date: 01/29/2021 PT Individual Time: 0730-0829 PT Individual Time Calculation (min): 59 min   Short Term Goals: Week 1:  PT Short Term Goal 1 (Week 1): pt to demonstrate supine<>sit CGA within back precautions PT Short Term Goal 2 (Week 1): pt to demonstrate CGA for transfers with LRAD PT Short Term Goal 3 (Week 1): pt to demonstrate ambulation 50' min A with LRAD PT Short Term Goal 4 (Week 1): pt recall 3/3 back precautions     Skilled Therapeutic Interventions/Progress Updates:    pt received in bed and agreeable to therapy. Pt reported 3/10 pain at start of session and 8/10 at end of session. Pt directed in supine>sit CGA to pt's L with log rolling technique and max A for donning TLSO at bedside. Pt then directed in Sit to stand no AD CGA. Pt then directed in gait training without AD CGA, gait belt for safety for 150' VC for core activation for improved stability. Pt directed in car transfer SBA without need of cues with good technique noted to exit and enter. Pt directed in gait training no AD CGA for 100' VC for trunk extension and decreased trunk sway with good improvement. Pt then directed in stair training with ascending/descending  x4 6" steps and multiple 3" steps with R hand rail only at SBA reciprocal pattern noted. Pt directed in gait training to return to room at end of session 200' total CGA for stability reporting increased pain levels in R shoulder and back and shortness of breath but improves post rest break. Pt directed in transferring to bed at end of gait, SBA and SBA sit>supine.pt educated on safety with return to home with recommended equipment with cane use quad cane vs. Single point cane pending progress and potentially a WC for community use as pt does have h/o COPD which limits distance of ambulation. Pt agreeable to all recommendations. Pt also educated  on safety with removal of tripping hazards at home, having dogs in a different room when you enter home to decrease fall risk and reintroduce them one at a time for limited fall risk. Pt agreeable. Pt also reports when his mother can't be home he will have a friend to stay with him during those times. Pt left in bed, All needs in reach and in good condition. Call light in hand.  And alarm set. Pt required intermittent rest breaks 2/2 fatigue and pain levels but overall greatly improving with therapy and remains very motivated to participate.  Therapy Documentation Precautions:  Precautions Precautions: Fall,Back Precaution Booklet Issued: No Precaution Comments: reviewed BLT rules, log roll in and out bed and back brace use/care Required Braces or Orthoses: Sling,Spinal Brace,Other Brace Spinal Brace: Applied in sitting position,Thoracolumbosacral orthotic,Other (comment) Other Brace: hinged elbow brace, locke to avoid full extension Restrictions Weight Bearing Restrictions: Yes LUE Weight Bearing: Non weight bearing General: PT Amount of Missed Time (min): 45 Minutes PT Missed Treatment Reason: Patient unwilling to participate;Pain Vital Signs: Therapy Vitals Temp: 98.5 F (36.9 C) Temp Source: Oral Pulse Rate: 84 Resp: 16 BP: 121/72 Patient Position (if appropriate): Lying Oxygen Therapy SpO2: 99 % O2 Device: Room Air Pain:   Mobility:   Locomotion :    Trunk/Postural Assessment :    Balance:   Exercises:   Other Treatments:      Therapy/Group: Individual Therapy  Junie Panning 01/29/2021, 3:26 PM

## 2021-01-29 NOTE — Progress Notes (Signed)
Occupational Therapy Session Note  Patient Details  Name: Kenneth Mcdowell MRN: [id number] Date of Birth: Sep 20, 1966  Today's Date: 01/29/2021 OT Group Time:  - 60 minutes missed    Skilled Therapeutic Interventions/Progress Updates:    Pt declined participation in group today due to heightened pain. Pt resting with hot packs. He stated that RN was already aware of his pain and that he was medicated for pain.    Therapy Documentation Precautions:  Precautions Precautions: Fall,Back Precaution Booklet Issued: No Precaution Comments: reviewed BLT rules, log roll in and out bed and back brace use/care Required Braces or Orthoses: Sling,Spinal Brace,Other Brace Spinal Brace: Applied in sitting position,Thoracolumbosacral orthotic,Other (comment) Other Brace: hinged elbow brace, locke to avoid full extension Restrictions Weight Bearing Restrictions: Yes LUE Weight Bearing: Non weight bearing Vital Signs: Therapy Vitals Temp: 97.9 F (36.6 C) Pulse Rate: (!) 109 Resp: 20 BP: 130/81 Patient Position (if appropriate): Lying Oxygen Therapy SpO2: 100 % O2 Device: Room Air Pain: Pain Assessment Pain Scale: 0-10 Pain Score: 6  Faces Pain Scale: Hurts little more Pain Type: Acute pain Pain Location: Back Pain Orientation: Mid;Lower Pain Radiating Towards: arm Pain Descriptors / Indicators: Aching;Burning Pain Frequency: Constant Pain Onset: On-going Patients Stated Pain Goal: 4 Pain Intervention(s): Medication (See eMAR) ADL: ADL Eating: Not assessed Grooming: Not assessed Upper Body Bathing: Moderate assistance Where Assessed-Upper Body Bathing: Bed level Lower Body Bathing: Dependent Upper Body Dressing: Maximal assistance Where Assessed-Upper Body Dressing: Bed level Lower Body Dressing: Not assessed Toileting: Not assessed Toilet Transfer: Not assessed Tub/Shower Transfer: Not assessed      Therapy/Group: Group Therapy  Giordana Weinheimer A Niurka Benecke 01/29/2021, 7:24  AM
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
## 2021-01-29 NOTE — Progress Notes (Signed)
Occupational Therapy Session Note  Patient Details  Name: Kenneth Mcdowell MRN: [id number] Date of Birth: 02-23-1966  Today's Date: 01/29/2021 OT Individual Time: 2500-3704 OT Individual Time Calculation (min): 29 min  and Today's Date: 01/29/2021 OT Missed Time: 16 Minutes Missed Time Reason: Pain   Short Term Goals: Week 1:  OT Short Term Goal 1 (Week 1): STGs = LTGs d/t ELOS   Skilled Therapeutic Interventions/Progress Updates:    Pt greeted at time of session sitting EOB no brace crying from back pain. Emotional support provided and spoke with NT, staff aware and trying to locate K pad for relief. Pain meds not due for approx another 30-45 mins. Provided positional changes and moist heat in attempt to relieve pain. Trialed sitting EOB with moist heat on R shoulder and low back with posteriorly supported sitting, supine with BLEs elevated and neutral spine. Pt noted some relief in these positions with pain ultimately returning. Soft tissue massage as well for scap/rhomboid/trap area with some relief noted. Pt in supine with BLEs elevated on pillows, LUE elevated as well, moist heat on shoulder with skin in tact pre and post. Call bell in reach alarm on.   Therapy Documentation Precautions:  Precautions Precautions: Fall,Back Precaution Booklet Issued: No Precaution Comments: reviewed BLT rules, log roll in and out bed and back brace use/care Required Braces or Orthoses: Sling,Spinal Brace,Other Brace Spinal Brace: Applied in sitting position,Thoracolumbosacral orthotic,Other (comment) Other Brace: hinged elbow brace, locke to avoid full extension Restrictions Weight Bearing Restrictions: Yes LUE Weight Bearing: Non weight bearing      Therapy/Group: Individual Therapy  Viona Gilmore 01/29/2021, 7:21 AM

## 2021-01-29 NOTE — Progress Notes (Signed)
Physical Therapy Session Note  Patient Details  Name: Kenneth Mcdowell MRN: [id number] Date of Birth: [birth year]  Pt pleasantly deferring therapy at this time - reports he is in too much pain (shoulder, back, legs) and has already done his therapy for the day. He reports he "walked over 444ft, went up/down steps, and in and out of the car." He reports he isn't due for any pan medication until 1400 (PT arrived at 1300). Unable to encourage patient to work through pain. He missed 45 minutes of skilled therapy.  General: PT Amount of Missed Time (min): 45 Minutes PT Missed Treatment Reason: Patient unwilling to participate;Pain  Therapy/Group: Individual Therapy  Joee Iovine P Damel Querry PT 01/29/2021, 12:32 PM

## 2021-01-30 ENCOUNTER — Other Ambulatory Visit: Payer: Self-pay | Admitting: Physician Assistant

## 2021-01-30 MED ORDER — TIZANIDINE HCL 4 MG PO TABS: 4.0000 mg | ORAL_TABLET | Freq: Three times a day (TID) | ORAL | 0 refills | Status: DC

## 2021-01-30 MED ORDER — AMLODIPINE BESYLATE 5 MG PO TABS: 5.0000 mg | ORAL_TABLET | Freq: Every day | ORAL | 0 refills | Status: DC

## 2021-01-30 MED ORDER — ACETAMINOPHEN 325 MG PO TABS: 325.0000 mg | ORAL_TABLET | ORAL | Status: DC | PRN

## 2021-01-30 MED ORDER — METAXALONE 400 MG PO TABS: 400.0000 mg | ORAL_TABLET | Freq: Three times a day (TID) | ORAL | 0 refills | Status: DC | PRN

## 2021-01-30 MED ORDER — TAMSULOSIN HCL 0.4 MG PO CAPS: 0.4000 mg | ORAL_CAPSULE | Freq: Every day | ORAL | 0 refills | Status: DC

## 2021-01-30 MED ORDER — BETHANECHOL CHLORIDE 25 MG PO TABS: 25.0000 mg | ORAL_TABLET | Freq: Three times a day (TID) | ORAL | 0 refills | Status: DC

## 2021-01-30 MED ORDER — ADULT MULTIVITAMIN W/MINERALS CH: 1.0000 | ORAL_TABLET | Freq: Every day | ORAL | Status: DC

## 2021-01-30 MED ORDER — TRAZODONE HCL 50 MG PO TABS: 50.0000 mg | ORAL_TABLET | Freq: Every evening | ORAL | 0 refills | Status: DC | PRN

## 2021-01-30 NOTE — Progress Notes (Signed)
Physical Therapy Session Note  Patient Details  Name: Kenneth Mcdowell MRN: [id number] Date of Birth: 09-Dec-1965  Today's Date: 01/30/2021 PT Individual Time:  -      Short Term Goals: Week 1:  PT Short Term Goal 1 (Week 1): pt to demonstrate supine<>sit CGA within back precautions PT Short Term Goal 2 (Week 1): pt to demonstrate CGA for transfers with LRAD PT Short Term Goal 3 (Week 1): pt to demonstrate ambulation 50' min A with LRAD PT Short Term Goal 4 (Week 1): pt recall 3/3 back precautions Week 2:     Skilled Therapeutic Interventions/Progress Updates:      Therapy Documentation Precautions:  Precautions Precautions: Fall,Back Precaution Booklet Issued: No Precaution Comments: reviewed BLT rules, log roll in and out bed and back brace use/care Required Braces or Orthoses: Sling,Spinal Brace,Other Brace Spinal Brace: Applied in sitting position,Thoracolumbosacral orthotic,Other (comment) Other Brace: hinged elbow brace, locke to avoid full extension Restrictions Weight Bearing Restrictions: Yes LUE Weight Bearing: Non weight bearing General:   Vital Signs: Therapy Vitals Temp: 97.6 F (36.4 C) Temp Source: Oral Pulse Rate: (!) 102 Resp: 20 BP: 140/83 Patient Position (if appropriate): Lying Oxygen Therapy SpO2: 99 % O2 Device: Room Air Pain: Pain Assessment Pain Scale: 0-10 Pain Score: 2  Pain Location: Back Pain Intervention(s): Medication (See eMAR) Mobility:   Locomotion :    Trunk/Postural Assessment :    Balance:   Exercises:   Other Treatments:      Therapy/Group: Individual Therapy  Junie Panning 01/30/2021, 7:49 AM
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
## 2021-01-30 NOTE — Progress Notes (Signed)
ORTHOPAEDIC PROGRESS NOTE  SUBJECTIVE: Doing okay today, pain in right shoulder/leg as well as his back. Is going home later this morning.   OBJECTIVE: General: Sitting up in bed.  No acute distress LUE: Dressing over elbow clean, dry, intact.  Hinge brace in place. Clean Mepilex dressings applied to hand.  Able to wiggle his fingers. Is able to extend the wrist the small amount actively but this is limited by pain.  Warm well perfused digits.   IMAGING: stable  ASSESSMENT: Kenneth Mcdowell is a 55 y.o. male s/p procedures:  1. I&D left elbow x 2 on 01/18/21 and 01/20/21  2. Left distal biceps tendon repair, lateral and collateral ligament repair on 01/20/21 3. Excisional debridement subcutaneous tissue and muscle left wrist by Dr. Apolonio Schneiders 01/18/21 4. Complex wound closure 16 x 5 cm wound with rotational flap by Dr. Apolonio Schneiders 01/18/21 5. Superficial branch of the radial nerve neurolysis and exploration by Dr. Apolonio Schneiders 01/18/21 6. Left dorsum of the hand excisional debridement subcutaneous tissue tendon by Dr. Apolonio Schneiders 01/18/21 7. Complex wound closure 7 x 4 cm wound dorsal of the hand by Dr. Apolonio Schneiders 01/18/21   PLAN: Weightbearing: NWB LUE. [place] for unrestricted flexion and hinged elbow brace.  Limit extension to 30 degrees Insicional and dressing care: Change PRN. Ok to leave open to air if no drainage Orthopedic device(s): Hinged elbow brace LUE Follow - up plan: Plan for outpatient follow-up on 02/06/21 at 9:15 AM with dr. Doreatha Martin for wound check and re-evaluation of left elbow. Will need follow-up with Dr. Caralyn Guile for left hand lacerations after hospital discharge  Contact information: Katha Hamming, MD, Patrecia Pace, PA-C  After hours and holidays please check [email or web address] for group call information for [facility]   Peityn Payton A. Carmie Kanner [facility] [phone] (office) [email or web address]

## 2021-01-30 NOTE — Progress Notes (Signed)
Occupational Therapy Discharge Summary  Patient Details  Name: Kenneth Mcdowell MRN: [id number] Date of Birth: 02/21/1966  Today's Date: 01/30/2021  Patient has met 6 of 10 long term goals due to improved activity tolerance, improved balance, ability to compensate for deficits and improved awareness.  Patient to discharge at [facility] Assist - Mod A level.  Patient's care partner is independent to provide the necessary physical assistance at discharge.  Pt is discharging home earlier than expected from OT services d/t desire to leave the hospital. The pt has shown considerable progress toward OT goals during short stay by completing bed mobility and ADL transfers with CGA, sit <> stands CGA, UB dress Min, LB dress Mod/Max, and toileting with Mod A. Most ADL skills based off staff report as pt's pain has been inconsistent and limiting ADL participation at times.   Reasons goals not met: Pt [place] home earlier than expected and unable to meet goals.   Recommendation:  Patient will benefit from ongoing skilled OT services in home health setting to continue to advance functional skills in the area of BADL and Reduce care partner burden.  Equipment: No equipment provided Pt already has BSC, TTB, and wheelchair.   Reasons for discharge: Pt desire to leave hospital earlier than planned  Patient/family agrees with progress made and goals achieved: Yes    OT Discharge Precautions/Restrictions  Precautions Precautions: Fall;Back Precaution Comments: back precautions, TLSO OOB, log roll, LUE NWB Required Braces or Orthoses: Sling;Spinal Brace;Other Brace Spinal Brace: Applied in sitting position;Thoracolumbosacral orthotic;Other (comment) Other Brace: hinged elbow brace, locke to avoid full extension Restrictions Weight Bearing Restrictions: Yes LUE Weight Bearing: Non weight bearing Pain Pain Assessment Pain Scale: 0-10 Pain Score: 0-No pain Pain Location: Back Pain Intervention(s):  Medication (See eMAR) ADL ADL Equipment Provided: [place] Eating: Set up (per staff) Grooming: Setup Upper Body Bathing: Minimal assistance Where Assessed-Upper Body Bathing: Bed level Lower Body Bathing: Maximal assistance Upper Body Dressing: Minimal assistance Where Assessed-Upper Body Dressing: Bed level Lower Body Dressing: Maximal assistance Toileting: Moderate assistance (per staff) Toilet Transfer: Therapist, music Method: Ambulating Tub/Shower Transfer: Not assessed Vision Baseline Vision/History: No visual deficits Patient Visual Report: No change from baseline Perception  Perception: Within Functional Limits Praxis Praxis: Intact Cognition Overall Cognitive Status: Within Functional Limits for tasks assessed Arousal/Alertness: Awake/alert Orientation Level: Oriented X4 Memory: Appears intact Awareness: Appears intact Problem Solving: Appears intact Reasoning: Appears intact Sequencing: Appears intact Safety/Judgment: Appears intact Comments: mildly impulsive at times/poor frustration tolerance at times, inconsistent pain Sensation Sensation Light Touch: Appears Intact Coordination Gross Motor Movements are Fluid and Coordinated: No Fine Motor Movements are Fluid and Coordinated: No Coordination and Movement Description: limited LUE 2/2 injuries and NWB, inconsistent pain limiting as well Motor  Motor Motor: Within Functional Limits Motor - Skilled Clinical Observations: limited with pain and LUE NWB Mobility  Bed Mobility Supine to Sit: Contact Guard/Touching assist Sitting - Scoot to Edge of Bed: Contact Guard/Touching assist Sit to Supine: Contact Guard/Touching assist Transfers Sit to Stand: Contact Guard/Touching assist Stand to Sit: Contact Guard/Touching assist  Trunk/Postural Assessment  Cervical Assessment Cervical Assessment: Within Functional Limits Thoracic Assessment Thoracic Assessment: Exceptions to [facility] (back precautions  and TLSO) Lumbar Assessment Lumbar Assessment: Exceptions to [facility] Postural Control Postural Control: Deficits on evaluation (unable to fully test d/t precautions)  Balance Balance Balance Assessed: Yes Static Sitting Balance Static Sitting - Balance Support: Feet supported;Right upper extremity supported Static Sitting - Level of Assistance: 5: Stand by assistance  Dynamic Sitting Balance Dynamic Sitting - Balance Support: Right upper extremity supported;Feet supported Dynamic Sitting - Level of Assistance: 5: Stand by assistance Dynamic Sitting - Balance Activities: Forward lean/weight shifting;Lateral lean/weight shifting;Reaching across midline Sitting balance - Comments: sitting EOB during don/doff brace Static Standing Balance Static Standing - Balance Support: During functional activity;Right upper extremity supported Static Standing - Level of Assistance: 5: Stand by assistance Dynamic Standing Balance Dynamic Standing - Balance Support: During functional activity;Right upper extremity supported Dynamic Standing - Level of Assistance: 5: Stand by assistance Extremity/Trunk Assessment RUE Assessment RUE Assessment: Within Functional Limits LUE Assessment LUE Assessment: Exceptions to [facility] General Strength Comments: Pt has some ROM in shoulder but limited d/t pain, elbow in hinge brace and not tested, NWB, has AROM in L hand/digits as well   Viona Gilmore 01/30/2021, 9:12 AM

## 2021-01-30 NOTE — Progress Notes (Signed)
Patient discharged off of unit with all belongings. Discharge papers/instructions explained by physician assistant to patient. Patient and family have no further questions at time of discharge. No complications noted at this time.  [place]

## 2021-01-30 NOTE — Plan of Care (Signed)
  Problem: Consults Goal: RH GENERAL PATIENT EDUCATION Description: See Patient Education module for education specifics. Outcome: Completed/Met Goal: Skin Care Protocol Initiated - if Braden Score 18 or less Description: If consults are not indicated, leave blank or document N/A Outcome: Completed/Met Goal: Nutrition Consult-if indicated Outcome: Completed/Met   Problem: RH BOWEL ELIMINATION Goal: RH STG MANAGE BOWEL WITH ASSISTANCE Description: STG Manage Bowel with Mod I Assistance. Outcome: Completed/Met Goal: RH STG MANAGE BOWEL W/MEDICATION W/ASSISTANCE Description: STG Manage Bowel with Medication with Mod I Assistance. Outcome: Completed/Met   Problem: RH BLADDER ELIMINATION Goal: RH STG MANAGE BLADDER WITH ASSISTANCE Description: STG Manage Bladder With Mod I Assistance Outcome: Completed/Met Goal: RH STG MANAGE BLADDER WITH MEDICATION WITH ASSISTANCE Description: STG Manage Bladder With Medication With Mod I Assistance. Outcome: Completed/Met   Problem: RH SKIN INTEGRITY Goal: RH STG MAINTAIN SKIN INTEGRITY WITH ASSISTANCE Description: STG Maintain Skin Integrity With Mod I Assistance. Outcome: Completed/Met Goal: RH STG ABLE TO PERFORM INCISION/WOUND CARE W/ASSISTANCE Description: STG Able To Perform Incision/Wound Care With Mod I Assistance. Outcome: Completed/Met   Problem: RH SAFETY Goal: RH STG ADHERE TO SAFETY PRECAUTIONS W/ASSISTANCE/DEVICE Description: STG Adhere to Safety Precautions With Mod I Assistance/Device. Outcome: Completed/Met Goal: RH STG DECREASED RISK OF FALL WITH ASSISTANCE Description: STG Decreased Risk of Fall With Mod I Assistance. Outcome: Completed/Met   Problem: RH PAIN MANAGEMENT Goal: RH STG PAIN MANAGED AT OR BELOW PT'S PAIN GOAL Description: < 4 on a 0-10 pain scale. Outcome: Completed/Met   Problem: RH KNOWLEDGE DEFICIT GENERAL Goal: RH STG INCREASE KNOWLEDGE OF SELF CARE AFTER HOSPITALIZATION Description: Patient will be  able to demonstrate knowledge of medication management, pain management, bowel/bladder management, skin/wound care, weight bearing precautions with educational materials and handouts provided by staff. Outcome: Completed/Met

## 2021-01-30 NOTE — Progress Notes (Signed)
Occupational Therapy Session Note  Patient Details  Name: Kenneth Mcdowell MRN: [id number] Date of Birth: 1965-12-05  Today's Date: 01/30/2021 OT Individual Time: 3875-6433 OT Individual Time Calculation (min): 42 min  and Today's Date: 01/30/2021 OT Missed Time: 18 Minutes Missed Time Reason: Patient fatigue;Patient unwilling/refused to participate without medical reason   Short Term Goals: Week 1:  OT Short Term Goal 1 (Week 1): STGs = LTGs d/t ELOS  Skilled Therapeutic Interventions/Progress Updates:    Pt greeted at time of session supine in bed semireclined, very upset and vocal. Pt upset and stating he is leaving today and father is on the way to pick him up, pt did have DC paperwork on table. De-escalating situation, discussed DC planning, DME needs, help from mother, friends, etc. Pt continuing to say he will be going home, declined any ADL this am. Orthopedic PA entered at this time, stepped out to allow privacy and dressing change. Upon entering room later, provided with LHS, discussed its use for home bathing. Focus of session on ADL education, pt stating he feels prepared and does not wish to perform today. Missed 18 mins of OT.  Therapy Documentation Precautions:  Precautions Precautions: Fall,Back Precaution Booklet Issued: No Precaution Comments: reviewed BLT rules, log roll in and out bed and back brace use/care Required Braces or Orthoses: Sling,Spinal Brace,Other Brace Spinal Brace: Applied in sitting position,Thoracolumbosacral orthotic,Other (comment) Other Brace: hinged elbow brace, locke to avoid full extension Restrictions Weight Bearing Restrictions: Yes LUE Weight Bearing: Non weight bearing    Therapy/Group: Individual Therapy  Viona Gilmore 01/30/2021, 7:11 AM

## 2021-01-30 NOTE — Progress Notes (Signed)
Patient stating he is leaving today. Patient refusing all medications, nurse attempted to educate patient on importance of taking medications; patient still refused. Patient stated he will not participate in any therapies until he is discharged. Patient told dietary they better not send a single food tray up to his room while he is here. He is leaving at 10am regardless of anyone is going to say. Dr. Dagoberto Ligas and Linna Hoff, [place] aware.

## 2021-01-30 NOTE — Discharge Instructions (Signed)
Inpatient Rehab Discharge Instructions  Kenneth Mcdowell Discharge date and time: No discharge date for patient encounter.   Activities/Precautions/ Functional Status: Activity: Nonweightbearing left upper extremity with hinged elbow brace.  Back brace when out of bed Diet: regular diet Wound Care: Routine skin checks Functional status:  ___ No restrictions     ___ Walk up steps independently ___ 24/7 supervision/assistance   ___ Walk up steps with assistance ___ Intermittent supervision/assistance  ___ Bathe/dress independently ___ Walk with walker     _x__ Bathe/dress with assistance ___ Walk Independently    ___ Shower independently ___ Walk with assistance    ___ Shower with assistance ___ No alcohol     ___ Return to work/school ________  Special Instructions: No driving smoking or alcohol   My questions have been answered and I understand these instructions. I will adhere to these goals and the provided educational materials after my discharge from the hospital.  Patient/Caregiver Signature _______________________________ Date __________  Clinician Signature _______________________________________ Date __________  Please bring this form and your medication list with you to all your follow-up doctor's appointments.

## 2021-01-30 NOTE — Progress Notes (Signed)
Physical Therapy Discharge Summary  Patient Details  Name: Kenneth Mcdowell MRN: [id number] Date of Birth: 25-Nov-1965  Today's Date: 01/30/2021 Pt missed 60 mins of PT session as he was leaving unit. Pt's father and mother has been present during previous sessions. Mother especially educated on safety with pt at home, mobility, and recommend having supervision as pt is discharging to live with her.       Patient has met 6 of 10 long term goals due to improved activity tolerance, improved balance, improved postural control, decreased pain, functional use of  right lower extremity and left lower extremity and improved attention.  Patient to discharge at an ambulatory level Supervision.   Patient's care partner is independent to provide the necessary physical assistance at discharge.  Reasons goals not met: pt left rehab earlier than medically recommended and therefore did not meet all set goals.   Recommendation:  Patient will benefit from ongoing skilled PT services in home health setting to continue to advance safe functional mobility, address ongoing impairments in gait stability, tolerance to activity, balance, and minimize fall risk.  Equipment: None. Pt has equipment at home and from family per pt.   Reasons for discharge: discharge from hospital  Patient/family agrees with progress made and goals achieved: Yes  PT Discharge Precautions/Restrictions Precautions Precautions: Fall;Back Precaution Comments: back precautions, TLSO OOB, log roll, LUE NWB Required Braces or Orthoses: Sling;Spinal Brace;Other Brace Spinal Brace: Applied in sitting position;Thoracolumbosacral orthotic;Other (comment) Other Brace: hinged elbow brace, locke to avoid full extension Restrictions Weight Bearing Restrictions: Yes LUE Weight Bearing: Non weight bearing Vital Signs  Pain Pain Assessment Pain Scale: 0-10 Pain Score: 0-No pain Vision/Perception  Perception Perception: Within Functional  Limits Praxis Praxis: Intact  Cognition Overall Cognitive Status: Within Functional Limits for tasks assessed Arousal/Alertness: Awake/alert Orientation Level: Oriented X4 Focused Attention: Appears intact Sustained Attention: Appears intact Memory: Appears intact Awareness: Appears intact Problem Solving: Appears intact Executive Function: Reasoning;Sequencing Reasoning: Appears intact Sequencing: Appears intact Safety/Judgment: Appears intact Comments: mildly impulsive at times/poor frustration tolerance at times, inconsistent pain Sensation Sensation Light Touch: Appears Intact Coordination Gross Motor Movements are Fluid and Coordinated: No Fine Motor Movements are Fluid and Coordinated: No Coordination and Movement Description: BLE grossly intact but limited with pain Motor  Motor Motor: Within Functional Limits Motor - Skilled Clinical Observations: limited with pain and LUE NWB  Mobility Bed Mobility Bed Mobility: Rolling Right;Rolling Left;Supine to Sit;Sitting - Scoot to [place] of Bed;Scooting to [facility];Sit to Supine Rolling Right: Supervision/verbal cueing Rolling Left: Supervision/Verbal cueing Supine to Sit: Supervision/Verbal cueing Sitting - Scoot to Edge of Bed: Supervision/Verbal cueing Sit to Supine: Supervision/Verbal cueing Scooting to HOB: Supervision/Verbal Cueing Transfers Transfers: Sit to Stand;Stand to Sit;Stand Pivot Transfers Sit to Stand: Contact Guard/Touching assist Stand to Sit: Contact Guard/Touching assist Stand Pivot Transfers: Contact Guard/Touching assist Transfer (Assistive device): 1 person hand held assist Locomotion  Gait Ambulation: Yes Gait Assistance: Contact Guard/Touching assist Gait Distance (Feet): 150 Feet Assistive device: Small based quad cane Gait Gait: Yes Gait Pattern: Impaired Gait Pattern: Lateral trunk lean to left;Lateral trunk lean to right Stairs / Additional Locomotion Stairs: Yes Stairs Assistance:  Supervision/Verbal cueing Stair Management Technique: One rail Right Number of Stairs: 4 Height of Stairs: 6 Ramp: Contact Guard/touching assist Curb: Nurse, mental health Mobility: Yes Wheelchair Assistance: Chartered loss adjuster: Both lower extermities;Right upper extremity Wheelchair Parts Management: Supervision/cueing Distance: 150  Trunk/Postural Assessment  Cervical Assessment Cervical Assessment: Within Functional Limits Thoracic Assessment  Thoracic Assessment: Exceptions to [facility] (rounded shoulders) Lumbar Assessment Lumbar Assessment: Exceptions to WFL (rounded shoulders) Postural Control Postural Control: Deficits on evaluation  Balance Balance Balance Assessed: Yes Static Sitting Balance Static Sitting - Balance Support: Feet supported;Right upper extremity supported Static Sitting - Level of Assistance: 5: Stand by assistance Dynamic Sitting Balance Dynamic Sitting - Balance Support: Right upper extremity supported;Feet supported Dynamic Sitting - Level of Assistance: 5: Stand by assistance Dynamic Sitting - Balance Activities: Forward lean/weight shifting;Lateral lean/weight shifting;Reaching across midline Sitting balance - Comments: sitting EOB during don/doff brace Static Standing Balance Static Standing - Balance Support: During functional activity;Right upper extremity supported Static Standing - Level of Assistance: 5: Stand by assistance Dynamic Standing Balance Dynamic Standing - Balance Support: During functional activity;Right upper extremity supported Dynamic Standing - Level of Assistance: 5: Stand by assistance Dynamic Standing - Balance Activities: Forward lean/weight shifting;Lateral lean/weight shifting;Reaching for objects Extremity Assessment  RUE Assessment RUE Assessment: Within Functional Limits LUE Assessment LUE Assessment: Exceptions to [facility] General Strength Comments: Pt  has some ROM in shoulder but limited d/t pain, elbow in hinge brace and not tested, NWB, has AROM in L hand/digits as well RLE Assessment RLE Assessment: Exceptions to [facility] General Strength Comments: grossly 4/5 LLE Assessment LLE Assessment: Exceptions to [facility] General Strength Comments: grossly 4/5    Junie Panning 01/30/2021, 10:55 AM
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
## 2021-02-02 MED FILL — TAMSULOSIN HCL 0.4 MG CAP: 0.4 | 30 days supply | Qty: 30 | Fill #0

## 2021-02-02 MED FILL — METAXALONE 800 MG TABS: 800 | 10 days supply | Qty: 15 | Fill #0

## 2021-02-02 MED FILL — BETHANECHOL 25 MG TABLET: 25 | 7 days supply | Qty: 21 | Fill #0

## 2021-02-02 MED FILL — AMLODIPINE BESYLATE 5 MG TA: 5 | 30 days supply | Qty: 30 | Fill #0

## 2021-02-02 MED FILL — traZODone HCL 50 MG TABS: 50 | 5 days supply | Qty: 10 | Fill #0

## 2021-02-02 MED FILL — tiZANidine HCL 4 MG TABS: 4 | 30 days supply | Qty: 90 | Fill #0

## 2021-02-02 NOTE — Progress Notes (Signed)
Patient ID: Kenneth Mcdowell, male   DOB: 09-17-1966, 55 y.o.   MRN: [id number]   SW received phone call from patient's mother Kenneth Mcdowell), requesting information to be sent to pt's lawyer. SW provided pt mother with medical record request information. Mother requesting call from financial counselor assisting with Medicaid.   [name], [place]

## 2021-02-06 DIAGNOSIS — S51002A Unspecified open wound of left elbow, initial encounter: Secondary | ICD-10-CM

## 2021-02-07 ENCOUNTER — Telehealth: Payer: Self-pay

## 2021-02-07 ENCOUNTER — Ambulatory Visit: Payer: Self-pay | Attending: Critical Care Medicine | Admitting: Critical Care Medicine

## 2021-02-07 ENCOUNTER — Other Ambulatory Visit: Payer: Self-pay

## 2021-02-07 ENCOUNTER — Encounter: Payer: Self-pay | Admitting: Critical Care Medicine

## 2021-02-07 ENCOUNTER — Other Ambulatory Visit (HOSPITAL_COMMUNITY): Payer: Self-pay | Admitting: Critical Care Medicine

## 2021-02-07 VITALS — BP 124/81 | HR 121 | Resp 22 | Ht 75.0 in | Wt 165.6 lb

## 2021-02-07 DIAGNOSIS — R339 Retention of urine, unspecified: Secondary | ICD-10-CM

## 2021-02-07 DIAGNOSIS — Z87828 Personal history of other (healed) physical injury and trauma: Secondary | ICD-10-CM

## 2021-02-07 DIAGNOSIS — J4551 Severe persistent asthma with (acute) exacerbation: Secondary | ICD-10-CM

## 2021-02-07 DIAGNOSIS — T07XXXA Unspecified multiple injuries, initial encounter: Secondary | ICD-10-CM

## 2021-02-07 DIAGNOSIS — S51002A Unspecified open wound of left elbow, initial encounter: Secondary | ICD-10-CM

## 2021-02-07 DIAGNOSIS — K5900 Constipation, unspecified: Secondary | ICD-10-CM

## 2021-02-07 DIAGNOSIS — S27321D Contusion of lung, unilateral, subsequent encounter: Secondary | ICD-10-CM

## 2021-02-07 DIAGNOSIS — S6992XS Unspecified injury of left wrist, hand and finger(s), sequela: Secondary | ICD-10-CM

## 2021-02-07 DIAGNOSIS — G4701 Insomnia due to medical condition: Secondary | ICD-10-CM

## 2021-02-07 DIAGNOSIS — S32001S Stable burst fracture of unspecified lumbar vertebra, sequela: Secondary | ICD-10-CM

## 2021-02-07 DIAGNOSIS — S53105A Unspecified dislocation of left ulnohumeral joint, initial encounter: Secondary | ICD-10-CM

## 2021-02-07 DIAGNOSIS — F319 Bipolar disorder, unspecified: Secondary | ICD-10-CM

## 2021-02-07 DIAGNOSIS — S2242XD Multiple fractures of ribs, left side, subsequent encounter for fracture with routine healing: Secondary | ICD-10-CM

## 2021-02-07 DIAGNOSIS — J449 Chronic obstructive pulmonary disease, unspecified: Secondary | ICD-10-CM

## 2021-02-07 DIAGNOSIS — S32049D Unspecified fracture of fourth lumbar vertebra, subsequent encounter for fracture with routine healing: Secondary | ICD-10-CM | POA: Insufficient documentation

## 2021-02-07 DIAGNOSIS — F1414 Cocaine abuse with cocaine-induced mood disorder: Secondary | ICD-10-CM

## 2021-02-07 DIAGNOSIS — I10 Essential (primary) hypertension: Secondary | ICD-10-CM

## 2021-02-07 HISTORY — DX: Unspecified fracture of fourth lumbar vertebra, subsequent encounter for fracture with routine healing: S32.049D

## 2021-02-07 MED ORDER — TAMSULOSIN HCL 0.4 MG PO CAPS: 0.4000 mg | ORAL_CAPSULE | Freq: Every day | ORAL | 3 refills | Status: DC

## 2021-02-07 MED ORDER — BUDESONIDE-FORMOTEROL FUMARATE 160-4.5 MCG/ACT IN AERO: 2.0000 | INHALATION_SPRAY | Freq: Two times a day (BID) | RESPIRATORY_TRACT | 12 refills | Status: DC

## 2021-02-07 MED ORDER — TRAZODONE HCL 150 MG PO TABS: 150.0000 mg | ORAL_TABLET | Freq: Every evening | ORAL | 0 refills | Status: DC | PRN

## 2021-02-07 MED ORDER — PREDNISONE 10 MG PO TABS: ORAL_TABLET | ORAL | 0 refills | Status: DC

## 2021-02-07 MED ORDER — SENNOSIDES-DOCUSATE SODIUM 8.6-50 MG PO TABS: 3.0000 | ORAL_TABLET | Freq: Every day | ORAL | 1 refills | Status: DC

## 2021-02-07 MED ORDER — ALBUTEROL SULFATE HFA 108 (90 BASE) MCG/ACT IN AERS: 2.0000 | INHALATION_SPRAY | Freq: Four times a day (QID) | RESPIRATORY_TRACT | 3 refills | Status: DC | PRN

## 2021-02-07 MED ORDER — METAXALONE 400 MG PO TABS: 400.0000 mg | ORAL_TABLET | Freq: Three times a day (TID) | ORAL | 1 refills | Status: DC | PRN

## 2021-02-07 MED ORDER — POLYETHYLENE GLYCOL 3350 17 GM/SCOOP PO POWD: 17.0000 g | Freq: Two times a day (BID) | ORAL | 1 refills | Status: DC | PRN

## 2021-02-07 MED ORDER — AMLODIPINE BESYLATE 5 MG PO TABS: 5.0000 mg | ORAL_TABLET | Freq: Every day | ORAL | 1 refills | Status: DC

## 2021-02-07 MED ORDER — BETHANECHOL CHLORIDE 25 MG PO TABS: 25.0000 mg | ORAL_TABLET | Freq: Three times a day (TID) | ORAL | 0 refills | Status: DC

## 2021-02-07 MED ORDER — TIZANIDINE HCL 4 MG PO TABS: 4.0000 mg | ORAL_TABLET | Freq: Three times a day (TID) | ORAL | 0 refills | Status: DC

## 2021-02-07 MED FILL — traZODone HCL 150 MG TABS: 150 | 30 days supply | Qty: 30 | Fill #0

## 2021-02-07 MED FILL — predniSONE 10 MG TABS: 10 | 5 days supply | Qty: 20 | Fill #0

## 2021-02-07 MED FILL — $Symbicort 160-4.5mcg/act: 160-4.5 | 90 days supply | Qty: 3 | Fill #0

## 2021-02-07 MED FILL — METAXALONE 400 MG TABS: 400 | 10 days supply | Qty: 30 | Fill #0

## 2021-02-07 MED FILL — BETHANECHOL 25 MG TABLET: 25 | 10 days supply | Qty: 30 | Fill #0

## 2021-02-07 MED FILL — PROAIR HFA 90 MCG INHALER: 108 (90 BAS | 25 days supply | Qty: 9 | Fill #0

## 2021-02-07 NOTE — Assessment & Plan Note (Signed)
Follow-up per mental health

## 2021-02-07 NOTE — Assessment & Plan Note (Signed)
Bowel program given

## 2021-02-07 NOTE — Assessment & Plan Note (Signed)
Follow-up x-rays show contusion to be resolved

## 2021-02-07 NOTE — Assessment & Plan Note (Signed)
Improve Urecholine and Flomax continue same

## 2021-02-07 NOTE — Assessment & Plan Note (Signed)
Patient states he is not currently using cocaine
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
## 2021-02-07 NOTE — Patient Instructions (Addendum)
Go to lab to check your metabolic panel and blood count today  Go to [facility] outpatient pharmacy [address] to pick up your medications using a nurse fund  Start prednisone pulse  Return to see Dr. Joya Gaskins in 1 month  Obtain MiraLAX and Senokot-S to help with bowel program  Begin to have Darliss Cheney our case manager contact you to provide resources on how to obtain disability through [facility]
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
## 2021-02-07 NOTE — Assessment & Plan Note (Signed)
History of pulmonary contusion now reactive airway disease with exacerbation patient not smoking at this time  We will give pulse dose of prednisone  Refill all inhalers

## 2021-02-07 NOTE — Assessment & Plan Note (Signed)
Follow-up per orthopedics 
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
## 2021-02-07 NOTE — Telephone Encounter (Signed)
Met with the patient and his mother when he was in the clinic today for his appointment.  Referred by Dr Joya Gaskins for disability medicaid.  Explained to them that a referral can be made to [facility] to inquire about eligibility for disability/ medicaid.  The patient had been working but is not sure about his future ability to work.   He signed authorization for [facility] referral. If he does not qualify for disability, this CM will contact the patient about medicaid application. His mother said that he was denied medicaid when he lived in [place] years ago.    Message sent to [facility] regarding the referral and inquiring if patient would qualify for assistance,

## 2021-02-07 NOTE — Progress Notes (Signed)
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 (three) times daily. -     traZODone (DESYREL) 150 MG tablet; Take 1 tablet (150 mg total) by mouth at bedtime as needed for sleep (if doesn't fall asleep by 11pm- don't give after 1am, please). -     albuterol (VENTOLIN HFA) 108 (90 Base) MCG/ACT inhaler; Inhale 2 puffs into the lungs every 6  (six) hours as needed for wheezing or shortness of breath. -     budesonide-formoterol (SYMBICORT) 160-4.5 MCG/ACT inhaler; Inhale 2 puffs into the lungs 2 (two) times daily. -     polyethylene glycol powder (GLYCOLAX/MIRALAX) 17 GM/SCOOP powder; Take 17 g by mouth 2 (two) times daily as needed. -     senna-docusate (SENOKOT-S) 8.6-50 MG tablet; Take 3 tablets by mouth at bedtime. -     predniSONE (DELTASONE) 10 MG tablet; Take 4 tablets daily for 5 days then stop

## 2021-02-07 NOTE — Telephone Encounter (Signed)
During today's office with the provider, [facility] RN Care Coordinator and Transitional Care Coordinator spoke with patient and their mother on next steps for applying for disability through medicaid and a handicap placard. [facility] Care Team assisted the patient with completing paperwork for a community partner to assist with disability through [place].

## 2021-02-07 NOTE — Assessment & Plan Note (Signed)
Slowly healing on their own

## 2021-02-07 NOTE — Assessment & Plan Note (Signed)
Initial blood pressure noted to be elevated but now is well controlled on amlodipine 5 mg daily no changes were made

## 2021-02-07 NOTE — Assessment & Plan Note (Signed)
Increase trazodone 150 mg nightly

## 2021-02-08 ENCOUNTER — Telehealth: Payer: Self-pay

## 2021-02-08 LAB — CBC WITH DIFFERENTIAL/PLATELET
Basophils Absolute: 0 10*3/uL (ref 0.0–0.2)
Basos: 1 %
EOS (ABSOLUTE): 0.2 10*3/uL (ref 0.0–0.4)
Eos: 3 %
Hematocrit: 36.4 % — ABNORMAL LOW (ref 37.5–51.0)
Hemoglobin: 12.2 g/dL — ABNORMAL LOW (ref 13.0–17.7)
Immature Grans (Abs): 0 10*3/uL (ref 0.0–0.1)
Immature Granulocytes: 1 %
Lymphocytes Absolute: 1.4 10*3/uL (ref 0.7–3.1)
Lymphs: 21 %
MCH: 30 pg (ref 26.6–33.0)
MCHC: 33.5 g/dL (ref 31.5–35.7)
MCV: 90 fL (ref 79–97)
Monocytes Absolute: 0.7 10*3/uL (ref 0.1–0.9)
Monocytes: 10 %
Neutrophils Absolute: 4.3 10*3/uL (ref 1.4–7.0)
Neutrophils: 64 %
Platelets: 644 10*3/uL — ABNORMAL HIGH (ref 150–450)
RBC: 4.06 x10E6/uL — ABNORMAL LOW (ref 4.14–5.80)
RDW: 13.2 % (ref 11.6–15.4)
WBC: 6.6 10*3/uL (ref 3.4–10.8)

## 2021-02-08 LAB — COMPREHENSIVE METABOLIC PANEL
ALT: 15 IU/L (ref 0–44)
AST: 14 IU/L (ref 0–40)
Albumin/Globulin Ratio: 1.8 (ref 1.2–2.2)
Albumin: 4.5 g/dL (ref 3.8–4.9)
Alkaline Phosphatase: 193 IU/L — ABNORMAL HIGH (ref 44–121)
BUN/Creatinine Ratio: 13 (ref 9–20)
BUN: 11 mg/dL (ref 6–24)
Bilirubin Total: 0.2 mg/dL (ref 0.0–1.2)
CO2: 16 mmol/L — ABNORMAL LOW (ref 20–29)
Calcium: 9.2 mg/dL (ref 8.7–10.2)
Chloride: 101 mmol/L (ref 96–106)
Creatinine, Ser: 0.85 mg/dL (ref 0.76–1.27)
Globulin, Total: 2.5 g/dL (ref 1.5–4.5)
Glucose: 105 mg/dL — ABNORMAL HIGH (ref 65–99)
Potassium: 5.1 mmol/L (ref 3.5–5.2)
Sodium: 136 mmol/L (ref 134–144)
Total Protein: 7 g/dL (ref 6.0–8.5)
eGFR: 104 mL/min/{1.73_m2} (ref >59)

## 2021-02-08 NOTE — Telephone Encounter (Signed)
-----   Message from [name], MD sent at 02/08/2021  8:27 AM EDT ----- Let the patient know labs ok

## 2021-02-08 NOTE — Telephone Encounter (Signed)
Attempted to call pt no answer no VM setup to leave message. Will try to call pt again later.

## 2021-02-08 NOTE — Telephone Encounter (Signed)
Attempted to call pt for second time no answer.

## 2021-02-09 NOTE — Telephone Encounter (Signed)
-----   Message from [name], MD sent at 02/08/2021  8:27 AM EDT ----- Let the patient know labs ok

## 2021-02-09 NOTE — Telephone Encounter (Signed)
Pt informed of lab results verbalized understanding no other concerns noted.
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
## 2021-02-13 ENCOUNTER — Encounter: Payer: Self-pay | Admitting: Critical Care Medicine

## 2021-03-06 NOTE — Progress Notes (Signed)
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                                                        +---------+---------------+---------+-----------+----------+--------------+   Summary: BILATERAL: - No evidence of deep vein thrombosis seen in the lower extremities, bilaterally. - No evidence of superficial venous thrombosis in the lower extremities, bilaterally. -No evidence of popliteal cyst,  bilaterally.   *See table(s) above for measurements and observations. Electronically signed by Servando Snare MD on 01/26/2021 at 5:53:39 PM.    Final          Assessment & Plan:  I personally reviewed all images and lab data in the [facility] system as well as any outside material available during this office visit and agree with the  radiology impressions.   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  Diagnoses and all orders for this visit:  Primary hypertension  COPD with asthma ([place])  Dislocation of elbow, posterior, left, open, initial encounter  Adjustment disorder with mixed anxiety and depressed mood  Other orders -     amLODipine (NORVASC) 5 MG tablet; Take 1 tablet (5 mg total) by mouth daily. -     budesonide-formoterol (SYMBICORT) 160-4.5 MCG/ACT inhaler; Inhale 2 puffs into the lungs 2 (two) times daily. -     predniSONE (DELTASONE) 10 MG tablet; Take 4 tablets daily for 5 days then stop -     sertraline (ZOLOFT) 50 MG tablet; Take 1 tablet (50 mg total) by mouth daily.  I spent 20 minutes with this patient reviewing his records formulating a plan with moderate decision making

## 2021-03-13 ENCOUNTER — Other Ambulatory Visit: Payer: Self-pay

## 2021-03-13 ENCOUNTER — Encounter: Payer: Self-pay | Admitting: Critical Care Medicine

## 2021-03-13 ENCOUNTER — Ambulatory Visit
Payer: No Typology Code available for payment source | Attending: Critical Care Medicine | Admitting: Critical Care Medicine

## 2021-03-13 VITALS — BP 118/83 | HR 74 | Resp 20 | Ht 72.0 in | Wt 158.6 lb

## 2021-03-13 DIAGNOSIS — S53125A Posterior dislocation of left ulnohumeral joint, initial encounter: Secondary | ICD-10-CM

## 2021-03-13 DIAGNOSIS — I10 Essential (primary) hypertension: Secondary | ICD-10-CM

## 2021-03-13 DIAGNOSIS — S51002A Unspecified open wound of left elbow, initial encounter: Secondary | ICD-10-CM

## 2021-03-13 DIAGNOSIS — J449 Chronic obstructive pulmonary disease, unspecified: Secondary | ICD-10-CM

## 2021-03-13 DIAGNOSIS — F4323 Adjustment disorder with mixed anxiety and depressed mood: Secondary | ICD-10-CM

## 2021-03-13 MED ORDER — PREDNISONE 10 MG PO TABS
ORAL_TABLET | ORAL | 0 refills | Status: DC
  Filled 2021-03-13: qty 20, 5d supply, fill #0

## 2021-03-13 MED ORDER — SERTRALINE HCL 50 MG PO TABS
50.0000 mg | ORAL_TABLET | Freq: Every day | ORAL | 3 refills | Status: DC
Start: 2021-03-13 — End: 2021-05-16
  Filled 2021-03-13: qty 30, 30d supply, fill #0
  Filled 2021-04-12: qty 30, 30d supply, fill #1
  Filled 2021-05-10: qty 30, 30d supply, fill #2

## 2021-03-13 MED ORDER — BUDESONIDE-FORMOTEROL FUMARATE 160-4.5 MCG/ACT IN AERO
2.0000 | INHALATION_SPRAY | Freq: Two times a day (BID) | RESPIRATORY_TRACT | 12 refills | Status: DC
  Filled 2021-03-13: qty 10.2, 30d supply, fill #0
  Filled 2021-04-12: qty 10.2, 30d supply, fill #1
  Filled 2021-05-02: qty 10.2, 30d supply, fill #2
  Filled 2021-05-10: qty 20.4, 60d supply, fill #2
  Filled 2021-07-13: qty 30.6, 90d supply, fill #3

## 2021-03-13 MED ORDER — AMLODIPINE BESYLATE 5 MG PO TABS
5.0000 mg | ORAL_TABLET | Freq: Every day | ORAL | 1 refills | Status: DC
  Filled 2021-03-13: qty 30, 30d supply, fill #0
  Filled 2021-04-12: qty 30, 30d supply, fill #1
  Filled 2021-05-10: qty 30, 30d supply, fill #2

## 2021-03-13 NOTE — Assessment & Plan Note (Signed)
COPD asthma overlap continue Symbicort repulsed prednisone

## 2021-03-13 NOTE — Assessment & Plan Note (Signed)
As per orthopedic trauma

## 2021-03-13 NOTE — Patient Instructions (Signed)
Refill on your Symbicort sent to the pharmacy please pick this up today Take another round of prednisone sent to our pharmacy here  A fecal occult kit was sent to the lab please pick this up on the way out to screen for colon cancer  Please obtain a COVID booster at your [facility]  Return to see Dr.  2 months 

## 2021-03-13 NOTE — Assessment & Plan Note (Signed)
Hypertension at goal continue amlodipine

## 2021-03-13 NOTE — Assessment & Plan Note (Signed)
Begin sertraline 50 mg daily

## 2021-03-14 ENCOUNTER — Other Ambulatory Visit: Payer: Self-pay | Admitting: Critical Care Medicine

## 2021-03-14 DIAGNOSIS — Z1211 Encounter for screening for malignant neoplasm of colon: Secondary | ICD-10-CM

## 2021-03-18 LAB — FECAL OCCULT BLOOD, IMMUNOCHEMICAL: Fecal Occult Bld: POSITIVE — AB

## 2021-03-20 ENCOUNTER — Telehealth: Payer: Self-pay

## 2021-03-20 NOTE — Telephone Encounter (Signed)
-----   Message from Patrick E Wright, MD sent at 03/20/2021  1:11 PM EDT ----- Let pt know stool study does show trace blood in the stool may be from the hemorrhoids.  Will watch for now , If he gets medicaid we can refer for colonoscopy later but not now 

## 2021-03-20 NOTE — Telephone Encounter (Signed)
Attempted to call pt regarding lab results no answer no VM setup to leave message. Will try contact pt again later.

## 2021-03-20 NOTE — Telephone Encounter (Signed)
Pt informed of lab results and verbalized understanding no other concerns voiced.

## 2021-03-20 NOTE — Telephone Encounter (Signed)
-----   Message from [name], MD sent at 03/20/2021  1:11 PM EDT ----- Let pt know stool study does show trace blood in the stool may be from the hemorrhoids.  Will watch for now , If he gets medicaid we can refer for colonoscopy later but not now

## 2021-04-12 ENCOUNTER — Other Ambulatory Visit: Payer: Self-pay

## 2021-05-02 ENCOUNTER — Other Ambulatory Visit: Payer: Self-pay

## 2021-05-10 ENCOUNTER — Other Ambulatory Visit: Payer: Self-pay
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
## 2021-05-16 ENCOUNTER — Ambulatory Visit: Payer: Self-pay | Attending: Critical Care Medicine | Admitting: Critical Care Medicine

## 2021-05-16 ENCOUNTER — Other Ambulatory Visit: Payer: Self-pay

## 2021-05-16 ENCOUNTER — Encounter: Payer: Self-pay | Admitting: Critical Care Medicine

## 2021-05-16 VITALS — BP 120/80 | HR 84 | Resp 16 | Wt 146.2 lb

## 2021-05-16 DIAGNOSIS — J449 Chronic obstructive pulmonary disease, unspecified: Secondary | ICD-10-CM

## 2021-05-16 DIAGNOSIS — F4323 Adjustment disorder with mixed anxiety and depressed mood: Secondary | ICD-10-CM

## 2021-05-16 DIAGNOSIS — N529 Male erectile dysfunction, unspecified: Secondary | ICD-10-CM

## 2021-05-16 DIAGNOSIS — I10 Essential (primary) hypertension: Secondary | ICD-10-CM

## 2021-05-16 DIAGNOSIS — J4551 Severe persistent asthma with (acute) exacerbation: Secondary | ICD-10-CM

## 2021-05-16 MED ORDER — AMLODIPINE BESYLATE 5 MG PO TABS
5.0000 mg | ORAL_TABLET | Freq: Every day | ORAL | 1 refills | Status: DC
Start: 2021-05-16 — End: 2021-09-18
  Filled 2021-05-16 – 2021-06-12 (×2): qty 60, 60d supply, fill #0

## 2021-05-16 MED ORDER — PREDNISONE 10 MG PO TABS
ORAL_TABLET | ORAL | 0 refills | Status: DC
  Filled 2021-05-16: qty 20, 5d supply, fill #0

## 2021-05-16 MED ORDER — ALBUTEROL SULFATE HFA 108 (90 BASE) MCG/ACT IN AERS
2.0000 | INHALATION_SPRAY | Freq: Four times a day (QID) | RESPIRATORY_TRACT | 1 refills | Status: DC | PRN
  Filled 2021-05-16: qty 6.7, 25d supply, fill #0
  Filled 2021-06-12: qty 6.7, 25d supply, fill #1

## 2021-05-16 MED ORDER — AZITHROMYCIN 250 MG PO TABS
ORAL_TABLET | ORAL | 0 refills | Status: DC
  Filled 2021-05-16: qty 6, 5d supply, fill #0

## 2021-05-16 MED ORDER — SERTRALINE HCL 100 MG PO TABS
100.0000 mg | ORAL_TABLET | Freq: Every day | ORAL | 2 refills | Status: DC
  Filled 2021-05-16: qty 30, 30d supply, fill #0
  Filled 2021-06-12: qty 30, 30d supply, fill #1
  Filled 2021-07-13: qty 30, 30d supply, fill #2

## 2021-05-16 NOTE — Patient Instructions (Signed)
Take prednisone 4 tablets daily for 5 days and stop Take azithromycin antibiotic to the first day then 1 a day till the packaging ends  Stay on your Symbicort and albuterol Stay on amlodipine Increase sertraline to 100 mg daily you can take 2 of the 50 mg tablets daily and the refill will be a 100 mg tablets take 1 daily for depression anxiety  We discussed that it is not a good plan to have erectile dysfunction medications at this time due to your lung disease  Return to see Dr. Joya Gaskins 4 months

## 2021-05-16 NOTE — Assessment & Plan Note (Signed)
Well-controlled on amlodipine continue same

## 2021-05-16 NOTE — Assessment & Plan Note (Signed)
Significant depression increase sertraline 100 mg today

## 2021-05-16 NOTE — Assessment & Plan Note (Signed)
COPD asthma overlap syndrome with acute exacerbation  Refills on Symbicort and albuterol sent to the pharmacy  Begin prednisone 40 mg a day for 5 days and azithromycin for 5days

## 2021-05-16 NOTE — Assessment & Plan Note (Signed)
As per above assessment

## 2021-05-16 NOTE — Assessment & Plan Note (Signed)
I told the patient I do not advise erectile dysfunction medications at this time due to the severity of his chronic lung disease he understood

## 2021-06-12 ENCOUNTER — Other Ambulatory Visit: Payer: Self-pay

## 2021-07-13 ENCOUNTER — Other Ambulatory Visit: Payer: Self-pay

## 2021-07-13 ENCOUNTER — Other Ambulatory Visit: Payer: Self-pay | Admitting: Critical Care Medicine

## 2021-07-13 MED ORDER — ALBUTEROL SULFATE HFA 108 (90 BASE) MCG/ACT IN AERS
2.0000 | INHALATION_SPRAY | Freq: Four times a day (QID) | RESPIRATORY_TRACT | 1 refills | Status: DC | PRN
  Filled 2021-07-13: qty 6.7, 25d supply, fill #0

## 2021-07-20 ENCOUNTER — Other Ambulatory Visit: Payer: Self-pay
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
## 2021-09-18 ENCOUNTER — Other Ambulatory Visit: Payer: Self-pay

## 2021-09-18 ENCOUNTER — Ambulatory Visit
Payer: No Typology Code available for payment source | Attending: Critical Care Medicine | Admitting: Critical Care Medicine

## 2021-09-18 ENCOUNTER — Encounter: Payer: Self-pay | Admitting: Critical Care Medicine

## 2021-09-18 VITALS — BP 110/75 | HR 84 | Resp 16 | Wt 149.7 lb

## 2021-09-18 DIAGNOSIS — F319 Bipolar disorder, unspecified: Secondary | ICD-10-CM

## 2021-09-18 DIAGNOSIS — J449 Chronic obstructive pulmonary disease, unspecified: Secondary | ICD-10-CM

## 2021-09-18 DIAGNOSIS — I10 Essential (primary) hypertension: Secondary | ICD-10-CM

## 2021-09-18 DIAGNOSIS — Z720 Tobacco use: Secondary | ICD-10-CM

## 2021-09-18 MED ORDER — NICOTINE POLACRILEX 4 MG MT LOZG
LOZENGE | OROMUCOSAL | 4 refills | Status: DC
  Filled 2021-09-18: qty 72, 24d supply, fill #0

## 2021-09-18 MED ORDER — BUDESONIDE-FORMOTEROL FUMARATE 160-4.5 MCG/ACT IN AERO
2.0000 | INHALATION_SPRAY | Freq: Two times a day (BID) | RESPIRATORY_TRACT | 12 refills | Status: DC
  Filled 2021-09-18 – 2021-09-21 (×2): qty 10.2, 30d supply, fill #0
  Filled 2021-10-18: qty 10.2, 30d supply, fill #1
  Filled 2021-11-20: qty 10.2, 30d supply, fill #0
  Filled 2021-12-18: qty 10.2, 30d supply, fill #1

## 2021-09-18 MED ORDER — ALBUTEROL SULFATE HFA 108 (90 BASE) MCG/ACT IN AERS
2.0000 | INHALATION_SPRAY | Freq: Four times a day (QID) | RESPIRATORY_TRACT | 1 refills | Status: DC | PRN
  Filled 2021-09-18: qty 6.7, 25d supply, fill #0
  Filled 2021-10-18: qty 6.7, 25d supply, fill #1

## 2021-09-18 NOTE — Assessment & Plan Note (Signed)
Blood pressure stable off medications monitor for now

## 2021-09-18 NOTE — Assessment & Plan Note (Signed)
Stable COPD plan to refill Symbicort and albuterol

## 2021-09-18 NOTE — Assessment & Plan Note (Signed)
Stable off sertraline observe

## 2021-09-18 NOTE — Patient Instructions (Signed)
Trial of nicotine lozenge to quit using tobacco chews  Inhalers were refilled  You declined the flu vaccine and pneumonia vaccine you were considering a shingles vaccine let us know if you make a decision on this  No other medication changes you have stopped your blood pressure medicine your blood pressure is perfect today no refills needed  Monitor your mental health for now no refills on sertraline given  Return to Dr. Joya Gaskins 4 months

## 2021-09-18 NOTE — Progress Notes (Signed)
Opened in error

## 2021-09-18 NOTE — Assessment & Plan Note (Signed)
The patient is no longer smoking but is chewing tobacco and has carious anterior teeth plan is to trial nicotine lozenge to get off the chews

## 2021-09-21 ENCOUNTER — Other Ambulatory Visit: Payer: Self-pay

## 2021-10-18 ENCOUNTER — Other Ambulatory Visit: Payer: Self-pay

## 2021-11-13 ENCOUNTER — Other Ambulatory Visit: Payer: Self-pay

## 2021-11-18 ENCOUNTER — Other Ambulatory Visit: Payer: Self-pay | Admitting: Critical Care Medicine

## 2021-11-18 ENCOUNTER — Other Ambulatory Visit: Payer: Self-pay

## 2021-11-18 MED ORDER — ALBUTEROL SULFATE HFA 108 (90 BASE) MCG/ACT IN AERS
2.0000 | INHALATION_SPRAY | Freq: Four times a day (QID) | RESPIRATORY_TRACT | 1 refills | Status: DC | PRN
  Filled 2021-11-18: qty 8.5, 25d supply, fill #0
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
## 2021-11-20 ENCOUNTER — Other Ambulatory Visit: Payer: Self-pay

## 2021-12-18 ENCOUNTER — Other Ambulatory Visit: Payer: Self-pay

## 2021-12-18 ENCOUNTER — Other Ambulatory Visit: Payer: Self-pay | Admitting: Critical Care Medicine

## 2021-12-19 ENCOUNTER — Other Ambulatory Visit: Payer: Self-pay

## 2021-12-19 MED ORDER — ALBUTEROL SULFATE HFA 108 (90 BASE) MCG/ACT IN AERS
2.0000 | INHALATION_SPRAY | Freq: Four times a day (QID) | RESPIRATORY_TRACT | 1 refills | Status: DC | PRN
  Filled 2021-12-19: qty 8.5, 25d supply, fill #0
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
## 2021-12-26 ENCOUNTER — Other Ambulatory Visit: Payer: Self-pay
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
## 2022-01-16 ENCOUNTER — Ambulatory Visit
Admission: RE | Admit: 2022-01-16 | Discharge: 2022-01-16 | Disposition: A | Discharge status: Home / Self Care | Payer: Self-pay | Source: Ambulatory Visit | Attending: Critical Care Medicine | Admitting: Critical Care Medicine

## 2022-01-16 ENCOUNTER — Ambulatory Visit: Payer: Self-pay | Attending: Critical Care Medicine | Admitting: Critical Care Medicine

## 2022-01-16 ENCOUNTER — Other Ambulatory Visit: Payer: Self-pay

## 2022-01-16 ENCOUNTER — Encounter: Payer: Self-pay | Admitting: Critical Care Medicine

## 2022-01-16 VITALS — BP 114/80 | HR 73 | Wt 152.2 lb

## 2022-01-16 DIAGNOSIS — J4551 Severe persistent asthma with (acute) exacerbation: Secondary | ICD-10-CM

## 2022-01-16 DIAGNOSIS — G8929 Other chronic pain: Secondary | ICD-10-CM | POA: Insufficient documentation

## 2022-01-16 DIAGNOSIS — M25511 Pain in right shoulder: Secondary | ICD-10-CM

## 2022-01-16 DIAGNOSIS — F319 Bipolar disorder, unspecified: Secondary | ICD-10-CM

## 2022-01-16 DIAGNOSIS — Z720 Tobacco use: Secondary | ICD-10-CM

## 2022-01-16 DIAGNOSIS — J449 Chronic obstructive pulmonary disease, unspecified: Secondary | ICD-10-CM

## 2022-01-16 DIAGNOSIS — I10 Essential (primary) hypertension: Secondary | ICD-10-CM

## 2022-01-16 IMAGING — CR DG SHOULDER 2+V*R*
3 series · 3 of 3 positions shown · non-contrast
Comparison: None.

CLINICAL DATA: Chronic right shoulder pain.

EXAM:
RIGHT SHOULDER - 2+ VIEW

[w shoulder ap internal righ]
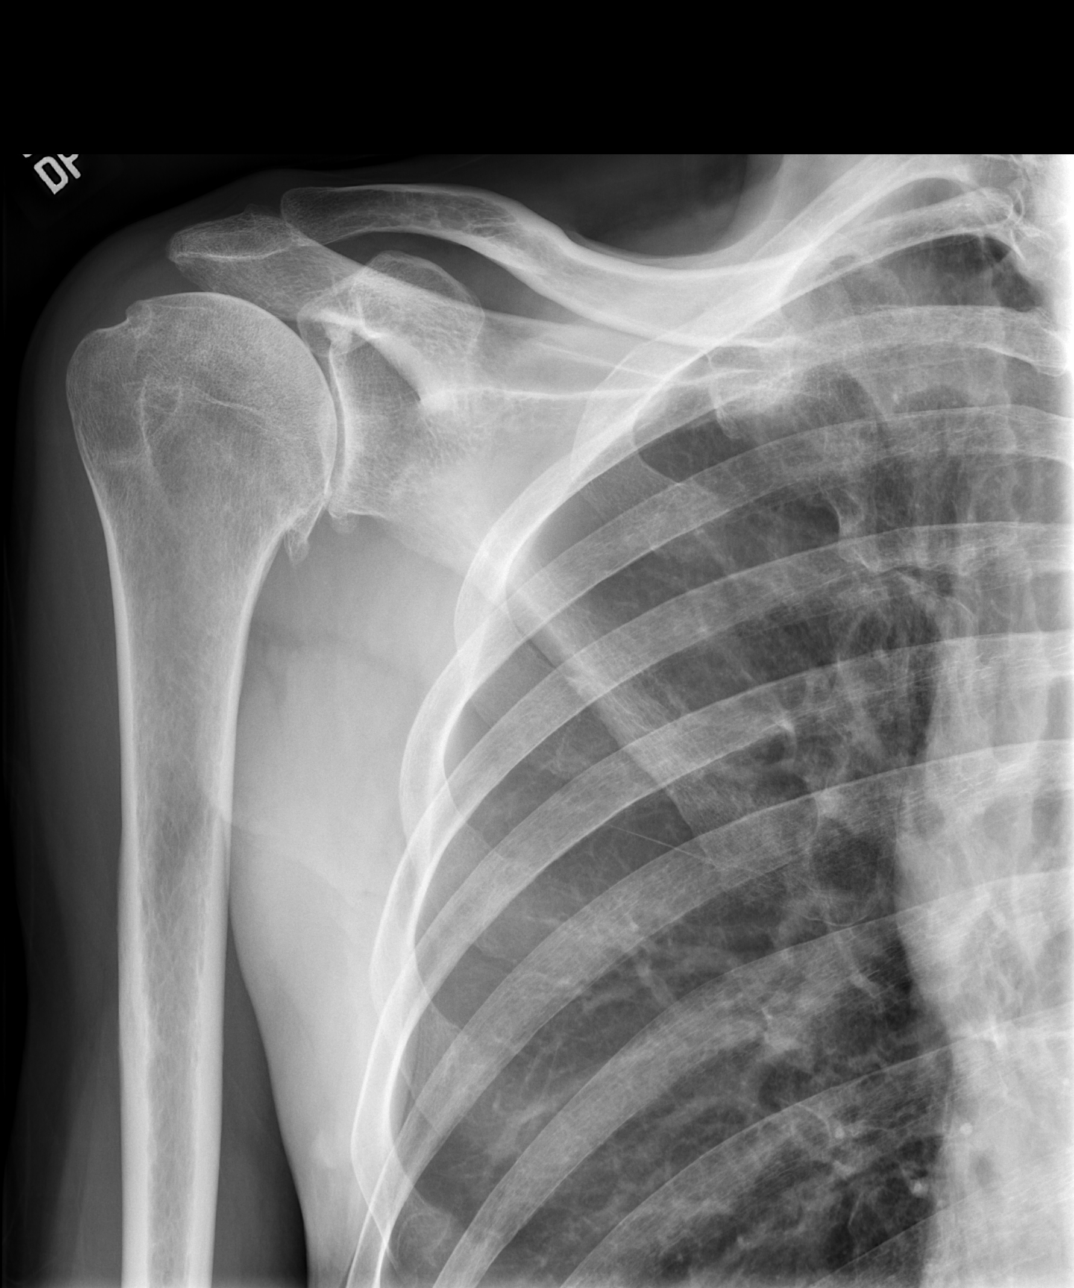

[w shoulder y view right]
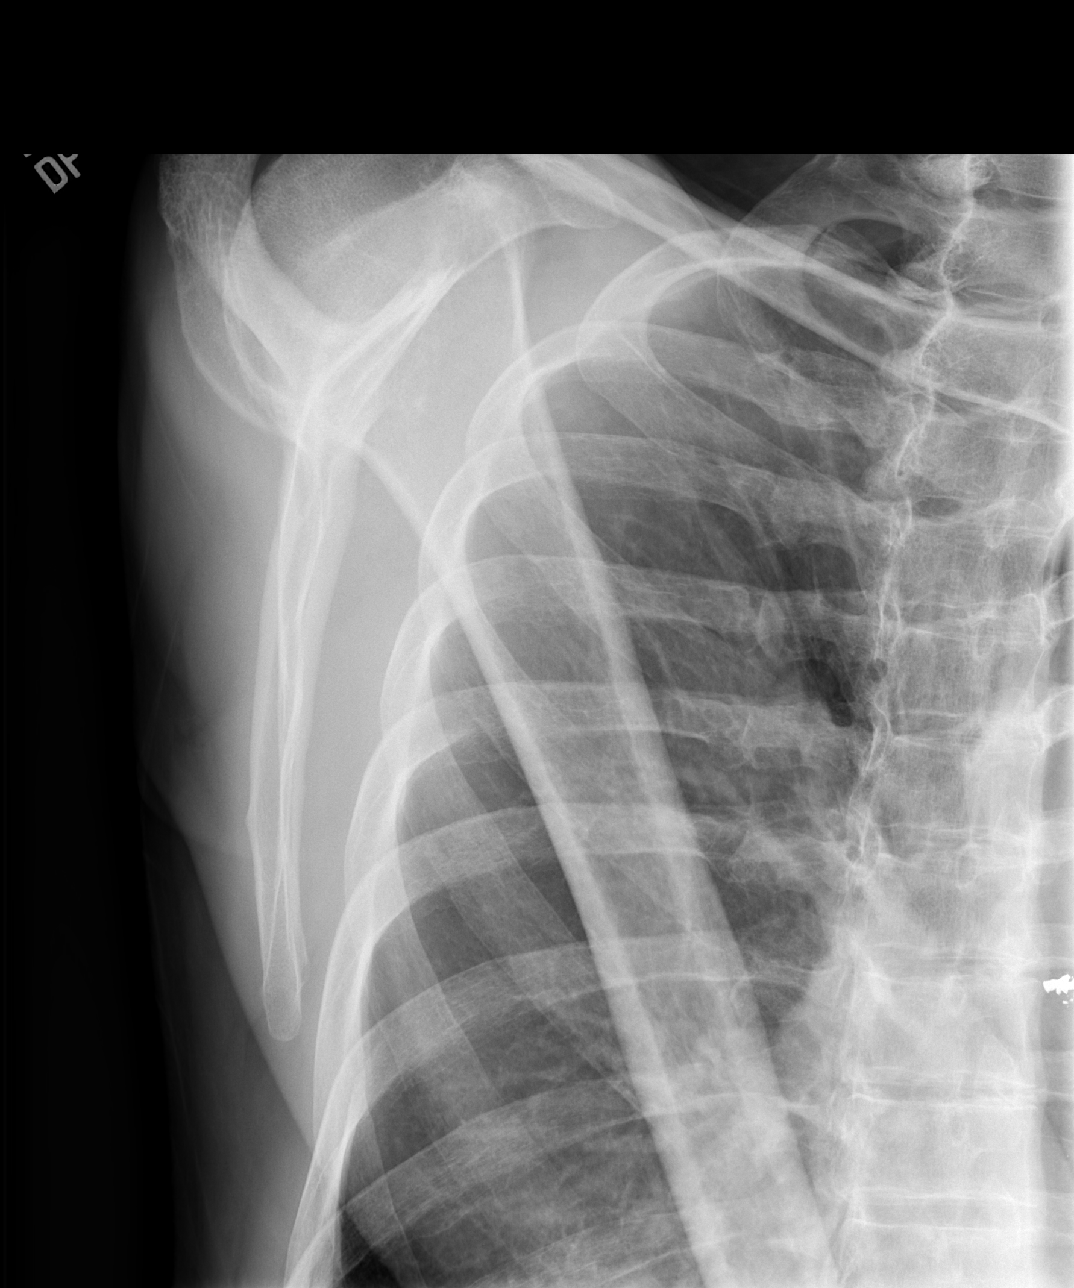

[w shoulder axillary right *]
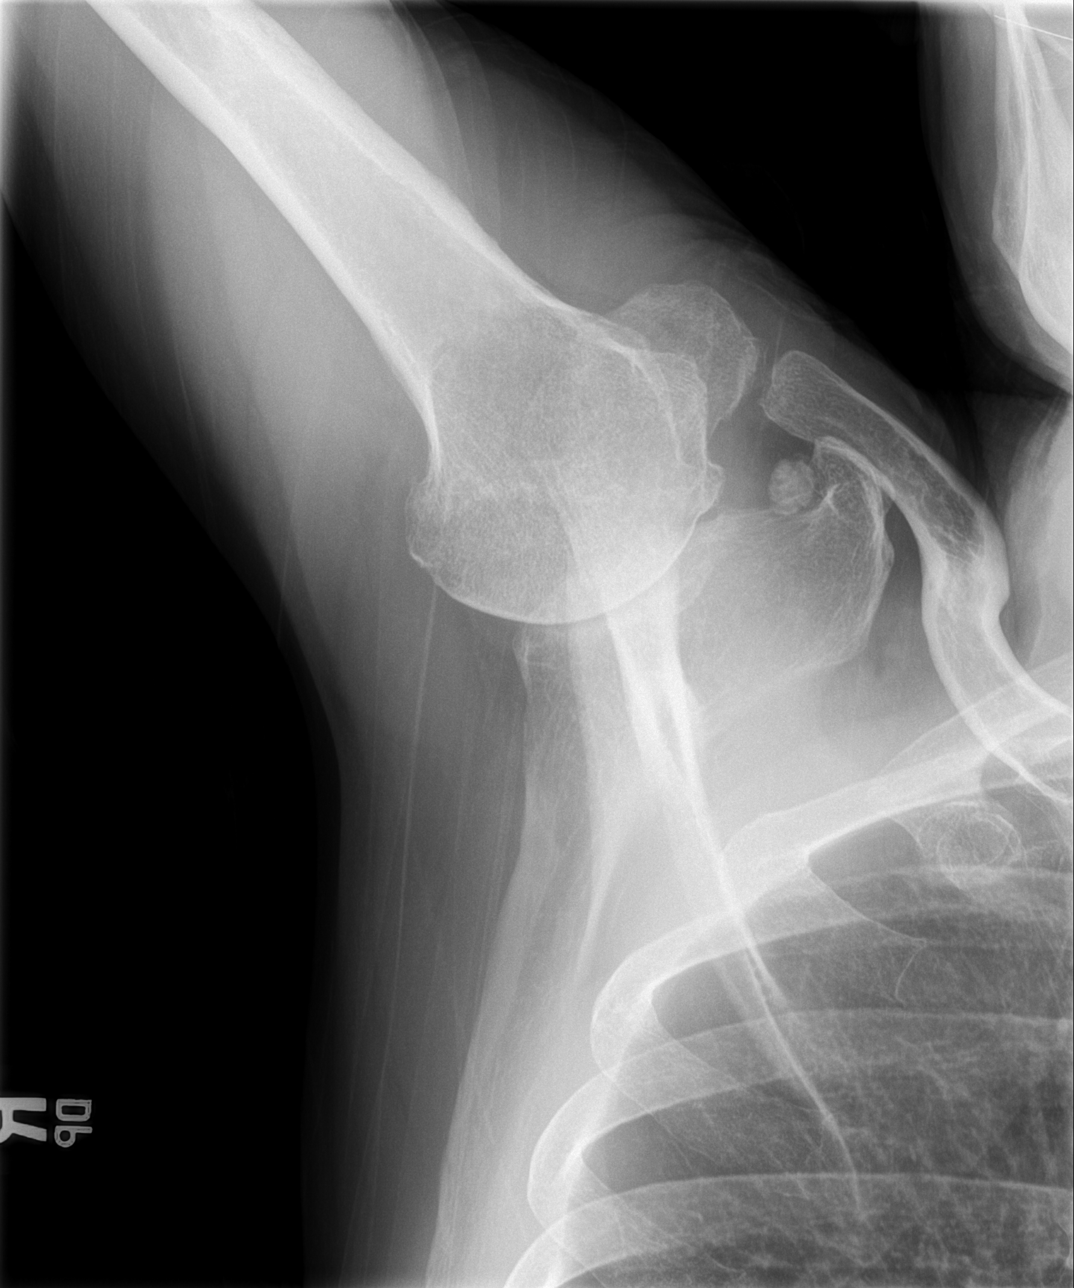

[3 of 3 positions shown; findings below may reference images not displayed]

FINDINGS: Moderate glenohumeral osteoarthritis with joint space narrowing and
inferior spurring arising from the humeral head and glenoid.
Acromioclavicular joint is congruent. There is a 13 mm ossified body
that is likely in the subacromial recess. No fracture, erosion, or
avascular necrosis. No soft tissue calcifications.
IMPRESSION: 1. Moderate glenohumeral osteoarthritis.
2. A 13 mm ossified body is likely in the subacromial recess.

## 2022-01-16 MED ORDER — BUDESONIDE-FORMOTEROL FUMARATE 160-4.5 MCG/ACT IN AERO
2.0000 | INHALATION_SPRAY | Freq: Two times a day (BID) | RESPIRATORY_TRACT | 12 refills | Status: DC
  Filled 2022-01-16: qty 10.2, 30d supply, fill #0
  Filled 2022-02-19: qty 10.2, 30d supply, fill #1
  Filled 2022-03-15: qty 10.2, 30d supply, fill #2
  Filled 2022-04-16: qty 10.2, 30d supply, fill #3

## 2022-01-16 MED ORDER — PREDNISONE 20 MG PO TABS
20.0000 mg | ORAL_TABLET | Freq: Every day | ORAL | 0 refills | Status: DC
  Filled 2022-01-16: qty 5, 5d supply, fill #0

## 2022-01-16 MED ORDER — AZITHROMYCIN 250 MG PO TABS
ORAL_TABLET | ORAL | 0 refills | Status: DC
  Filled 2022-01-16: qty 6, 5d supply, fill #0

## 2022-01-16 MED ORDER — ALBUTEROL SULFATE HFA 108 (90 BASE) MCG/ACT IN AERS
2.0000 | INHALATION_SPRAY | Freq: Four times a day (QID) | RESPIRATORY_TRACT | 1 refills | Status: DC | PRN
  Filled 2022-01-16: qty 6.7, 20d supply, fill #0
  Filled 2022-02-19: qty 6.7, 20d supply, fill #1

## 2022-01-16 NOTE — Assessment & Plan Note (Signed)
Blood pressure normal off cocaine we will monitor off medication ?

## 2022-01-16 NOTE — Patient Instructions (Signed)
Begin prednisone 20 mg take 1 daily for 5 days and stop ? ?Take azithromycin to the first day then 1 a day till you complete therapy in 5 days ? ?Refills on inhaler sent to the pharmacy ? ?Stop by the x-ray department on the first floor to get x-rays of your right shoulder ? ?Return to see Dr. Joya Gaskins 4 months ? ?We may yet need to refer you to orthopedics for your right shoulder pain ? ? ?

## 2022-01-16 NOTE — Assessment & Plan Note (Signed)
Still chewing tobacco but not smoking tobacco however is smoking marijuana ?

## 2022-01-16 NOTE — Assessment & Plan Note (Addendum)
Patient with recurrent exacerbation of airway disease we will give a course of azithromycin and 5 days of prednisone 20 mg daily refill Symbicort and albuterol ? ?I am hopeful in the last few teeth removed in the lower jaw his exacerbations will decrease as these teeth are very carious and full of infection ?

## 2022-01-16 NOTE — Assessment & Plan Note (Signed)
Not on any medications essentially is using marijuana to treat his bipolar condition ?

## 2022-01-16 NOTE — Assessment & Plan Note (Signed)
Acute pain of right shoulder will obtain x-rays of the shoulder patient will use Tylenol for pain ?

## 2022-01-17 ENCOUNTER — Other Ambulatory Visit: Payer: Self-pay | Admitting: Critical Care Medicine

## 2022-01-17 DIAGNOSIS — M19011 Primary osteoarthritis, right shoulder: Secondary | ICD-10-CM

## 2022-01-17 MED ORDER — MELOXICAM 15 MG PO TABS: 15.0000 mg | ORAL_TABLET | Freq: Every day | ORAL | 4 refills | Status: DC

## 2022-02-19 ENCOUNTER — Other Ambulatory Visit: Payer: Self-pay

## 2022-02-19 MED ORDER — ALBUTEROL SULFATE HFA 108 (90 BASE) MCG/ACT IN AERS
INHALATION_SPRAY | RESPIRATORY_TRACT | 0 refills | Status: DC
  Filled 2022-02-19: qty 18, 25d supply, fill #0

## 2022-03-14 ENCOUNTER — Encounter: Payer: Self-pay | Admitting: Critical Care Medicine

## 2022-03-14 DIAGNOSIS — K047 Periapical abscess without sinus: Secondary | ICD-10-CM

## 2022-03-15 ENCOUNTER — Encounter: Payer: Self-pay | Admitting: Critical Care Medicine

## 2022-03-15 ENCOUNTER — Other Ambulatory Visit: Payer: Self-pay

## 2022-03-15 ENCOUNTER — Other Ambulatory Visit: Payer: Self-pay | Admitting: Critical Care Medicine

## 2022-03-15 DIAGNOSIS — K047 Periapical abscess without sinus: Secondary | ICD-10-CM

## 2022-03-15 HISTORY — DX: Periapical abscess without sinus: K04.7

## 2022-03-15 MED ORDER — ALBUTEROL SULFATE HFA 108 (90 BASE) MCG/ACT IN AERS
INHALATION_SPRAY | RESPIRATORY_TRACT | 0 refills | Status: DC
  Filled 2022-03-15: qty 18, 25d supply, fill #0

## 2022-03-15 MED ORDER — AMOXICILLIN-POT CLAVULANATE 875-125 MG PO TABS
1.0000 | ORAL_TABLET | Freq: Two times a day (BID) | ORAL | 0 refills | Status: DC
  Filled 2022-03-15: qty 20, 10d supply, fill #0

## 2022-03-15 NOTE — Patient Instructions (Signed)
Take augmentin one twice a day until gone   ? ?Seek dental care  ?

## 2022-03-15 NOTE — Assessment & Plan Note (Signed)
Please see the MyChart message reply(ies) for my assessment and plan.    This patient gave consent for this Medical Advice Message and is aware that it may result in a bill to their insurance company, as well as the possibility of receiving a bill for a co-payment or deductible. They are an established patient, but are not seeking medical advice exclusively about a problem treated during an in person or video visit in the last seven days. I did not recommend an in person or video visit within seven days of my reply.    I spent a total of 10 minutes cumulative time within 7 days through MyChart messaging.  Kailand Seda, MD   

## 2022-03-15 NOTE — Progress Notes (Signed)
Please see the MyChart message reply(ies) for my assessment and plan.    This patient gave consent for this Medical Advice Message and is aware that it may result in a bill to their insurance company, as well as the possibility of receiving a bill for a co-payment or deductible. They are an established patient, but are not seeking medical advice exclusively about a problem treated during an in person or video visit in the last seven days. I did not recommend an in person or video visit within seven days of my reply.    I spent a total of 10 minutes cumulative time within 7 days through MyChart messaging.  Kenrick Pore, MD   

## 2022-04-16 ENCOUNTER — Other Ambulatory Visit: Payer: Self-pay

## 2022-04-16 ENCOUNTER — Other Ambulatory Visit: Payer: Self-pay | Admitting: Critical Care Medicine

## 2022-04-16 MED ORDER — ALBUTEROL SULFATE HFA 108 (90 BASE) MCG/ACT IN AERS
INHALATION_SPRAY | RESPIRATORY_TRACT | 0 refills | Status: DC
  Filled 2022-04-16: qty 18, 25d supply, fill #0
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
## 2022-05-21 ENCOUNTER — Encounter: Payer: Self-pay | Admitting: Critical Care Medicine

## 2022-05-21 ENCOUNTER — Ambulatory Visit: Payer: Medicaid Other | Attending: Critical Care Medicine | Admitting: Critical Care Medicine

## 2022-05-21 VITALS — BP 151/96 | HR 79 | Wt 146.0 lb

## 2022-05-21 DIAGNOSIS — F17210 Nicotine dependence, cigarettes, uncomplicated: Secondary | ICD-10-CM | POA: Diagnosis not present

## 2022-05-21 DIAGNOSIS — Z1211 Encounter for screening for malignant neoplasm of colon: Secondary | ICD-10-CM

## 2022-05-21 DIAGNOSIS — W57XXXA Bitten or stung by nonvenomous insect and other nonvenomous arthropods, initial encounter: Secondary | ICD-10-CM | POA: Diagnosis not present

## 2022-05-21 DIAGNOSIS — G8929 Other chronic pain: Secondary | ICD-10-CM

## 2022-05-21 DIAGNOSIS — J449 Chronic obstructive pulmonary disease, unspecified: Secondary | ICD-10-CM | POA: Diagnosis not present

## 2022-05-21 DIAGNOSIS — K047 Periapical abscess without sinus: Secondary | ICD-10-CM

## 2022-05-21 DIAGNOSIS — S20461A Insect bite (nonvenomous) of right back wall of thorax, initial encounter: Secondary | ICD-10-CM

## 2022-05-21 DIAGNOSIS — M25511 Pain in right shoulder: Secondary | ICD-10-CM

## 2022-05-21 DIAGNOSIS — I10 Essential (primary) hypertension: Secondary | ICD-10-CM

## 2022-05-21 DIAGNOSIS — Z720 Tobacco use: Secondary | ICD-10-CM

## 2022-05-21 DIAGNOSIS — F1190 Opioid use, unspecified, uncomplicated: Secondary | ICD-10-CM

## 2022-05-21 HISTORY — DX: Bitten or stung by nonvenomous insect and other nonvenomous arthropods, initial encounter: W57.XXXA

## 2022-05-21 HISTORY — DX: Insect bite (nonvenomous) of right back wall of thorax, initial encounter: S20.461A

## 2022-05-21 MED ORDER — ALBUTEROL SULFATE HFA 108 (90 BASE) MCG/ACT IN AERS: INHALATION_SPRAY | RESPIRATORY_TRACT | 0 refills | Status: DC

## 2022-05-21 MED ORDER — DOXYCYCLINE HYCLATE 100 MG PO TABS: 100.0000 mg | ORAL_TABLET | Freq: Two times a day (BID) | ORAL | 0 refills | Status: AC

## 2022-05-21 MED ORDER — NALOXONE HCL 4 MG/0.1ML NA LIQD: NASAL | 1 refills | Status: DC

## 2022-05-21 MED ORDER — ADVAIR DISKUS 250-50 MCG/ACT IN AEPB: 1.0000 | INHALATION_SPRAY | Freq: Two times a day (BID) | RESPIRATORY_TRACT | 11 refills | Status: DC

## 2022-05-21 MED ORDER — AMLODIPINE BESYLATE 5 MG PO TABS: 5.0000 mg | ORAL_TABLET | Freq: Every day | ORAL | 2 refills | Status: DC

## 2022-05-21 NOTE — Assessment & Plan Note (Signed)
We will screen for Lyme disease and prescribe doxycycline twice daily for 7 days

## 2022-05-21 NOTE — Assessment & Plan Note (Signed)
  .   Current smoking consumption amount: Chews tobacco only daily  . Dicsussion on advise to quit smoking and smoking impacts: Cardiovascular oral health  . Patient's willingness to quit: Not ready to quit  . Methods to quit smoking discussed: Behavioral modification  . Medication management of smoking session drugs discussed: Not indicated  . Resources provided:  AVS   . Setting quit date not established  . Follow-up arranged 1 month   Time spent counseling the patient: 5 minutes

## 2022-05-21 NOTE — Assessment & Plan Note (Signed)
This has resolved we will monitor 

## 2022-05-21 NOTE — Assessment & Plan Note (Signed)
Patient may relapse and take heroin again plan is to refer patient treatment clinic

## 2022-05-21 NOTE — Assessment & Plan Note (Signed)
Chronic right shoulder pain will be referred to orthopedics

## 2022-05-21 NOTE — Assessment & Plan Note (Signed)
Patient now has Medicaid will cover Advair disc we will prescribe 1 puff twice daily 250 mcg strength continue albuterol as needed

## 2022-05-21 NOTE — Patient Instructions (Signed)
Start Advair 1 elation twice daily this replaces your Symbicort with your new insurance Refills on albuterol sent to your pharmacy Take doxycycline twice a day for 7 days for the tick bite infection of the chest wall and a Lyme disease titer will be obtained  Referral to medication assisted therapy for opioid use will be given  Narcan was sent to your pharmacy to take if needed for opioid accidental overdose that your family can administer  Colon cancer screening was referred to gastroenterology for colonoscopy  Start amlodipine 1 pill daily for elevated blood pressure  Screening labs include metabolic panel and blood counts at this visit  Return to see Dr. Joya Gaskins 1 month  Referral to orthopedic surgery for chronic right shoulder pain given

## 2022-05-21 NOTE — Assessment & Plan Note (Signed)
Hypertension currently not controlled he has not been on medications previously  I have advised smoking reduction and begin amlodipine 5 mg daily  We will check metabolic panel have patient return in 1 month

## 2022-05-22 DIAGNOSIS — E559 Vitamin D deficiency, unspecified: Secondary | ICD-10-CM | POA: Diagnosis not present

## 2022-05-22 DIAGNOSIS — Z0130 Encounter for examination of blood pressure without abnormal findings: Secondary | ICD-10-CM | POA: Diagnosis not present

## 2022-05-22 DIAGNOSIS — Z681 Body mass index (BMI) 19 or less, adult: Secondary | ICD-10-CM | POA: Diagnosis not present

## 2022-05-22 DIAGNOSIS — Z0000 Encounter for general adult medical examination without abnormal findings: Secondary | ICD-10-CM | POA: Diagnosis not present

## 2022-05-22 DIAGNOSIS — R5383 Other fatigue: Secondary | ICD-10-CM | POA: Diagnosis not present

## 2022-05-22 DIAGNOSIS — Z79899 Other long term (current) drug therapy: Secondary | ICD-10-CM | POA: Diagnosis not present

## 2022-05-22 LAB — COMPREHENSIVE METABOLIC PANEL
ALT: 11 IU/L (ref 0–44)
AST: 14 IU/L (ref 0–40)
Albumin/Globulin Ratio: 2.3 — ABNORMAL HIGH (ref 1.2–2.2)
Albumin: 4.8 g/dL (ref 3.8–4.9)
Alkaline Phosphatase: 65 IU/L (ref 44–121)
BUN/Creatinine Ratio: 7 — ABNORMAL LOW (ref 9–20)
BUN: 7 mg/dL (ref 6–24)
Bilirubin Total: 0.4 mg/dL (ref 0.0–1.2)
CO2: 23 mmol/L (ref 20–29)
Calcium: 9.5 mg/dL (ref 8.7–10.2)
Chloride: 103 mmol/L (ref 96–106)
Creatinine, Ser: 0.98 mg/dL (ref 0.76–1.27)
Globulin, Total: 2.1 g/dL (ref 1.5–4.5)
Glucose: 115 mg/dL — ABNORMAL HIGH (ref 70–99)
Potassium: 4.3 mmol/L (ref 3.5–5.2)
Sodium: 141 mmol/L (ref 134–144)
Total Protein: 6.9 g/dL (ref 6.0–8.5)
eGFR: 91 mL/min/{1.73_m2} (ref >59)

## 2022-05-22 LAB — CBC WITH DIFFERENTIAL/PLATELET
Basophils Absolute: 0 10*3/uL (ref 0.0–0.2)
Basos: 0 %
EOS (ABSOLUTE): 0.1 10*3/uL (ref 0.0–0.4)
Eos: 1 %
Hematocrit: 43.6 % (ref 37.5–51.0)
Hemoglobin: 14.9 g/dL (ref 13.0–17.7)
Immature Grans (Abs): 0 10*3/uL (ref 0.0–0.1)
Immature Granulocytes: 0 %
Lymphocytes Absolute: 1.3 10*3/uL (ref 0.7–3.1)
Lymphs: 18 %
MCH: 30 pg (ref 26.6–33.0)
MCHC: 34.2 g/dL (ref 31.5–35.7)
MCV: 88 fL (ref 79–97)
Monocytes Absolute: 0.5 10*3/uL (ref 0.1–0.9)
Monocytes: 7 %
Neutrophils Absolute: 5.5 10*3/uL (ref 1.4–7.0)
Neutrophils: 74 %
Platelets: 247 10*3/uL (ref 150–450)
RBC: 4.96 x10E6/uL (ref 4.14–5.80)
RDW: 12 % (ref 11.6–15.4)
WBC: 7.4 10*3/uL (ref 3.4–10.8)

## 2022-05-22 LAB — LYME DISEASE SEROLOGY W/REFLEX: Lyme Total Antibody EIA: NEGATIVE

## 2022-05-22 NOTE — Progress Notes (Signed)
Let patient know labs are all normal and he does not have Lyme disease but do take the doxycycline prescribed

## 2022-05-23 ENCOUNTER — Telehealth: Payer: Self-pay

## 2022-05-23 DIAGNOSIS — Z79899 Other long term (current) drug therapy: Secondary | ICD-10-CM | POA: Diagnosis not present

## 2022-05-23 NOTE — Telephone Encounter (Signed)
Pt was called and is aware of results, DOB was confirmed.  ?

## 2022-05-23 NOTE — Telephone Encounter (Signed)
-----   Message from [name], MD sent at 05/22/2022  1:42 PM EDT ----- Let patient know labs are all normal and he does not have Lyme disease but do take the doxycycline prescribed

## 2022-05-24 ENCOUNTER — Ambulatory Visit: Payer: Medicaid Other | Admitting: Orthopaedic Surgery

## 2022-05-24 ENCOUNTER — Encounter: Payer: Self-pay | Admitting: Orthopaedic Surgery

## 2022-05-24 DIAGNOSIS — M19011 Primary osteoarthritis, right shoulder: Secondary | ICD-10-CM | POA: Diagnosis not present

## 2022-05-24 MED ORDER — TRAMADOL HCL 50 MG PO TABS: 50.0000 mg | ORAL_TABLET | Freq: Two times a day (BID) | ORAL | 2 refills | Status: DC | PRN
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
## 2022-06-04 ENCOUNTER — Ambulatory Visit (HOSPITAL_BASED_OUTPATIENT_CLINIC_OR_DEPARTMENT_OTHER): Payer: Medicaid Other | Admitting: Orthopaedic Surgery

## 2022-06-07 ENCOUNTER — Other Ambulatory Visit: Payer: Self-pay | Admitting: Critical Care Medicine

## 2022-06-07 ENCOUNTER — Ambulatory Visit (INDEPENDENT_AMBULATORY_CARE_PROVIDER_SITE_OTHER): Payer: Medicaid Other | Admitting: Orthopaedic Surgery

## 2022-06-07 DIAGNOSIS — M25511 Pain in right shoulder: Secondary | ICD-10-CM | POA: Diagnosis not present

## 2022-06-07 DIAGNOSIS — M19011 Primary osteoarthritis, right shoulder: Secondary | ICD-10-CM | POA: Diagnosis not present

## 2022-06-07 MED ORDER — LIDOCAINE HCL 1 % IJ SOLN
4.0000 mL | INTRAMUSCULAR | Status: AC | PRN
  Administered 2022-06-07: 4 mL

## 2022-06-07 MED ORDER — TRIAMCINOLONE ACETONIDE 40 MG/ML IJ SUSP
80.0000 mg | INTRAMUSCULAR | Status: AC | PRN
  Administered 2022-06-07: 80 mg via INTRA_ARTICULAR
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
## 2022-06-08 ENCOUNTER — Encounter: Payer: Self-pay | Admitting: Critical Care Medicine

## 2022-06-08 ENCOUNTER — Ambulatory Visit (HOSPITAL_BASED_OUTPATIENT_CLINIC_OR_DEPARTMENT_OTHER): Payer: Medicaid Other | Admitting: Orthopaedic Surgery

## 2022-06-08 MED ORDER — ALBUTEROL SULFATE HFA 108 (90 BASE) MCG/ACT IN AERS
INHALATION_SPRAY | RESPIRATORY_TRACT | 0 refills | Status: DC
Start: 2022-06-08 — End: 2022-06-20

## 2022-06-20 ENCOUNTER — Other Ambulatory Visit: Payer: Self-pay | Admitting: Critical Care Medicine

## 2022-06-20 MED ORDER — ALBUTEROL SULFATE HFA 108 (90 BASE) MCG/ACT IN AERS
INHALATION_SPRAY | RESPIRATORY_TRACT | 0 refills | Status: DC
Start: 2022-06-20 — End: 2022-07-11

## 2022-06-29 ENCOUNTER — Ambulatory Visit (HOSPITAL_COMMUNITY)
Admission: EM | Admit: 2022-06-29 | Discharge: 2022-06-29 | Disposition: A | Discharge status: Home / Self Care | Payer: Medicaid Other | Attending: Psychiatry | Admitting: Psychiatry

## 2022-06-29 DIAGNOSIS — F1990 Other psychoactive substance use, unspecified, uncomplicated: Secondary | ICD-10-CM

## 2022-06-29 DIAGNOSIS — F1190 Opioid use, unspecified, uncomplicated: Secondary | ICD-10-CM

## 2022-06-29 NOTE — Progress Notes (Signed)
   06/29/22 1424  [place] (Walk-ins at [facility] only)  How Did You Hear About [place]? Self  What Is the Reason for Your Visit/Call Today? Kenneth Mcdowell 56 year old male present to [facility] requesting inpatient substance abuse treatment to avoid relapse. Report he's been clean less than a year via buying suboxone strips off the street. Substance of choice heroin, fentanyl and THC. Report today is the death anniversary of his wife who passed away 41 years-old, they were together 29-years. Hx of mental health Bipolar, ADHD and PTSD. Denied SI/HI and AVS. Report last used heroin and fentanyl January 18, 2021. Report he normally takes 3 suboxone strips per day and has not had any since Saturday 06/23/2022. Withdrawal symptoms from Suboxone, fidgetty, irritability, vomit, shaking, anxious, and aches/pains.  How Long Has This Been Causing You Problems? <Week  Have You Recently Had Any Thoughts About Hurting Yourself? No  Are You Planning to Commit Suicide/Harm Yourself At This time? No  Have you Recently Had Thoughts About [place]? No  Are You Planning To Harm Someone At This Time? No  Are you currently experiencing any auditory, visual or other hallucinations? No  Have You Used Any Alcohol or Drugs in the Past 24 Hours? Yes  How long ago did you use Drugs or Alcohol? THC  What Did You Use and How Much? smoke THC up to 4x daily  Do you have any current medical co-morbidities that require immediate attention? Yes  Please describe current medical co-morbidities that require immediate attention: medical complications; COPT  Clinician description of patient physical appearance/behavior: Client is dishelved, yet pleasant and cooperative  What Do You Feel Would Help You the Most Today? Alcohol or Drug Use Treatment  If access to [facility] was not available, would you have sought care in the Emergency Department? No  Determination of Need Urgent (48 hours)  Options For Referral Chemical  Dependency Intensive Outpatient Therapy (CDIOP)

## 2022-06-29 NOTE — ED Provider Notes (Signed)
Behavioral Health Urgent Care Medical Screening Exam  Patient Name: Kenneth Mcdowell MRN: [id number] Date of Evaluation: 06/29/22 Chief Complaint:   Diagnosis:  Final diagnoses:  Substance use disorder  Opioid use disorder    History of Present illness: Kenneth Mcdowell is a 56 y.o. male. Patient prefers "Kenneth Mcdowell." Patient presents voluntarily to [facility] behavioral health for walk-in assessment.   Patient is assessed, face-to-face, by nurse practitioner, seated in assessment area, no acute distress.  He  is alert and oriented, pleasant and cooperative during assessment.  Kenneth Mcdowell would like to be prescribed Suboxone while attempting to follow-up with Suboxone clinic today.  Patient offered facility based crisis admission with medication management options, he declines.  Patient  presents with euthymic mood, congruent affect. He  denies suicidal and homicidal ideations. Denies history of suicide attempts, denies history of non suicidal self-harm behavior.  Patient easily  contracts verbally for safety with this Probation officer.  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  Last use of heroin and fentanyl 18 months ago.  He has used Suboxone, purchased from the street, daily for the last 18 months.  Most recent Suboxone approximately 1 week ago.  He has a history of alcohol use disorder, most recent alcohol use 25 years ago.  He endorses daily marijuana use, last used marijuana earlier this date.  He would like to follow-up within outpatient Suboxone clinic as he can no longer afford $30 daily to buy Suboxone from the street.  He is concerned he may relapse as today is a "difficult day," the anniversary of his wife's death.  Patient remains committed to his sobriety, he is not interested in residential substance use treatment at this time. He is in application process for disability benefits to assist with financial challenges when seeking MAT.   Patient has been diagnosed with bipolar  disorder, PTSD and ADHD.  He is not linked with outpatient psychiatry currently.  No current medications.  He denies history of inpatient psychiatric hospitalization.  Family mental health history includes patient's father was diagnosed with alcohol use disorder    Patient has normal speech and behavior.  He  denies auditory and visual hallucinations.  Patient is able to converse coherently with goal-directed thoughts and no distractibility or preoccupation.  Denies symptoms of paranoia.  Objectively there is no evidence of psychosis/mania or delusional thinking.  Kenneth Mcdowell resides in [place] with his mother.  He denies access to weapons.  He is employed in the [employer], works at [employer] intermittently.  He enjoys his work, is enrolled in a union. Patient endorses average sleep and appetite.  Reviewed elevated BP. Patient has been prescribed antihypertensive, not currently taking medications. He denies chest pain, denies shortness of breath, denies headache, denies dizziness. Reviewed plan to follow up with primary care provider.   Patient offered support and encouragement. He declines any person to contact for collateral information at this time.    Patient educated and verbalizes understanding of mental health resources and other crisis services in the community. They are instructed to call 911 and present to the nearest emergency room should patient experience any suicidal/homicidal ideation, auditory/visual/hallucinations, or detrimental worsening of mental health condition.    Psychiatric Specialty Exam  Presentation  General Appearance:Appropriate for Environment; Casual  Eye Contact:Good  Speech:Clear and Coherent; Normal Rate  Speech Volume:Normal  Handedness:Right   Mood and Affect  Mood:Euthymic  Affect:Appropriate; Congruent   Thought Process  Thought Processes:Coherent; Goal Directed; Linear  Descriptions of Associations:Intact  Orientation:Full  (Time, Place and Person)  Thought Content:Logical; WDL    Hallucinations:None  Ideas of Reference:None  Suicidal Thoughts:No  Homicidal Thoughts:No   Sensorium  Memory:Immediate Good; Recent Good  Judgment:Good  Insight:Fair   Executive Functions  Concentration:Good  Attention Span:Good  Edie of Knowledge:Good  Language:Good   Psychomotor Activity  Psychomotor Activity:Normal   Assets  Assets:Communication Skills; Desire for Improvement; Financial Resources/Insurance; Housing; Intimacy; Leisure Time; Physical Health; Social Support; Resilience; Talents/Skills   Sleep  Sleep:Fair  Number of hours: No data recorded  No data recorded  Physical Exam: Physical Exam Vitals and nursing note reviewed.  Constitutional:      General: He is not in acute distress.    Appearance: Normal appearance. He is well-developed and normal weight.  HENT:     Head: Normocephalic and atraumatic.     Nose: Nose normal.  Eyes:     Conjunctiva/sclera: Conjunctivae normal.  Cardiovascular:     Rate and Rhythm: Normal rate.     Heart sounds: No murmur heard. Pulmonary:     Effort: Pulmonary effort is normal. No respiratory distress.     Breath sounds: Normal breath sounds.  Abdominal:     Palpations: Abdomen is soft.     Tenderness: There is no abdominal tenderness.  Musculoskeletal:        General: No swelling. Normal range of motion.     Cervical back: Normal range of motion.  Skin:    General: Skin is warm and dry.  Neurological:     Mental Status: He is alert and oriented to person, place, and time.  Psychiatric:        Attention and Perception: Attention and perception normal.        Mood and Affect: Mood and affect normal.        Speech: Speech normal.        Behavior: Behavior normal. Behavior is cooperative.        Thought Content: Thought content normal.        Cognition and Memory: Cognition and memory normal.        Judgment: Judgment normal.     Review of Systems  Constitutional: Negative.   HENT: Negative.    Eyes: Negative.   Respiratory: Negative.    Cardiovascular: Negative.   Gastrointestinal: Negative.   Genitourinary: Negative.   Musculoskeletal: Negative.   Skin: Negative.   Neurological: Negative.   Psychiatric/Behavioral:  Positive for substance abuse.    Blood pressure (!) 136/102, pulse 74, temperature 98.4 F (36.9 C), temperature source Oral, resp. rate 20, SpO2 100 %. There is no height or weight on file to calculate BMI.  Musculoskeletal: Strength & Muscle Tone: within normal limits Gait & Station: normal Patient leans: N/A   [place] [facility] Discharge Disposition for Follow up and Recommendations: Based on my evaluation the patient does not appear to have an emergency medical condition and can be discharged with resources and follow up care in outpatient services for Medication Management and Individual Therapy Patient reviewed with Dr Hampton Abbot.  Follow up with outpatient psychiatry, resources provided. Follow up with primary care provider.   Follow up with substance use treatment resources provided.    Lucky Rathke, FNP 06/29/2022, 3:18 PM

## 2022-06-29 NOTE — BH Assessment (Signed)
LCSW Progress Note  Per Beatriz Stallion, FNP, this pt does not require psychiatric hospitalization at this time.  Pt is psychiatrically cleared.  Discharge instructions include resources for MAT.  EDP Beatriz Stallion, FNP, has been notified.  Omelia Blackwater, MSW, [place] [phone] or [phone]

## 2022-06-29 NOTE — Discharge Instructions (Addendum)
Patient is instructed prior to discharge to:  Take all medications as prescribed by his/her mental healthcare provider. Report any adverse effects and or reactions from the medicines to his/her outpatient provider promptly. Keep all scheduled appointments, to ensure that you are getting refills on time and to avoid any interruption in your medication.  If you are unable to keep an appointment call to reschedule.  Be sure to follow-up with resources and follow-up appointments provided.  Patient has been instructed & cautioned: To not engage in alcohol and or illegal drug use while on prescription medicines. In the event of worsening symptoms, patient is instructed to call the crisis hotline, 911 and or go to the nearest ED for appropriate evaluation and treatment of symptoms. To follow-up with his/her primary care provider for your other medical issues, concerns and or health care needs.    OPIOID REPLACEMENT PROVIDERS:  [facility] [place], [place] [address] [phone]  [place] [address], [place] [address] [phone]  [facility] [address] [place], [place] [address] [phone]  [place] services [address] [place], [place], [address] [phone] phone

## 2022-07-11 ENCOUNTER — Encounter: Payer: Self-pay | Admitting: Critical Care Medicine

## 2022-07-11 ENCOUNTER — Ambulatory Visit: Payer: Medicaid Other | Attending: Critical Care Medicine | Admitting: Critical Care Medicine

## 2022-07-11 VITALS — BP 113/77 | HR 72 | Temp 98.2°F | Resp 16 | Ht 75.0 in | Wt 145.0 lb

## 2022-07-11 DIAGNOSIS — K648 Other hemorrhoids: Secondary | ICD-10-CM

## 2022-07-11 DIAGNOSIS — S20461A Insect bite (nonvenomous) of right back wall of thorax, initial encounter: Secondary | ICD-10-CM | POA: Diagnosis not present

## 2022-07-11 DIAGNOSIS — F1190 Opioid use, unspecified, uncomplicated: Secondary | ICD-10-CM

## 2022-07-11 DIAGNOSIS — F17290 Nicotine dependence, other tobacco product, uncomplicated: Secondary | ICD-10-CM

## 2022-07-11 DIAGNOSIS — I10 Essential (primary) hypertension: Secondary | ICD-10-CM | POA: Diagnosis not present

## 2022-07-11 DIAGNOSIS — W57XXXA Bitten or stung by nonvenomous insect and other nonvenomous arthropods, initial encounter: Secondary | ICD-10-CM | POA: Diagnosis not present

## 2022-07-11 DIAGNOSIS — J449 Chronic obstructive pulmonary disease, unspecified: Secondary | ICD-10-CM | POA: Diagnosis not present

## 2022-07-11 DIAGNOSIS — Z1211 Encounter for screening for malignant neoplasm of colon: Secondary | ICD-10-CM

## 2022-07-11 DIAGNOSIS — J4489 Other specified chronic obstructive pulmonary disease: Secondary | ICD-10-CM

## 2022-07-11 DIAGNOSIS — Z720 Tobacco use: Secondary | ICD-10-CM

## 2022-07-11 MED ORDER — SULFAMETHOXAZOLE-TRIMETHOPRIM 800-160 MG PO TABS
1.0000 | ORAL_TABLET | Freq: Two times a day (BID) | ORAL | 0 refills | Status: AC
Start: 2022-07-11 — End: 2022-07-16

## 2022-07-11 MED ORDER — PREDNISONE 10 MG PO TABS: ORAL_TABLET | ORAL | 0 refills | Status: DC

## 2022-07-11 MED ORDER — VENTOLIN HFA 108 (90 BASE) MCG/ACT IN AERS: 2.0000 | INHALATION_SPRAY | Freq: Four times a day (QID) | RESPIRATORY_TRACT | 0 refills | Status: DC | PRN

## 2022-07-11 MED ORDER — HYDROCORTISONE (PERIANAL) 2.5 % EX CREA: 1.0000 | TOPICAL_CREAM | Freq: Two times a day (BID) | CUTANEOUS | 0 refills | Status: DC | PRN

## 2022-07-11 NOTE — Assessment & Plan Note (Signed)
Prolapsed hemorrhoid will give prescription for Anusol cream

## 2022-07-11 NOTE — Assessment & Plan Note (Signed)
High risk for relapse patient has Narcan on hand he has declined outpatient services that have been given to him at this point

## 2022-07-11 NOTE — Assessment & Plan Note (Signed)
Area of tick bite now has some purulence we will administer Bactrim for this

## 2022-07-11 NOTE — Assessment & Plan Note (Signed)
Patient given nicotine cessation advice

## 2022-07-11 NOTE — Assessment & Plan Note (Signed)
Blood pressure improved we will monitor off amlodipine for now

## 2022-07-11 NOTE — Patient Instructions (Signed)
Referral to gastroenterology for colonoscopy will be obtained  Hemorrhoid rectal cream issued  Take Bactrim twice a day for 5 days for acute bronchitis and for skin infection at the tick bite site on her chest wall  Refills on your albuterol inhaler sent to pharmacy  Take prednisone 40 mg daily for 5 days for bronchitis  We discussed options for outpatient substance use counseling our licensed clinical social worker Ms. [place] Crane is available and can contact you you declined this service at this visit today  Blood pressure is improved today please reduce the amount of snuff you are taking in as this is likely running your blood pressure up also with the stress you are undergoing this is playing a role but blood pressure was good today therefore we will hold the amlodipine for now  Return to Dr. Joya Gaskins 4 months

## 2022-07-11 NOTE — Assessment & Plan Note (Signed)
Plan a short course of prednisone and oral Bactrim for COPD flare
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
## 2022-07-19 ENCOUNTER — Telehealth: Payer: Medicaid Other | Admitting: Physician Assistant

## 2022-07-19 DIAGNOSIS — R079 Chest pain, unspecified: Secondary | ICD-10-CM

## 2022-07-19 DIAGNOSIS — J209 Acute bronchitis, unspecified: Secondary | ICD-10-CM

## 2022-07-19 NOTE — Progress Notes (Signed)
Because of chest pain that requires examination in person, along with other symptoms and taking into consideration your history of COPD, I feel your condition warrants further evaluation and I recommend that you be seen in a face to face visit.   NOTE: There will be NO CHARGE for this eVisit   If you are having a true medical emergency please call 911.      For an urgent face to face visit, [facility] has seven urgent care centers for your convenience:     [facility] [place] Get Driving Directions [phone] [place], [place] [address]    [facility]) Get Driving Directions [phone] [place], [place] [address]  [facility]) Get Driving Directions [phone] [address] [place],  [place]  [address]  [facility] - at [address] Get Driving Directions  [phone] [address] [place],  [place] [address]   [facility] Get Driving Directions [phone] [address] [place], [place], [place] [address]   [facility] Get Driving Directions  [phone] [address] [place], [place] [address]   [facility] [place] Get Driving Directions [phone] [address] [place], [place] [address]  Your MyChart E-visit questionnaire answers were reviewed by a board certified advanced clinical practitioner to complete your personal care plan based on your specific symptoms.  Thank you for using e-Visits.

## 2022-08-10 ENCOUNTER — Other Ambulatory Visit: Payer: Self-pay | Admitting: Critical Care Medicine

## 2022-08-10 ENCOUNTER — Encounter: Payer: Self-pay | Admitting: Physician Assistant

## 2022-08-10 MED ORDER — ALBUTEROL SULFATE HFA 108 (90 BASE) MCG/ACT IN AERS
2.0000 | INHALATION_SPRAY | Freq: Four times a day (QID) | RESPIRATORY_TRACT | 0 refills | Status: DC | PRN
Start: 2022-08-10 — End: 2022-10-10

## 2022-08-26 ENCOUNTER — Encounter: Payer: Self-pay | Admitting: Critical Care Medicine

## 2022-08-26 MED ORDER — MELOXICAM 15 MG PO TABS: 15.0000 mg | ORAL_TABLET | Freq: Every day | ORAL | 1 refills | Status: DC

## 2022-08-26 MED ORDER — METHOCARBAMOL 500 MG PO TABS: 500.0000 mg | ORAL_TABLET | Freq: Four times a day (QID) | ORAL | 1 refills | Status: DC | PRN

## 2022-09-03 ENCOUNTER — Encounter: Payer: Self-pay | Admitting: *Deleted
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
## 2022-09-12 ENCOUNTER — Encounter: Payer: Self-pay | Admitting: Physician Assistant

## 2022-09-12 ENCOUNTER — Other Ambulatory Visit (INDEPENDENT_AMBULATORY_CARE_PROVIDER_SITE_OTHER): Payer: Medicaid Other

## 2022-09-12 ENCOUNTER — Ambulatory Visit: Payer: Medicaid Other | Admitting: Physician Assistant

## 2022-09-12 VITALS — BP 132/74 | HR 75 | Ht 75.0 in | Wt 149.1 lb

## 2022-09-12 DIAGNOSIS — K625 Hemorrhage of anus and rectum: Secondary | ICD-10-CM | POA: Diagnosis not present

## 2022-09-12 DIAGNOSIS — R1319 Other dysphagia: Secondary | ICD-10-CM | POA: Diagnosis not present

## 2022-09-12 DIAGNOSIS — R112 Nausea with vomiting, unspecified: Secondary | ICD-10-CM

## 2022-09-12 DIAGNOSIS — K6289 Other specified diseases of anus and rectum: Secondary | ICD-10-CM | POA: Diagnosis not present

## 2022-09-12 DIAGNOSIS — R1084 Generalized abdominal pain: Secondary | ICD-10-CM

## 2022-09-12 LAB — CBC WITH DIFFERENTIAL/PLATELET
Basophils Absolute: 0 10*3/uL (ref 0.0–0.1)
Basophils Relative: 0.3 % (ref 0.0–3.0)
Eosinophils Absolute: 0.1 10*3/uL (ref 0.0–0.7)
Eosinophils Relative: 1.7 % (ref 0.0–5.0)
HCT: 43.7 % (ref 39.0–52.0)
Hemoglobin: 14.9 g/dL (ref 13.0–17.0)
Lymphocytes Relative: 20.5 % (ref 12.0–46.0)
Lymphs Abs: 1.6 10*3/uL (ref 0.7–4.0)
MCHC: 34.2 g/dL (ref 30.0–36.0)
MCV: 90 fl (ref 78.0–100.0)
Monocytes Absolute: 0.7 10*3/uL (ref 0.1–1.0)
Monocytes Relative: 9.1 % (ref 3.0–12.0)
Neutro Abs: 5.3 10*3/uL (ref 1.4–7.7)
Neutrophils Relative %: 68.4 % (ref 43.0–77.0)
Platelets: 217 10*3/uL (ref 150.0–400.0)
RBC: 4.85 Mil/uL (ref 4.22–5.81)
RDW: 13.2 % (ref 11.5–15.5)
WBC: 7.7 10*3/uL (ref 4.0–10.5)

## 2022-09-12 LAB — COMPREHENSIVE METABOLIC PANEL
ALT: 12 U/L (ref 0–53)
AST: 9 U/L (ref 0–37)
Albumin: 4.5 g/dL (ref 3.5–5.2)
Alkaline Phosphatase: 40 U/L (ref 39–117)
BUN: 20 mg/dL (ref 6–23)
CO2: 29 mEq/L (ref 19–32)
Calcium: 9.4 mg/dL (ref 8.4–10.5)
Chloride: 104 mEq/L (ref 96–112)
Creatinine, Ser: 0.93 mg/dL (ref 0.40–1.50)
GFR: 92.56 mL/min (ref >60.00)
Glucose, Bld: 92 mg/dL (ref 70–99)
Potassium: 4.2 mEq/L (ref 3.5–5.1)
Sodium: 140 mEq/L (ref 135–145)
Total Bilirubin: 0.3 mg/dL (ref 0.2–1.2)
Total Protein: 6.8 g/dL (ref 6.0–8.3)

## 2022-09-12 NOTE — Patient Instructions (Addendum)
Your provider has requested that you go to the basement level for lab work before leaving today. Press "B" on the elevator. The lab is located at the first door on the left as you exit the elevator.  Due to recent changes in healthcare laws, you may see the results of your imaging and laboratory studies on MyChart before your provider has had a chance to review them.  We understand that in some cases there may be results that are confusing or concerning to you. Not all laboratory results come back in the same time frame and the provider may be waiting for multiple results in order to interpret others.  Please give [place] 48 hours in order for your provider to thoroughly review all the results before contacting the office for clarification of your results.  You have been scheduled for an endoscopy and colonoscopy. Please follow the written instructions given to you at your visit today. Please pick up your prep supplies at the pharmacy within the next 1-3 days. If you use inhalers (even only as needed), please bring them with you on the day of your procedure.  We are referring you to [facility]. They will contact you about an appointment.    Please do sitz baths- these can be found at the pharmacy. It is a English as a second language teacher that is put in your toliet.  Please increase fiber or add benefiber, increase water and increase acitivity.  Use the recticare samples daily  Hemorrhoids are swollen veins in the lower rectum and anus.  Also called piles, hemorrhoids are a common problem.  Hemorrhoids may be internal (inside the rectum) or external (around the anus).  Internal Hemorrhoids  Internal hemorrhoids are often painless, but they rarely cause bleeding.  The internal veins may stretch and fall down (prolapse) through the anus to the outside of the body.  The veins may then become irritated and painful.  External Hemorrhoids  External hemorrhoids can be easily seen or felt around the anal  opening.  They are under the skin around the anus.  When the swollen veins are scratched or broken by straining, rubbing or wiping they sometimes bleed.  How Hemorrhoids Occur  Veins in the rectum and around the anus tend to swell under pressure.  Hemorrhoids can result from increased pressure in the veins of your anus or rectum.  Some sources of pressure are:  Straining to have a bowel movement because of constipation Waiting too long to have a bowel movement Coughing and sneezing often Sitting for extended periods of time, including on the toilet Diarrhea Obesity Trauma or injury to the anus Some liver diseases Stress Family history of hemorrhoids Pregnancy  Pregnant women should try to avoid becoming constipated, because they are more likely to have hemorrhoids during pregnancy.  In the last trimester of pregnancy, the enlarged uterus may press on blood vessels and causes hemorrhoids.  In addition, the strain of childbirth sometimes causes hemorrhoids after the birth.  Symptoms of Hemorrhoids  Some symptoms of hemorrhoids include: Swelling and/or a tender lump around the anus Itching, mild burning and bleeding around the anus Painful bowel movements with or without constipation Bright red blood covering the stool, on toilet paper or in the toilet bowel.   Symptoms usually go away within a few days.  Always talk to your doctor about any bleeding to make sure it is not from some other causes.  Diagnosing and Treating Hemorrhoids  Diagnosis is made by an examination by your healthcare provider.  Special test can be performed by your doctor.    Most cases of hemorrhoids can be treated with: High-fiber diet: Eat more high-fiber foods, which help prevent constipation.  Ask for more detailed fiber information on types and sources of fiber from your healthcare provider. Fluids: Drink plenty of water.  This helps soften bowel movements so they are easier to pass. Sitz baths and cold  packs: Sitting in lukewarm water two or three times a day for 15 minutes cleases the anal area and may relieve discomfort.  If the water is too hot, swelling around the anus will get worse.  Placing a cloth-covered ice pack on the anus for ten minutes four times a day can also help reduce selling.  Gently pushing a prolapsed hemorrhoid back inside after the bath or ice pack can be helpful. Medications: For mild discomfort, your healthcare provider may suggest over-the-counter pain medication or prescribe a cream or ointment for topical use.  The cream may contain witch hazel, zinc oxide or petroleum jelly.  Medicated suppositories are also a treatment option.  Always consult your doctor before applying medications or creams. Procedures and surgeries: There are also a number of procedures and surgeries to shrink or remove hemorrhoids in more serious cases.  Talk to your physician about these options.  You can often prevent hemorrhoids or keep them from becoming worse by maintaining a healthy lifestyle.  Eat a fiber-rich diet of fruits, vegetables and whole grains.  Also, drink plenty of water and exercise regularly.   2007, [employer] Doc.30

## 2022-09-18 ENCOUNTER — Emergency Department (HOSPITAL_COMMUNITY): Payer: Medicaid Other

## 2022-09-18 ENCOUNTER — Telehealth: Payer: Self-pay | Admitting: Physician Assistant

## 2022-09-18 ENCOUNTER — Other Ambulatory Visit: Payer: Self-pay

## 2022-09-18 ENCOUNTER — Emergency Department (HOSPITAL_COMMUNITY)
Admission: EM | Admit: 2022-09-18 | Discharge: 2022-09-18 | Discharge status: Against Medical Advice | Payer: Medicaid Other | Attending: Emergency Medicine | Admitting: Emergency Medicine

## 2022-09-18 DIAGNOSIS — I10 Essential (primary) hypertension: Secondary | ICD-10-CM | POA: Diagnosis not present

## 2022-09-18 DIAGNOSIS — K625 Hemorrhage of anus and rectum: Secondary | ICD-10-CM | POA: Insufficient documentation

## 2022-09-18 DIAGNOSIS — R0602 Shortness of breath: Secondary | ICD-10-CM | POA: Diagnosis not present

## 2022-09-18 DIAGNOSIS — R0902 Hypoxemia: Secondary | ICD-10-CM | POA: Diagnosis not present

## 2022-09-18 DIAGNOSIS — R42 Dizziness and giddiness: Secondary | ICD-10-CM | POA: Insufficient documentation

## 2022-09-18 DIAGNOSIS — R531 Weakness: Secondary | ICD-10-CM | POA: Insufficient documentation

## 2022-09-18 DIAGNOSIS — K51511 Left sided colitis with rectal bleeding: Secondary | ICD-10-CM | POA: Diagnosis not present

## 2022-09-18 DIAGNOSIS — Z5321 Procedure and treatment not carried out due to patient leaving prior to being seen by health care provider: Secondary | ICD-10-CM | POA: Diagnosis not present

## 2022-09-18 DIAGNOSIS — R58 Hemorrhage, not elsewhere classified: Secondary | ICD-10-CM | POA: Diagnosis not present

## 2022-09-18 LAB — CBC WITH DIFFERENTIAL/PLATELET
Abs Immature Granulocytes: 0.02 10*3/uL (ref 0.00–0.07)
Basophils Absolute: 0 10*3/uL (ref 0.0–0.1)
Basophils Relative: 0 %
Eosinophils Absolute: 0.1 10*3/uL (ref 0.0–0.5)
Eosinophils Relative: 2 %
HCT: 39.1 % (ref 39.0–52.0)
Hemoglobin: 13.4 g/dL (ref 13.0–17.0)
Immature Granulocytes: 0 %
Lymphocytes Relative: 14 %
Lymphs Abs: 1 10*3/uL (ref 0.7–4.0)
MCH: 31.7 pg (ref 26.0–34.0)
MCHC: 34.3 g/dL (ref 30.0–36.0)
MCV: 92.4 fL (ref 80.0–100.0)
Monocytes Absolute: 0.6 10*3/uL (ref 0.1–1.0)
Monocytes Relative: 9 %
Neutro Abs: 5.5 10*3/uL (ref 1.7–7.7)
Neutrophils Relative %: 75 %
Platelets: 241 10*3/uL (ref 150–400)
RBC: 4.23 MIL/uL (ref 4.22–5.81)
RDW: 12.9 % (ref 11.5–15.5)
WBC: 7.3 10*3/uL (ref 4.0–10.5)
nRBC: 0 % (ref 0.0–0.2)

## 2022-09-18 LAB — COMPREHENSIVE METABOLIC PANEL
ALT: 15 U/L (ref 0–44)
AST: 14 U/L — ABNORMAL LOW (ref 15–41)
Albumin: 3.9 g/dL (ref 3.5–5.0)
Alkaline Phosphatase: 43 U/L (ref 38–126)
Anion gap: 8 (ref 5–15)
BUN: 18 mg/dL (ref 6–20)
CO2: 21 mmol/L — ABNORMAL LOW (ref 22–32)
Calcium: 9 mg/dL (ref 8.9–10.3)
Chloride: 109 mmol/L (ref 98–111)
Creatinine, Ser: 0.77 mg/dL (ref 0.61–1.24)
GFR, Estimated: >60 mL/min (ref >60)
Glucose, Bld: 114 mg/dL — ABNORMAL HIGH (ref 70–99)
Potassium: 4.1 mmol/L (ref 3.5–5.1)
Sodium: 138 mmol/L (ref 135–145)
Total Bilirubin: 0.5 mg/dL (ref 0.3–1.2)
Total Protein: 6.2 g/dL — ABNORMAL LOW (ref 6.5–8.1)

## 2022-09-18 NOTE — Telephone Encounter (Signed)
Patient mother is calling back to inform nurse that her son is on his way to the hospital and she is unsure of what to do next since he is scheduled for procedure tomorrow. Her number is [phone].

## 2022-09-18 NOTE — ED Triage Notes (Signed)
EMS stated, he has had some bright red rectal bleeding from hemoroids.  He also had some SOB  at R/A it is 72% .  Given a Duo neb. - toook him =up to 100% 20 g in rt. forEARM

## 2022-09-18 NOTE — Telephone Encounter (Signed)
Inbound call from patients mother stating that patient is scheduled to have a colonoscopy tomorrow with Dr. Silverio Decamp and she stated patient was in last week for hemorrhoids and was bleeding and she believes he has lost a " gallon" of blood and is requesting a call back to discuss. Please advise.

## 2022-09-18 NOTE — ED Notes (Signed)
Patient decided to leave I took out his saline lock

## 2022-09-18 NOTE — Telephone Encounter (Signed)
Spoke with patient & mother, and he states that he has been experiencing heavy bright red rectal bleeding since yesterday. His depends are soaked at times with clots. He went to [facility] ED today & left, states they were only concerned about his emphysema. He's scheduled to be seen with [facility] on 11/14, and has endo colon with Dr. Silverio Decamp tomorrow and would like to know if it is okay to proceed. Last hgb at ED 13.4.

## 2022-09-18 NOTE — Telephone Encounter (Signed)
Spoke with patient's mother regarding PA recommendations. She verbalized all understanding.

## 2022-09-18 NOTE — ED Provider Triage Note (Signed)
Emergency Medicine Provider Triage Evaluation Note  Kenneth Mcdowell , a 56 y.o. male  was evaluated in triage.  Pt complains of rectal bleeding, lightheadedness, weakness, shortness of breath.  Patient does have a history of emphysema.  States he often gets short of breath but this typically resolves after he walks around and takes his breathing treatments.  States that occasionally has not resolved by this.  EMS gave albuterol and he reports his symptoms have resolved.  No shortness of breath at this time.  He states he has a history of hemorrhoids.  Does have a scheduled endoscopy and colonoscopy.  Tomorrow.  Also has a hemorrhoidectomy scheduled for 09/25/2022.  States his hemorrhoids have been bleeding a significant amount since yesterday which is causing his symptoms.  Denies fevers, chills, chest pain, dizziness.  Review of Systems  Positive: As above Negative: As above  Physical Exam  There were no vitals taken for this visit. Gen:   Awake, no distress   Resp:  Normal effort lungs tight without adventitious sounds MSK:   Moves extremities without difficulty  Other:  Rectal exam deferred in triage  Medical Decision Making  Medically screening exam initiated at 11:29 AM.  Appropriate orders placed.  Kenneth Mcdowell was informed that the remainder of the evaluation will be completed by another provider, this initial triage assessment does not replace that evaluation, and the importance of remaining in the ED until their evaluation is complete.  Basic labs, chest x ray, ekg   Kenneth Mcdowell, Kenneth Mcdowell 09/18/22 1131

## 2022-09-19 ENCOUNTER — Telehealth: Payer: Self-pay

## 2022-09-19 ENCOUNTER — Encounter: Payer: Medicaid Other | Admitting: Gastroenterology

## 2022-09-19 NOTE — Telephone Encounter (Signed)
Called patient to ask if he could come in one hour early for his procedure this afternoon. Patient was grateful to come in early and knows to stop all fluids at 11:30. He is also starting his prep an hour early.

## 2022-09-19 NOTE — Telephone Encounter (Signed)
Pt arrived at [facility] for EGD/COLON with Dr. Silverio Decamp. Pt had chewing tobacco in his mouth upon arrival. Pt was rescheduled to 09/20/22 with Dr. Fuller Plan at 2pm. Pt was given written instructions for one half of miralax prep to start the morning of procedure. Instructed him to start the miralax at 9am, be done at 11am, and to arrive at [facility] at 1pm. He was advised to remain on clear liquids for the rest of the day. Pt also instructed to remove tongue ring before egd tomorrow when pt decided to decline the EGD, and only wanted to proceed with the colonoscopy. Appt was changed to just the colon. Also, instructed pt not to have any chewing tobacco after midnight tonight at which pt stated "good luck with that". MD notified of changes to pt appt.

## 2022-09-20 ENCOUNTER — Encounter: Payer: Medicaid Other | Admitting: Gastroenterology

## 2022-09-21 NOTE — Telephone Encounter (Signed)
Patients mother called to get the colonoscopy rescheduled, provided her with the next available 10/09/22 but she declined requesting something sooner because the patient has a lot of rectal bleeding.

## 2022-09-24 NOTE — Telephone Encounter (Signed)
Spoke with patient's mother & advised that 11/28 is the soonest procedure we have available with Dr. Fuller Plan, however to call back and check in to see if there are any cancellations. Updated instructions have been sent to patient's mychart. She verbalized all understanding & did not state any further concerns.

## 2022-10-09 ENCOUNTER — Encounter: Payer: Self-pay | Admitting: Gastroenterology

## 2022-10-09 ENCOUNTER — Ambulatory Visit (AMBULATORY_SURGERY_CENTER): Payer: Medicaid Other | Admitting: Gastroenterology

## 2022-10-09 VITALS — BP 149/73 | HR 81 | Temp 98.7°F | Resp 15 | Ht 75.0 in | Wt 149.0 lb

## 2022-10-09 DIAGNOSIS — D123 Benign neoplasm of transverse colon: Secondary | ICD-10-CM

## 2022-10-09 DIAGNOSIS — D124 Benign neoplasm of descending colon: Secondary | ICD-10-CM | POA: Diagnosis not present

## 2022-10-09 DIAGNOSIS — K625 Hemorrhage of anus and rectum: Secondary | ICD-10-CM | POA: Diagnosis not present

## 2022-10-09 DIAGNOSIS — D122 Benign neoplasm of ascending colon: Secondary | ICD-10-CM | POA: Diagnosis not present

## 2022-10-09 DIAGNOSIS — D128 Benign neoplasm of rectum: Secondary | ICD-10-CM | POA: Diagnosis not present

## 2022-10-09 DIAGNOSIS — K6289 Other specified diseases of anus and rectum: Secondary | ICD-10-CM

## 2022-10-09 MED ORDER — SODIUM CHLORIDE 0.9 % IV SOLN: 500.0000 mL | Freq: Once | INTRAVENOUS | Status: DC

## 2022-10-09 NOTE — Progress Notes (Signed)
Vss nad trans to pacu °

## 2022-10-09 NOTE — Patient Instructions (Addendum)
- Repeat colonoscopy after studies are complete for surveillance based on pathology results with Dr. Silverio Decamp. - Patient has a contact number available for emergencies. The signs and symptoms of potential delayed complications were discussed with the patient. Return to normal activities tomorrow. Written discharge instructions were provided to the patient. - Resume previous diet. - Continue present medications. - Await pathology results. - No aspirin, ibuprofen, naproxen, or other non-steroidal anti-inflammatory drugs for 2 weeks after polyp removal. - Follow up with Dr. Silverio Decamp and Dr. Johney Maine as planned. -Handout on hemorrhoids, polyps, and diverticulosis provided.  YOU HAD AN ENDOSCOPIC PROCEDURE TODAY AT [facility]:   Refer to the procedure report that was given to you for any specific questions about what was found during the examination.  If the procedure report does not answer your questions, please call your gastroenterologist to clarify.  If you requested that your care partner not be given the details of your procedure findings, then the procedure report has been included in a sealed envelope for you to review at your convenience later.  YOU SHOULD EXPECT: Some feelings of bloating in the abdomen. Passage of more gas than usual.  Walking can help get rid of the air that was put into your GI tract during the procedure and reduce the bloating. If you had a lower endoscopy (such as a colonoscopy or flexible sigmoidoscopy) you may notice spotting of blood in your stool or on the toilet paper. If you underwent a bowel prep for your procedure, you may not have a normal bowel movement for a few days.  Please Note:  You might notice some irritation and congestion in your nose or some drainage.  This is from the oxygen used during your procedure.  There is no need for concern and it should clear up in a day or so.  SYMPTOMS TO REPORT IMMEDIATELY:  Following lower endoscopy  (colonoscopy or flexible sigmoidoscopy):  Excessive amounts of blood in the stool  Significant tenderness or worsening of abdominal pains  Swelling of the abdomen that is new, acute  Fever of 100F or higher  For urgent or emergent issues, a gastroenterologist can be reached at any hour by calling [phone]. Do not use MyChart messaging for urgent concerns.    DIET:  We do recommend a small meal at first, but then you may proceed to your regular diet.  Drink plenty of fluids but you should avoid alcoholic beverages for 24 hours.  ACTIVITY:  You should plan to take it easy for the rest of today and you should NOT DRIVE or use heavy machinery until tomorrow (because of the sedation medicines used during the test).    FOLLOW UP: Our staff will call the number listed on your records the next business day following your procedure.  We will call around 7:15- 8:00 am to check on you and address any questions or concerns that you may have regarding the information given to you following your procedure. If we do not reach you, we will leave a message.     If any biopsies were taken you will be contacted by phone or by letter within the next 1-3 weeks.  Please call us at [phone] if you have not heard about the biopsies in 3 weeks.    SIGNATURES/CONFIDENTIALITY: You and/or your care partner have signed paperwork which will be entered into your electronic medical record.  These signatures attest to the fact that that the information above on your After Visit  Summary has been reviewed and is understood.  Full responsibility of the confidentiality of this discharge information lies with you and/or your care-partner.

## 2022-10-09 NOTE — Op Note (Signed)
[place] Patient Name: Kenneth Mcdowell Procedure Date: 10/09/2022 11:20 AM MRN: [id number] Endoscopist: Ladene Artist , MD, [id number] Age: 56 Referring MD:  Date of Birth: 07/14/1966 Gender: Male Account #: [id number] Procedure:                Colonoscopy Indications:              Hematochezia, Rectal mass Medicines:                Monitored Anesthesia Care Procedure:                Pre-Anesthesia Assessment:                           - Prior to the procedure, a History and Physical                            was performed, and patient medications and                            allergies were reviewed. The patient's tolerance of                            previous anesthesia was also reviewed. The risks                            and benefits of the procedure and the sedation                            options and risks were discussed with the patient.                            All questions were answered, and informed consent                            was obtained. Prior Anticoagulants: The patient has                            taken no anticoagulant or antiplatelet agents. ASA                            Grade Assessment: III - A patient with severe                            systemic disease. After reviewing the risks and                            benefits, the patient was deemed in satisfactory                            condition to undergo the procedure.                           After obtaining informed consent, the colonoscope  was passed under direct vision. Throughout the                            procedure, the patient's blood pressure, pulse, and                            oxygen saturations were monitored continuously. The                            CF [id number] #[id number] was introduced through the anus                            and advanced to the the cecum, identified by                            appendiceal orifice and  ileocecal valve. The                            ileocecal valve, appendiceal orifice, and rectum                            were photographed. The quality of the bowel                            preparation was adequate after extensive lavage,                            suction. The colonoscopy was performed without                            difficulty. The patient tolerated the procedure                            well. Scope In: 11:33:34 AM Scope Out: 11:58:28 AM Scope Withdrawal Time: 0 hours 19 minutes 18 seconds  Total Procedure Duration: 0 hours 24 minutes 54 seconds  Findings:                 The digital rectal exam revealed a 3 cm (diameter)                            firm and smooth rectal mass that was prolapsing.                           An 18 mm polyp was found in the ascending colon.                            The polyp was semi-pedunculated. The polyp was                            removed with a hot snare. Resection and retrieval                            were complete.  A 10 mm polyp was found in the transverse colon.                            The polyp was semi-pedunculated. The polyp was                            removed with a hot snare. Resection and retrieval                            were complete.                           Two sessile polyps were found in the descending                            colon and transverse colon. The polyps were 7 to 8                            mm in size. These polyps were removed with a cold                            snare. Resection and retrieval were complete.                           Multiple small-mouthed diverticula were found in                            the left colon.                           A polypoid non-obstructing medium-sized mass was                            found in the distal rectum. The mass was partially                            circumferential (involving one-third of the lumen                             circumference). The mass measured three cm in                            length. In addition, its diameter measured two mm.                            No bleeding was present. This was biopsied with a                            cold forceps for histology.                           Internal hemorrhoids were found during  retroflexion. The hemorrhoids were moderate and                            Grade II (internal hemorrhoids that prolapse but                            reduce spontaneously).                           The exam was otherwise without abnormality on                            direct and retroflexion views. Complications:            No immediate complications. Estimated blood loss:                            None. Estimated Blood Loss:     Estimated blood loss: none. Impression:               - Rectal mass.                           - One 18 mm polyp in the ascending colon, removed                            with a hot snare. Resected and retrieved.                           - One 10 mm polyp in the transverse colon, removed                            with a hot snare. Resected and retrieved.                           - Two 7 to 8 mm polyps in the descending colon and                            in the transverse colon, removed with a cold snare.                            Resected and retrieved.                           - Mild diverticulosis in the left colon.                           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                           - Internal hemorrhoids.                           - The examination was otherwise normal on direct  and retroflexion views. Recommendation:           - Repeat colonoscopy after studies are complete for                            surveillance based on pathology results with Dr.                            Silverio Decamp.                           - Patient  has a contact number available for                            emergencies. The signs and symptoms of potential                            delayed complications were discussed with the                            patient. Return to normal activities tomorrow.                            Written discharge instructions were provided to the                            patient.                           - Resume previous diet.                           - Continue present medications.                           - Await pathology results.                           - No aspirin, ibuprofen, naproxen, or other                            non-steroidal anti-inflammatory drugs for 2 weeks                            after polyp removal.                           - Follow up with Dr. Silverio Decamp and Dr. Johney Maine as                            planned. Ladene Artist, MD 10/09/2022 12:07:06 PM This report has been signed electronically.

## 2022-10-09 NOTE — Progress Notes (Signed)
See 11/22/2021 H&P, no changes.  °

## 2022-10-09 NOTE — Progress Notes (Signed)
Called to room to assist during endoscopic procedure.  Patient ID and intended procedure confirmed with present staff. Received instructions for my participation in the procedure from the performing physician.  

## 2022-10-10 ENCOUNTER — Telehealth: Payer: Self-pay

## 2022-10-10 ENCOUNTER — Other Ambulatory Visit: Payer: Self-pay | Admitting: Critical Care Medicine

## 2022-10-10 NOTE — Telephone Encounter (Signed)
No answer, unable to leave a message, B.Nigel Wessman RN. 

## 2022-10-15 ENCOUNTER — Encounter (HOSPITAL_COMMUNITY): Payer: Self-pay

## 2022-10-15 ENCOUNTER — Inpatient Hospital Stay (HOSPITAL_COMMUNITY): Admission: RE | Admit: 2022-10-15 | Payer: Medicaid Other | Source: Ambulatory Visit

## 2022-10-15 ENCOUNTER — Other Ambulatory Visit: Payer: Self-pay | Admitting: Surgery

## 2022-10-15 ENCOUNTER — Other Ambulatory Visit: Payer: Self-pay

## 2022-10-15 ENCOUNTER — Inpatient Hospital Stay (HOSPITAL_COMMUNITY)
Admission: EM | Admit: 2022-10-15 | Discharge: 2022-10-17 | DRG: 348 | Disposition: A | Discharge status: Home / Self Care | Payer: Medicaid Other | Attending: Surgery | Admitting: Surgery

## 2022-10-15 DIAGNOSIS — Z634 Disappearance and death of family member: Secondary | ICD-10-CM | POA: Diagnosis not present

## 2022-10-15 DIAGNOSIS — K625 Hemorrhage of anus and rectum: Secondary | ICD-10-CM | POA: Diagnosis present

## 2022-10-15 DIAGNOSIS — D500 Iron deficiency anemia secondary to blood loss (chronic): Secondary | ICD-10-CM | POA: Diagnosis not present

## 2022-10-15 DIAGNOSIS — J439 Emphysema, unspecified: Secondary | ICD-10-CM | POA: Diagnosis not present

## 2022-10-15 DIAGNOSIS — J4489 Other specified chronic obstructive pulmonary disease: Secondary | ICD-10-CM | POA: Diagnosis present

## 2022-10-15 DIAGNOSIS — K219 Gastro-esophageal reflux disease without esophagitis: Secondary | ICD-10-CM | POA: Diagnosis not present

## 2022-10-15 DIAGNOSIS — Z87891 Personal history of nicotine dependence: Secondary | ICD-10-CM

## 2022-10-15 DIAGNOSIS — M25511 Pain in right shoulder: Secondary | ICD-10-CM | POA: Diagnosis present

## 2022-10-15 DIAGNOSIS — Z86010 Personal history of colonic polyps: Secondary | ICD-10-CM | POA: Diagnosis not present

## 2022-10-15 DIAGNOSIS — I10 Essential (primary) hypertension: Secondary | ICD-10-CM | POA: Diagnosis present

## 2022-10-15 DIAGNOSIS — D649 Anemia, unspecified: Secondary | ICD-10-CM | POA: Diagnosis not present

## 2022-10-15 DIAGNOSIS — G8929 Other chronic pain: Secondary | ICD-10-CM | POA: Diagnosis present

## 2022-10-15 DIAGNOSIS — K642 Third degree hemorrhoids: Secondary | ICD-10-CM | POA: Insufficient documentation

## 2022-10-15 DIAGNOSIS — R9431 Abnormal electrocardiogram [ECG] [EKG]: Secondary | ICD-10-CM | POA: Diagnosis not present

## 2022-10-15 DIAGNOSIS — D128 Benign neoplasm of rectum: Principal | ICD-10-CM | POA: Diagnosis present

## 2022-10-15 DIAGNOSIS — K622 Anal prolapse: Secondary | ICD-10-CM

## 2022-10-15 DIAGNOSIS — F1491 Cocaine use, unspecified, in remission: Secondary | ICD-10-CM

## 2022-10-15 DIAGNOSIS — K644 Residual hemorrhoidal skin tags: Secondary | ICD-10-CM | POA: Diagnosis present

## 2022-10-15 DIAGNOSIS — Z833 Family history of diabetes mellitus: Secondary | ICD-10-CM | POA: Diagnosis not present

## 2022-10-15 DIAGNOSIS — F909 Attention-deficit hyperactivity disorder, unspecified type: Secondary | ICD-10-CM | POA: Diagnosis not present

## 2022-10-15 DIAGNOSIS — Z800 Family history of malignant neoplasm of digestive organs: Secondary | ICD-10-CM

## 2022-10-15 DIAGNOSIS — K648 Other hemorrhoids: Secondary | ICD-10-CM | POA: Diagnosis present

## 2022-10-15 DIAGNOSIS — K922 Gastrointestinal hemorrhage, unspecified: Secondary | ICD-10-CM | POA: Diagnosis present

## 2022-10-15 DIAGNOSIS — Z825 Family history of asthma and other chronic lower respiratory diseases: Secondary | ICD-10-CM

## 2022-10-15 DIAGNOSIS — I4891 Unspecified atrial fibrillation: Secondary | ICD-10-CM | POA: Diagnosis not present

## 2022-10-15 DIAGNOSIS — F4323 Adjustment disorder with mixed anxiety and depressed mood: Secondary | ICD-10-CM | POA: Diagnosis present

## 2022-10-15 DIAGNOSIS — K6289 Other specified diseases of anus and rectum: Secondary | ICD-10-CM

## 2022-10-15 DIAGNOSIS — F319 Bipolar disorder, unspecified: Secondary | ICD-10-CM | POA: Diagnosis not present

## 2022-10-15 DIAGNOSIS — Z87828 Personal history of other (healed) physical injury and trauma: Secondary | ICD-10-CM

## 2022-10-15 DIAGNOSIS — Z720 Tobacco use: Secondary | ICD-10-CM | POA: Diagnosis present

## 2022-10-15 DIAGNOSIS — Z803 Family history of malignant neoplasm of breast: Secondary | ICD-10-CM | POA: Diagnosis not present

## 2022-10-15 DIAGNOSIS — K645 Perianal venous thrombosis: Secondary | ICD-10-CM | POA: Diagnosis not present

## 2022-10-15 HISTORY — DX: Cocaine use, unspecified, in remission: F14.91

## 2022-10-15 LAB — BASIC METABOLIC PANEL
Anion gap: 8 (ref 5–15)
BUN: 10 mg/dL (ref 6–20)
CO2: 23 mmol/L (ref 22–32)
Calcium: 8.6 mg/dL — ABNORMAL LOW (ref 8.9–10.3)
Chloride: 105 mmol/L (ref 98–111)
Creatinine, Ser: 0.75 mg/dL (ref 0.61–1.24)
GFR, Estimated: >60 mL/min (ref >60)
Glucose, Bld: 104 mg/dL — ABNORMAL HIGH (ref 70–99)
Potassium: 3.8 mmol/L (ref 3.5–5.1)
Sodium: 136 mmol/L (ref 135–145)

## 2022-10-15 LAB — CBC WITH DIFFERENTIAL/PLATELET
Abs Immature Granulocytes: 0.02 10*3/uL (ref 0.00–0.07)
Basophils Absolute: 0 10*3/uL (ref 0.0–0.1)
Basophils Relative: 0 %
Eosinophils Absolute: 0.1 10*3/uL (ref 0.0–0.5)
Eosinophils Relative: 1 %
HCT: 23.6 % — ABNORMAL LOW (ref 39.0–52.0)
Hemoglobin: 7.5 g/dL — ABNORMAL LOW (ref 13.0–17.0)
Immature Granulocytes: 0 %
Lymphocytes Relative: 17 %
Lymphs Abs: 1.3 10*3/uL (ref 0.7–4.0)
MCH: 28.8 pg (ref 26.0–34.0)
MCHC: 31.8 g/dL (ref 30.0–36.0)
MCV: 90.8 fL (ref 80.0–100.0)
Monocytes Absolute: 0.7 10*3/uL (ref 0.1–1.0)
Monocytes Relative: 10 %
Neutro Abs: 5.1 10*3/uL (ref 1.7–7.7)
Neutrophils Relative %: 72 %
Platelets: 300 10*3/uL (ref 150–400)
RBC: 2.6 MIL/uL — ABNORMAL LOW (ref 4.22–5.81)
RDW: 12.6 % (ref 11.5–15.5)
WBC: 7.2 10*3/uL (ref 4.0–10.5)
nRBC: 0 % (ref 0.0–0.2)

## 2022-10-15 MED ORDER — ENSURE PRE-SURGERY PO LIQD: 592.0000 mL | Freq: Once | ORAL | Status: DC

## 2022-10-15 MED ORDER — SIMETHICONE 80 MG PO CHEW: 40.0000 mg | CHEWABLE_TABLET | Freq: Four times a day (QID) | ORAL | Status: DC | PRN

## 2022-10-15 MED ORDER — MAGIC MOUTHWASH: 15.0000 mL | Freq: Four times a day (QID) | ORAL | Status: DC | PRN

## 2022-10-15 MED ORDER — DIPHENHYDRAMINE HCL 50 MG/ML IJ SOLN: 12.5000 mg | Freq: Four times a day (QID) | INTRAMUSCULAR | Status: DC | PRN

## 2022-10-15 MED ORDER — BUPIVACAINE LIPOSOME 1.3 % IJ SUSP
20.0000 mL | Freq: Once | INTRAMUSCULAR | Status: DC
  Filled 2022-10-15: qty 20

## 2022-10-15 MED ORDER — PROCHLORPERAZINE EDISYLATE 10 MG/2ML IJ SOLN: 5.0000 mg | Freq: Four times a day (QID) | INTRAMUSCULAR | Status: DC | PRN

## 2022-10-15 MED ORDER — LACTATED RINGERS IV BOLUS: 1000.0000 mL | Freq: Three times a day (TID) | INTRAVENOUS | Status: DC | PRN

## 2022-10-15 MED ORDER — MOMETASONE FURO-FORMOTEROL FUM 200-5 MCG/ACT IN AERO
2.0000 | INHALATION_SPRAY | Freq: Two times a day (BID) | RESPIRATORY_TRACT | Status: DC
  Administered 2022-10-16 – 2022-10-17 (×2): 2 via RESPIRATORY_TRACT
  Filled 2022-10-15: qty 8.8

## 2022-10-15 MED ORDER — NEOMYCIN SULFATE 500 MG PO TABS
1000.0000 mg | ORAL_TABLET | ORAL | Status: AC
  Administered 2022-10-16 (×3): 1000 mg via ORAL
  Filled 2022-10-15 (×3): qty 2

## 2022-10-15 MED ORDER — NICOTINE 14 MG/24HR TD PT24
14.0000 mg | MEDICATED_PATCH | Freq: Every day | TRANSDERMAL | Status: DC
  Filled 2022-10-15 (×2): qty 1

## 2022-10-15 MED ORDER — ZINC OXIDE 40 % EX OINT: TOPICAL_OINTMENT | Freq: Two times a day (BID) | CUTANEOUS | Status: DC

## 2022-10-15 MED ORDER — HYDROMORPHONE HCL 1 MG/ML IJ SOLN: 0.5000 mg | INTRAMUSCULAR | Status: DC | PRN

## 2022-10-15 MED ORDER — CHLORHEXIDINE GLUCONATE CLOTH 2 % EX PADS: 6.0000 | MEDICATED_PAD | Freq: Once | CUTANEOUS | Status: DC

## 2022-10-15 MED ORDER — ACETAMINOPHEN 500 MG PO TABS
1000.0000 mg | ORAL_TABLET | ORAL | Status: AC
  Administered 2022-10-16: 1000 mg via ORAL
  Filled 2022-10-15: qty 2

## 2022-10-15 MED ORDER — GABAPENTIN 300 MG PO CAPS
300.0000 mg | ORAL_CAPSULE | ORAL | Status: AC
  Administered 2022-10-16: 300 mg via ORAL
  Filled 2022-10-15: qty 1

## 2022-10-15 MED ORDER — LORAZEPAM 2 MG/ML IJ SOLN
0.5000 mg | Freq: Three times a day (TID) | INTRAMUSCULAR | Status: DC | PRN
  Administered 2022-10-16 – 2022-10-17 (×2): 1 mg via INTRAVENOUS
  Filled 2022-10-15 (×2): qty 1

## 2022-10-15 MED ORDER — ONDANSETRON HCL 4 MG/2ML IJ SOLN
4.0000 mg | Freq: Four times a day (QID) | INTRAMUSCULAR | Status: DC | PRN
  Administered 2022-10-16: 4 mg via INTRAVENOUS
  Filled 2022-10-15: qty 2

## 2022-10-15 MED ORDER — ALBUTEROL SULFATE (2.5 MG/3ML) 0.083% IN NEBU: 3.0000 mL | INHALATION_SOLUTION | Freq: Four times a day (QID) | RESPIRATORY_TRACT | Status: DC | PRN

## 2022-10-15 MED ORDER — LACTATED RINGERS IV BOLUS
1000.0000 mL | Freq: Once | INTRAVENOUS | Status: AC
  Administered 2022-10-15: 1000 mL via INTRAVENOUS

## 2022-10-15 MED ORDER — ZINC OXIDE 40 % EX OINT
TOPICAL_OINTMENT | Freq: Two times a day (BID) | CUTANEOUS | Status: DC
  Filled 2022-10-15: qty 57

## 2022-10-15 MED ORDER — METRONIDAZOLE 500 MG PO TABS
1000.0000 mg | ORAL_TABLET | ORAL | Status: AC
  Administered 2022-10-16 (×3): 1000 mg via ORAL
  Filled 2022-10-15 (×3): qty 2

## 2022-10-15 MED ORDER — CALCIUM POLYCARBOPHIL 625 MG PO TABS
625.0000 mg | ORAL_TABLET | Freq: Two times a day (BID) | ORAL | Status: DC
  Administered 2022-10-16 – 2022-10-17 (×4): 625 mg via ORAL
  Filled 2022-10-15 (×4): qty 1

## 2022-10-15 MED ORDER — HYDROCORT-PRAMOXINE (PERIANAL) 2.5-1 % EX CREA: TOPICAL_CREAM | Freq: Four times a day (QID) | CUTANEOUS | Status: DC | PRN

## 2022-10-15 MED ORDER — SODIUM CHLORIDE 0.9% IV SOLUTION: Freq: Once | INTRAVENOUS | Status: DC

## 2022-10-15 MED ORDER — POLYETHYLENE GLYCOL 3350 17 GM/SCOOP PO POWD
1.0000 | Freq: Once | ORAL | Status: DC
  Filled 2022-10-15: qty 255

## 2022-10-15 MED ORDER — LACTATED RINGERS IV SOLN: INTRAVENOUS | Status: DC

## 2022-10-15 MED ORDER — LIP MEDEX EX OINT
TOPICAL_OINTMENT | Freq: Two times a day (BID) | CUTANEOUS | Status: DC
  Administered 2022-10-16: 75 via TOPICAL
  Filled 2022-10-15 (×3): qty 7

## 2022-10-15 MED ORDER — BISACODYL 5 MG PO TBEC
20.0000 mg | DELAYED_RELEASE_TABLET | Freq: Once | ORAL | Status: DC
  Filled 2022-10-15: qty 4

## 2022-10-15 MED ORDER — METOPROLOL TARTRATE 5 MG/5ML IV SOLN: 5.0000 mg | Freq: Four times a day (QID) | INTRAVENOUS | Status: DC | PRN

## 2022-10-15 MED ORDER — BISACODYL 10 MG RE SUPP: 10.0000 mg | Freq: Two times a day (BID) | RECTAL | Status: DC | PRN

## 2022-10-15 MED ORDER — ONDANSETRON 4 MG PO TBDP: 4.0000 mg | ORAL_TABLET | Freq: Four times a day (QID) | ORAL | Status: DC | PRN

## 2022-10-15 MED ORDER — WITCH HAZEL-GLYCERIN EX PADS: MEDICATED_PAD | CUTANEOUS | Status: DC | PRN

## 2022-10-15 MED ORDER — METHOCARBAMOL 1000 MG/10ML IJ SOLN: 1000.0000 mg | Freq: Four times a day (QID) | INTRAVENOUS | Status: DC | PRN

## 2022-10-15 MED ORDER — SODIUM CHLORIDE 0.9 % IV SOLN
2.0000 g | INTRAVENOUS | Status: AC
  Administered 2022-10-16: 2 g via INTRAVENOUS
  Filled 2022-10-15 (×2): qty 2

## 2022-10-15 MED ORDER — METHOCARBAMOL 500 MG PO TABS
1000.0000 mg | ORAL_TABLET | Freq: Four times a day (QID) | ORAL | Status: DC | PRN
  Administered 2022-10-17: 1000 mg via ORAL
  Filled 2022-10-15: qty 2

## 2022-10-15 MED ORDER — ALVIMOPAN 12 MG PO CAPS
12.0000 mg | ORAL_CAPSULE | ORAL | Status: AC
  Administered 2022-10-16: 12 mg via ORAL
  Filled 2022-10-15 (×2): qty 1

## 2022-10-15 MED ORDER — ENOXAPARIN SODIUM 40 MG/0.4ML IJ SOSY
40.0000 mg | PREFILLED_SYRINGE | Freq: Once | INTRAMUSCULAR | Status: DC
  Filled 2022-10-15: qty 0.4

## 2022-10-15 MED ORDER — PROCHLORPERAZINE MALEATE 10 MG PO TABS: 10.0000 mg | ORAL_TABLET | Freq: Four times a day (QID) | ORAL | Status: DC | PRN

## 2022-10-15 MED ORDER — ENSURE PRE-SURGERY PO LIQD
296.0000 mL | Freq: Once | ORAL | Status: AC
  Administered 2022-10-16: 237 mL via ORAL
  Filled 2022-10-15: qty 296

## 2022-10-15 MED ORDER — DIPHENHYDRAMINE HCL 12.5 MG/5ML PO ELIX: 12.5000 mg | ORAL_SOLUTION | Freq: Four times a day (QID) | ORAL | Status: DC | PRN

## 2022-10-15 MED ORDER — SODIUM CHLORIDE 0.9 % IV SOLN
125.0000 mg | Freq: Once | INTRAVENOUS | Status: AC
  Administered 2022-10-15: 125 mg via INTRAVENOUS
  Filled 2022-10-15: qty 10

## 2022-10-15 NOTE — ED Provider Triage Note (Signed)
Emergency Medicine Provider Triage Evaluation Note  Kenneth Mcdowell , a 56 y.o. male  was evaluated in triage.  Pt complains of rectal bleeding.  Seen by Dr. Johney Maine with surgery today.  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  Has had increase in his bleeding wearing diapers, having clots.  Some mild lightheadedness.  Tells me he was supposed to be a direct admit however due to his bleeding he was sent to the emergency department.  He is post have surgery tomorrow.  Review of Systems  Positive: Rectal bleeding Negative:   Physical Exam  BP (!) 121/99 (BP Location: Left Arm)   Pulse 90   Temp 98.3 F (36.8 C) (Oral)   Resp 18   SpO2 100%  Gen:   Awake, no distress   Resp:  Normal effort  MSK:   Moves extremities without difficulty  Other:    Medical Decision Making  Medically screening exam initiated at 4:23 PM.  Appropriate orders placed.  [name] was informed that the remainder of the evaluation will be completed by another provider, this initial triage assessment does not replace that evaluation, and the importance of remaining in the ED until their evaluation is complete.  Rectal bleeding   Samentha Perham A, PA-C 10/15/22 1624

## 2022-10-15 NOTE — ED Triage Notes (Signed)
Pt reports he is supposed to have surgery on hemorrhoids tomorrow morning. Pt reports he was supposed to be admitted tonight but was told to come through the ER>

## 2022-10-15 NOTE — ED Provider Notes (Signed)
[facility] DEPT Provider Note   CSN: [id number] Arrival date & time: 10/15/22  1539     History  Chief Complaint  Patient presents with   Rectal Bleeding    Kenneth Mcdowell is a 56 y.o. male.  56 year old male with a history of hemorrhoids presents emergency department with rectal bleeding.  Patient has had longstanding history of hemorrhoidal bleeding that is worsened over the past month.  Denies any new dizziness, chest pain, shortness of breath.  Is not on blood thinners antiplatelets.  Is followed by Dr. Annie Main gross from general surgery who was planning on doing a rectal mass excision and referred him to the emergency department for additional evaluation. Denies abdominal pain, etoh use, or NSAID use.        Home Medications Prior to Admission medications   Medication Sig Start Date End Date Taking? Authorizing Provider  acetaminophen (TYLENOL) 500 MG tablet Take 500-1,000 mg by mouth every 6 (six) hours as needed for mild pain or moderate pain.   Yes [provider]  albuterol (VENTOLIN HFA) 108 (90 Base) MCG/ACT inhaler INHALE 2 PUFFS BY MOUTH EVERY 6 HOURS AS NEEDED FOR WHEEZE OR SHORTNESS OF BREATH Patient taking differently: Inhale 2 puffs into the lungs every 6 (six) hours as needed for shortness of breath. 10/10/22  Yes [name], MD  naloxone [facility]) nasal spray 4 mg/0.1 mL Spray 2 sprays as needed for opiate overdose Patient taking differently: Place 1 spray into the nose daily as needed (for overdose). 05/21/22  Yes [name], MD  fluticasone-salmeterol (ADVAIR DISKUS) 250-50 MCG/ACT AEPB Inhale 1 puff into the lungs in the morning and at bedtime. Patient not taking: Reported on 10/15/2022 05/21/22 05/21/23  [name], MD  methocarbamol (ROBAXIN) 500 MG tablet Take 1 tablet (500 mg total) by mouth every 6 (six) hours as needed for muscle spasms. Patient not taking: Reported on 10/09/2022 08/26/22   [name], MD      Allergies    Patient has no known allergies.    Review of Systems   Review of Systems  Physical Exam Updated Vital Signs BP 122/72   Pulse 91   Temp 98.6 F (37 C) (Oral)   Resp 18   SpO2 98%  Physical Exam Vitals and nursing note reviewed.  Constitutional:      General: He is not in acute distress.    Appearance: He is well-developed.  HENT:     Head: Normocephalic and atraumatic.     Right Ear: External ear normal.     Left Ear: External ear normal.     Nose: Nose normal.  Eyes:     Extraocular Movements: Extraocular movements intact.     Conjunctiva/sclera: Conjunctivae normal.     Pupils: Pupils are equal, round, and reactive to light.  Cardiovascular:     Rate and Rhythm: Normal rate and regular rhythm.  Pulmonary:     Effort: Pulmonary effort is normal. No respiratory distress.  Abdominal:     General: There is no distension.     Palpations: Abdomen is soft. There is no mass.     Tenderness: There is no abdominal tenderness. There is no guarding.  Genitourinary:    Comments: Chaperoned by patient's tech willie. Bleeding external mass noted on rectum with slow oozing of blood but no arterial bleeding.  Musculoskeletal:     Cervical back: Normal range of motion and neck supple.     Right lower leg:  No edema.     Left lower leg: No edema.  Skin:    General: Skin is warm and dry.  Neurological:     Mental Status: He is alert. Mental status is at baseline.  Psychiatric:        Mood and Affect: Mood normal.        Behavior: Behavior normal.     ED Results / Procedures / Treatments   Labs (all labs ordered are listed, but only abnormal results are displayed) Labs Reviewed  CBC WITH DIFFERENTIAL/PLATELET - Abnormal; Notable for the following components:      Result Value   RBC 2.60 (*)    Hemoglobin 7.5 (*)    HCT 23.6 (*)    All other components within normal limits  BASIC METABOLIC PANEL - Abnormal; Notable for the following  components:   Glucose, Bld 104 (*)    Calcium 8.6 (*)    All other components within normal limits  PROTIME-INR  HEMOGLOBIN  RAPID URINE DRUG SCREEN, HOSP PERFORMED  TYPE AND SCREEN    EKG EKG Interpretation  Date/Time:  Monday October 15 2022 17:07:18 EST Ventricular Rate:  91 PR Interval:  137 QRS Duration: 68 QT Interval:  364 QTC Calculation: 448 R Axis:   65 Text Interpretation: Sinus rhythm Left ventricular hypertrophy No significant change since prior 11/23 Confirmed by Aletta Edouard [phone]) on 10/15/2022 5:11:04 PM  Radiology No results found.  Procedures Procedures    Medications Ordered in ED Medications  enoxaparin (LOVENOX) injection 40 mg (0 mg Subcutaneous Hold 10/15/22 2121)  Chlorhexidine Gluconate Cloth 2 % PADS 6 each (has no administration in time range)    And  Chlorhexidine Gluconate Cloth 2 % PADS 6 each (0 each Topical Hold 10/15/22 2121)  feeding supplement (ENSURE PRE-SURGERY) liquid 592 mL (0 mLs Oral Hold 10/15/22 2125)  feeding supplement (ENSURE PRE-SURGERY) liquid 296 mL (has no administration in time range)  cefoTEtan (CEFOTAN) 2 g in sodium chloride 0.9 % 100 mL IVPB (has no administration in time range)  polyethylene glycol powder (GLYCOLAX/MIRALAX) container 255 g (255 g Oral Patient Refused/Not Given 10/15/22 2205)  neomycin (MYCIFRADIN) tablet 1,000 mg (has no administration in time range)    And  metroNIDAZOLE (FLAGYL) tablet 1,000 mg (has no administration in time range)  acetaminophen (TYLENOL) tablet 1,000 mg (has no administration in time range)  bupivacaine liposome (EXPAREL) 1.3 % injection 266 mg (has no administration in time range)  gabapentin (NEURONTIN) capsule 300 mg (has no administration in time range)  alvimopan (ENTEREG) capsule 12 mg (has no administration in time range)  bisacodyl (DULCOLAX) EC tablet 20 mg (20 mg Oral Patient Refused/Not Given 10/15/22 2204)  lactated ringers infusion (has no administration in time  range)  HYDROmorphone (DILAUDID) injection 0.5-2 mg (has no administration in time range)  diphenhydrAMINE (BENADRYL) 12.5 MG/5ML elixir 12.5 mg (has no administration in time range)    Or  diphenhydrAMINE (BENADRYL) injection 12.5 mg (has no administration in time range)  ondansetron (ZOFRAN-ODT) disintegrating tablet 4 mg (has no administration in time range)    Or  ondansetron (ZOFRAN) injection 4 mg (has no administration in time range)  prochlorperazine (COMPAZINE) tablet 10 mg (has no administration in time range)    Or  prochlorperazine (COMPAZINE) injection 5-10 mg (has no administration in time range)  simethicone (MYLICON) chewable tablet 40 mg (has no administration in time range)  metoprolol tartrate (LOPRESSOR) injection 5 mg (has no administration in time range)  lactated ringers bolus 1,000  mL (has no administration in time range)  methocarbamol (ROBAXIN) 1,000 mg in dextrose 5 % 100 mL IVPB (has no administration in time range)  methocarbamol (ROBAXIN) tablet 1,000 mg (has no administration in time range)  lip balm (CARMEX) ointment ( Topical Given 10/15/22 2212)  magic mouthwash (has no administration in time range)  witch hazel-glycerin (TUCKS) pad (has no administration in time range)  hydrocortisone-pramoxine (ANALPRAM-HC) 2.5-1 % rectal cream (has no administration in time range)  polycarbophil (FIBERCON) tablet 625 mg (has no administration in time range)  LORazepam (ATIVAN) injection 0.5-1 mg (has no administration in time range)  liver oil-zinc oxide (DESITIN) 40 % ointment (has no administration in time range)  0.9 %  sodium chloride infusion (Manually program via Guardrails IV Fluids) (0 mLs Intravenous Hold 10/15/22 2119)  albuterol (PROVENTIL) (2.5 MG/3ML) 0.083% nebulizer solution 3 mL (has no administration in time range)  mometasone-formoterol (DULERA) 200-5 MCG/ACT inhaler 2 puff (0 puffs Inhalation Hold 10/15/22 2125)  nicotine (NICODERM CQ - dosed in mg/24  hours) patch 14 mg (14 mg Transdermal Patient Refused/Not Given 10/15/22 2204)  lactated ringers bolus 1,000 mL (1,000 mLs Intravenous New Bag/Given 10/15/22 1849)  ferric gluconate (FERRLECIT) 125 mg in sodium chloride 0.9 % 100 mL IVPB (125 mg Intravenous New Bag/Given 10/15/22 2204)    ED Course/ Medical Decision Making/ A&P Clinical Course as of 10/15/22 2336  Mon Oct 15, 2022  1831 Spoke with Dr Brantley Stage from gen surg [RP]    Clinical Course User Index [RP] Fransico Meadow, MD                           Medical Decision Making Risk Decision regarding hospitalization.   MUHAMAD SERANO is a 56 y.o. male with comorbidities that complicate the patient evaluation including hemorrhoids and rectal mass who presents with chief complaint of rectal bleeding.  Initial Ddx:  Bleeding hemorrhoid, malignancy, upper GI bleed  MDM:  Feel the patient likely has a bleeding mass may be a hemorrhoid.  Does not appear to have melena or symptoms of an upper GI bleed.  Currently is hemodynamically stable not having significant symptoms of anemia.  Not on blood thinners.  Patient is to have mass excised by general surgery.  Plan:  Labs General surgery consult  ED Summary/Re-evaluation:  Labs returned and patient has significant drop in his hemoglobin.  Did remain stable in the emergency department did not require transfusion.  Discussed with general surgery who will admit the patient.  This patient presents to the ED for concern of complaints listed in HPI, this involves an extensive number of treatment options, and is a complaint that carries with it a high risk of complications and morbidity. Disposition including potential need for admission considered.   Dispo: Admit to Floor  Records reviewed Outpatient Clinic Notes The following labs were independently interpreted: CBC and show acute anemia I personally reviewed and interpreted cardiac monitoring: normal sinus rhythm  I personally  reviewed and interpreted the pt's EKG: see above for interpretation  I have reviewed the patients home medications and made adjustments as needed Consults: General Surgery  Final Clinical Impression(s) / ED Diagnoses Final diagnoses:  Rectal bleeding  Rectal mass    Rx / DC Orders ED Discharge Orders     None         Fransico Meadow, MD 10/15/22 2336
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
## 2022-10-16 ENCOUNTER — Inpatient Hospital Stay (HOSPITAL_COMMUNITY): Payer: Medicaid Other | Admitting: Anesthesiology

## 2022-10-16 ENCOUNTER — Other Ambulatory Visit: Payer: Self-pay

## 2022-10-16 ENCOUNTER — Inpatient Hospital Stay (HOSPITAL_COMMUNITY): Admission: RE | Admit: 2022-10-16 | Payer: Medicaid Other | Source: Ambulatory Visit | Admitting: Surgery

## 2022-10-16 ENCOUNTER — Encounter (HOSPITAL_COMMUNITY): Payer: Self-pay

## 2022-10-16 ENCOUNTER — Encounter (HOSPITAL_COMMUNITY): Admission: EM | Disposition: A | Discharge status: Home / Self Care | Payer: Self-pay | Source: Home / Self Care

## 2022-10-16 DIAGNOSIS — K642 Third degree hemorrhoids: Secondary | ICD-10-CM | POA: Diagnosis not present

## 2022-10-16 DIAGNOSIS — K648 Other hemorrhoids: Secondary | ICD-10-CM | POA: Diagnosis not present

## 2022-10-16 DIAGNOSIS — K621 Rectal polyp: Secondary | ICD-10-CM | POA: Diagnosis not present

## 2022-10-16 DIAGNOSIS — K645 Perianal venous thrombosis: Secondary | ICD-10-CM

## 2022-10-16 DIAGNOSIS — D128 Benign neoplasm of rectum: Secondary | ICD-10-CM | POA: Diagnosis not present

## 2022-10-16 DIAGNOSIS — K623 Rectal prolapse: Secondary | ICD-10-CM | POA: Diagnosis not present

## 2022-10-16 DIAGNOSIS — Z87891 Personal history of nicotine dependence: Secondary | ICD-10-CM | POA: Diagnosis not present

## 2022-10-16 DIAGNOSIS — K626 Ulcer of anus and rectum: Secondary | ICD-10-CM | POA: Diagnosis not present

## 2022-10-16 DIAGNOSIS — I10 Essential (primary) hypertension: Secondary | ICD-10-CM

## 2022-10-16 DIAGNOSIS — K622 Anal prolapse: Secondary | ICD-10-CM

## 2022-10-16 DIAGNOSIS — J449 Chronic obstructive pulmonary disease, unspecified: Secondary | ICD-10-CM | POA: Diagnosis not present

## 2022-10-16 HISTORY — PX: PROCTOSCOPY TRANSANAL RESECTION OF RECTAL TUMOR WITH TAMIS SYSTEM: SHX6665

## 2022-10-16 LAB — CBC
HCT: 21.3 % — ABNORMAL LOW (ref 39.0–52.0)
Hemoglobin: 6.8 g/dL — CL (ref 13.0–17.0)
MCH: 29.2 pg (ref 26.0–34.0)
MCHC: 31.9 g/dL (ref 30.0–36.0)
MCV: 91.4 fL (ref 80.0–100.0)
Platelets: 266 10*3/uL (ref 150–400)
RBC: 2.33 MIL/uL — ABNORMAL LOW (ref 4.22–5.81)
RDW: 12.9 % (ref 11.5–15.5)
WBC: 4.9 10*3/uL (ref 4.0–10.5)
nRBC: 0 % (ref 0.0–0.2)

## 2022-10-16 LAB — HEMOGLOBIN: Hemoglobin: 7.1 g/dL — ABNORMAL LOW (ref 13.0–17.0)

## 2022-10-16 LAB — PREPARE RBC (CROSSMATCH)

## 2022-10-16 LAB — POCT I-STAT EG7
Acid-base deficit: 3 mmol/L — ABNORMAL HIGH (ref 0.0–2.0)
Bicarbonate: 23.4 mmol/L (ref 20.0–28.0)
Calcium, Ion: 1.17 mmol/L (ref 1.15–1.40)
HCT: 29 % — ABNORMAL LOW (ref 39.0–52.0)
Hemoglobin: 9.9 g/dL — ABNORMAL LOW (ref 13.0–17.0)
O2 Saturation: 98 %
Patient temperature: 36.8
Potassium: 4 mmol/L (ref 3.5–5.1)
Sodium: 136 mmol/L (ref 135–145)
TCO2: 25 mmol/L (ref 22–32)
pCO2, Ven: 48.5 mmHg (ref 44–60)
pH, Ven: 7.29 (ref 7.25–7.43)
pO2, Ven: 108 mmHg — ABNORMAL HIGH (ref 32–45)

## 2022-10-16 LAB — PROTIME-INR
INR: 1.1 (ref 0.8–1.2)
Prothrombin Time: 14.3 seconds (ref 11.4–15.2)

## 2022-10-16 LAB — RAPID URINE DRUG SCREEN, HOSP PERFORMED
Amphetamines: NOT DETECTED
Barbiturates: NOT DETECTED
Benzodiazepines: NOT DETECTED
Cocaine: NOT DETECTED
Opiates: NOT DETECTED
Tetrahydrocannabinol: POSITIVE — AB

## 2022-10-16 SURGERY — PROCTOSCOPY TRANSANAL RESECTION OF RECTAL TUMOR WITH TAMIS SYSTEM
Anesthesia: General | Site: Rectum

## 2022-10-16 MED ORDER — PROPOFOL 10 MG/ML IV BOLUS
INTRAVENOUS | Status: AC
  Filled 2022-10-16: qty 20

## 2022-10-16 MED ORDER — SODIUM CHLORIDE 0.9 % IV SOLN: 250.0000 mL | INTRAVENOUS | Status: DC | PRN

## 2022-10-16 MED ORDER — BUPIVACAINE LIPOSOME 1.3 % IJ SUSP
INTRAMUSCULAR | Status: AC
  Filled 2022-10-16: qty 20

## 2022-10-16 MED ORDER — 0.9 % SODIUM CHLORIDE (POUR BTL) OPTIME
TOPICAL | Status: DC | PRN
  Administered 2022-10-16: 1000 mL

## 2022-10-16 MED ORDER — SUGAMMADEX SODIUM 200 MG/2ML IV SOLN
INTRAVENOUS | Status: DC | PRN
  Administered 2022-10-16: 200 mg via INTRAVENOUS

## 2022-10-16 MED ORDER — LACTATED RINGERS IV SOLN: INTRAVENOUS | Status: DC | PRN

## 2022-10-16 MED ORDER — SUCCINYLCHOLINE CHLORIDE 200 MG/10ML IV SOSY
PREFILLED_SYRINGE | INTRAVENOUS | Status: DC | PRN
  Administered 2022-10-16: 100 mg via INTRAVENOUS

## 2022-10-16 MED ORDER — OXYCODONE HCL 5 MG PO TABS
5.0000 mg | ORAL_TABLET | ORAL | Status: DC | PRN
  Administered 2022-10-16 – 2022-10-17 (×2): 10 mg via ORAL
  Filled 2022-10-16 (×2): qty 2

## 2022-10-16 MED ORDER — ROCURONIUM BROMIDE 10 MG/ML (PF) SYRINGE
PREFILLED_SYRINGE | INTRAVENOUS | Status: AC
  Filled 2022-10-16: qty 10

## 2022-10-16 MED ORDER — MIDAZOLAM HCL 2 MG/2ML IJ SOLN
INTRAMUSCULAR | Status: DC | PRN
  Administered 2022-10-16: 2 mg via INTRAVENOUS

## 2022-10-16 MED ORDER — SODIUM CHLORIDE 0.9% IV SOLUTION: Freq: Once | INTRAVENOUS | Status: DC

## 2022-10-16 MED ORDER — GABAPENTIN 100 MG PO CAPS
300.0000 mg | ORAL_CAPSULE | ORAL | Status: DC
  Filled 2022-10-16: qty 1

## 2022-10-16 MED ORDER — FENTANYL CITRATE PF 50 MCG/ML IJ SOSY
25.0000 ug | PREFILLED_SYRINGE | INTRAMUSCULAR | Status: DC | PRN
  Administered 2022-10-16 (×2): 50 ug via INTRAVENOUS

## 2022-10-16 MED ORDER — FENTANYL CITRATE (PF) 250 MCG/5ML IJ SOLN
INTRAMUSCULAR | Status: DC | PRN
  Administered 2022-10-16 (×7): 50 ug via INTRAVENOUS

## 2022-10-16 MED ORDER — BUPIVACAINE LIPOSOME 1.3 % IJ SUSP
INTRAMUSCULAR | Status: DC | PRN
  Administered 2022-10-16: 50 mL

## 2022-10-16 MED ORDER — GABAPENTIN 100 MG PO CAPS
300.0000 mg | ORAL_CAPSULE | Freq: Three times a day (TID) | ORAL | Status: DC
  Administered 2022-10-16 – 2022-10-17 (×3): 300 mg via ORAL
  Filled 2022-10-16: qty 3
  Filled 2022-10-16: qty 1
  Filled 2022-10-16: qty 3

## 2022-10-16 MED ORDER — PHENYLEPHRINE HCL-NACL 20-0.9 MG/250ML-% IV SOLN
INTRAVENOUS | Status: DC | PRN
  Administered 2022-10-16: 25 ug/min via INTRAVENOUS

## 2022-10-16 MED ORDER — DIBUCAINE (PERIANAL) 1 % EX OINT
TOPICAL_OINTMENT | CUTANEOUS | Status: DC | PRN
  Administered 2022-10-16: 1 via RECTAL

## 2022-10-16 MED ORDER — DEXAMETHASONE SODIUM PHOSPHATE 10 MG/ML IJ SOLN
INTRAMUSCULAR | Status: AC
  Filled 2022-10-16: qty 1

## 2022-10-16 MED ORDER — OXYCODONE HCL 5 MG PO TABS: 5.0000 mg | ORAL_TABLET | Freq: Four times a day (QID) | ORAL | 0 refills | Status: DC | PRN

## 2022-10-16 MED ORDER — HYDROMORPHONE HCL 1 MG/ML IJ SOLN
0.5000 mg | INTRAMUSCULAR | Status: DC | PRN
  Administered 2022-10-16 – 2022-10-17 (×4): 2 mg via INTRAVENOUS
  Filled 2022-10-16 (×4): qty 2

## 2022-10-16 MED ORDER — SODIUM CHLORIDE 0.9% IV SOLUTION: Freq: Once | INTRAVENOUS | Status: AC

## 2022-10-16 MED ORDER — METRONIDAZOLE 500 MG PO TABS: 1000.0000 mg | ORAL_TABLET | ORAL | Status: DC

## 2022-10-16 MED ORDER — ONDANSETRON HCL 4 MG/2ML IJ SOLN
INTRAMUSCULAR | Status: AC
  Filled 2022-10-16: qty 2

## 2022-10-16 MED ORDER — SODIUM CHLORIDE 0.9% FLUSH: 3.0000 mL | INTRAVENOUS | Status: DC | PRN

## 2022-10-16 MED ORDER — FENTANYL CITRATE (PF) 100 MCG/2ML IJ SOLN
INTRAMUSCULAR | Status: AC
  Filled 2022-10-16: qty 2

## 2022-10-16 MED ORDER — PROPOFOL 10 MG/ML IV BOLUS
INTRAVENOUS | Status: DC | PRN
  Administered 2022-10-16: 100 mg via INTRAVENOUS

## 2022-10-16 MED ORDER — PHENYLEPHRINE HCL (PRESSORS) 10 MG/ML IV SOLN
INTRAVENOUS | Status: AC
  Filled 2022-10-16: qty 1

## 2022-10-16 MED ORDER — SODIUM CHLORIDE 0.9 % IV SOLN: INTRAVENOUS | Status: DC | PRN

## 2022-10-16 MED ORDER — SODIUM CHLORIDE (PF) 0.9 % IJ SOLN
INTRAMUSCULAR | Status: AC
  Filled 2022-10-16: qty 20

## 2022-10-16 MED ORDER — BUPIVACAINE-EPINEPHRINE (PF) 0.5% -1:200000 IJ SOLN
INTRAMUSCULAR | Status: AC
  Filled 2022-10-16: qty 30

## 2022-10-16 MED ORDER — DEXAMETHASONE SODIUM PHOSPHATE 10 MG/ML IJ SOLN
INTRAMUSCULAR | Status: DC | PRN
  Administered 2022-10-16: 10 mg via INTRAVENOUS

## 2022-10-16 MED ORDER — SUCCINYLCHOLINE CHLORIDE 200 MG/10ML IV SOSY
PREFILLED_SYRINGE | INTRAVENOUS | Status: AC
  Filled 2022-10-16: qty 10

## 2022-10-16 MED ORDER — DIBUCAINE (PERIANAL) 1 % EX OINT
TOPICAL_OINTMENT | CUTANEOUS | Status: AC
  Filled 2022-10-16: qty 28

## 2022-10-16 MED ORDER — FENTANYL CITRATE (PF) 250 MCG/5ML IJ SOLN
INTRAMUSCULAR | Status: AC
  Filled 2022-10-16: qty 5

## 2022-10-16 MED ORDER — GABAPENTIN 100 MG PO CAPS: 200.0000 mg | ORAL_CAPSULE | Freq: Three times a day (TID) | ORAL | Status: DC

## 2022-10-16 MED ORDER — SODIUM CHLORIDE 0.9% FLUSH
3.0000 mL | Freq: Two times a day (BID) | INTRAVENOUS | Status: DC
  Administered 2022-10-16: 3 mL via INTRAVENOUS

## 2022-10-16 MED ORDER — LIDOCAINE HCL (PF) 2 % IJ SOLN
INTRAMUSCULAR | Status: AC
  Filled 2022-10-16: qty 5

## 2022-10-16 MED ORDER — ACETAMINOPHEN 500 MG PO TABS
1000.0000 mg | ORAL_TABLET | Freq: Four times a day (QID) | ORAL | Status: DC
  Administered 2022-10-16 – 2022-10-17 (×2): 1000 mg via ORAL
  Filled 2022-10-16 (×2): qty 2

## 2022-10-16 MED ORDER — ALBUTEROL SULFATE HFA 108 (90 BASE) MCG/ACT IN AERS: 2.0000 | INHALATION_SPRAY | Freq: Four times a day (QID) | RESPIRATORY_TRACT | 0 refills | Status: DC | PRN

## 2022-10-16 MED ORDER — MIDAZOLAM HCL 2 MG/2ML IJ SOLN
INTRAMUSCULAR | Status: AC
  Filled 2022-10-16: qty 2

## 2022-10-16 MED ORDER — FENTANYL CITRATE PF 50 MCG/ML IJ SOSY
PREFILLED_SYRINGE | INTRAMUSCULAR | Status: AC
  Administered 2022-10-16: 50 ug via INTRAVENOUS
  Filled 2022-10-16: qty 3

## 2022-10-16 MED ORDER — ACETAMINOPHEN 500 MG PO TABS: 1000.0000 mg | ORAL_TABLET | ORAL | Status: DC

## 2022-10-16 MED ORDER — LIDOCAINE 2% (20 MG/ML) 5 ML SYRINGE
INTRAMUSCULAR | Status: DC | PRN
  Administered 2022-10-16: 20 mg via INTRAVENOUS

## 2022-10-16 MED ORDER — ZINC OXIDE 40 % EX OINT: TOPICAL_OINTMENT | Freq: Two times a day (BID) | CUTANEOUS | Status: DC

## 2022-10-16 MED ORDER — NEOMYCIN SULFATE 500 MG PO TABS: 1000.0000 mg | ORAL_TABLET | ORAL | Status: DC

## 2022-10-16 MED ORDER — ROCURONIUM BROMIDE 10 MG/ML (PF) SYRINGE
PREFILLED_SYRINGE | INTRAVENOUS | Status: DC | PRN
  Administered 2022-10-16: 50 mg via INTRAVENOUS
  Administered 2022-10-16: 10 mg via INTRAVENOUS

## 2022-10-16 MED ORDER — ONDANSETRON HCL 4 MG/2ML IJ SOLN
INTRAMUSCULAR | Status: DC | PRN
  Administered 2022-10-16: 4 mg via INTRAVENOUS

## 2022-10-16 SURGICAL SUPPLY — 57 items
BAG COUNTER SPONGE SURGICOUNT (BAG) IMPLANT
BAG SPNG CNTER NS LX DISP (BAG)
BALL CTTN LRG ABS STRL LF (GAUZE/BANDAGES/DRESSINGS) ×3
BLADE SURG 15 STRL LF DISP TIS (BLADE) IMPLANT
BLADE SURG 15 STRL SS (BLADE)
BRIEF MESH DISP LRG (UNDERPADS AND DIAPERS) IMPLANT
CABLE HIGH FREQUENCY MONO STRZ (ELECTRODE) ×1 IMPLANT
COTTONBALL LRG STERILE PKG (GAUZE/BANDAGES/DRESSINGS) IMPLANT
DRAPE LAPAROTOMY T 102X78X121 (DRAPES) IMPLANT
DRAPE SURG IRRIG POUCH 19X23 (DRAPES) ×1 IMPLANT
DRAPE WARM FLUID 44X44 (DRAPES) ×1 IMPLANT
ELECT PENCIL ROCKER SW 15FT (MISCELLANEOUS) IMPLANT
ELECT REM PT RETURN 15FT ADLT (MISCELLANEOUS) ×1 IMPLANT
GAUZE 4X4 16PLY ~~LOC~~+RFID DBL (SPONGE) ×1 IMPLANT
GAUZE PAD ABD 8X10 STRL (GAUZE/BANDAGES/DRESSINGS) IMPLANT
GAUZE SPONGE 4X4 12PLY STRL (GAUZE/BANDAGES/DRESSINGS) IMPLANT
GLOVE ECLIPSE 8.0 STRL XLNG CF (GLOVE) ×2 IMPLANT
GLOVE INDICATOR 8.0 STRL GRN (GLOVE) ×2 IMPLANT
GOWN STRL REUS W/ TWL XL LVL3 (GOWN DISPOSABLE) ×4 IMPLANT
GOWN STRL REUS W/TWL XL LVL3 (GOWN DISPOSABLE) ×4
IRRIG SUCT STRYKERFLOW 2 WTIP (MISCELLANEOUS) ×1
IRRIGATION SUCT STRKRFLW 2 WTP (MISCELLANEOUS) ×1 IMPLANT
KIT BASIN OR (CUSTOM PROCEDURE TRAY) ×1 IMPLANT
KIT TURNOVER KIT A (KITS) IMPLANT
LEGGING LITHOTOMY PAIR STRL (DRAPES) ×1 IMPLANT
NEEDLE HYPO 22GX1.5 SAFETY (NEEDLE) ×1 IMPLANT
PACK BASIC VI WITH GOWN DISP (CUSTOM PROCEDURE TRAY) ×1 IMPLANT
PAD POSITIONING PINK XL (MISCELLANEOUS) ×1 IMPLANT
PENCIL SMOKE EVACUATOR (MISCELLANEOUS) IMPLANT
RETRACTOR LONE STAR DISPOSABLE (INSTRUMENTS) IMPLANT
RETRACTOR STAY HOOK 5MM (MISCELLANEOUS) IMPLANT
SCISSORS LAP 5X35 DISP (ENDOMECHANICALS) ×1 IMPLANT
SET TUBE SMOKE EVAC HIGH FLOW (TUBING) ×1 IMPLANT
SHEARS HARMONIC ACE PLUS 36CM (ENDOMECHANICALS) ×1 IMPLANT
STOPCOCK 4 WAY LG BORE MALE ST (IV SETS) IMPLANT
SURGILUBE 2OZ TUBE FLIPTOP (MISCELLANEOUS) ×1 IMPLANT
SUT CHROMIC 3 0 SH 27 (SUTURE) IMPLANT
SUT PDS AB 2-0 CT2 27 (SUTURE) IMPLANT
SUT PDS AB 3-0 SH 27 (SUTURE) IMPLANT
SUT PROLENE 2 0 SH DA (SUTURE) IMPLANT
SUT SILK 2 0 (SUTURE)
SUT SILK 2 0 SH CR/8 (SUTURE) IMPLANT
SUT SILK 2-0 18XBRD TIE 12 (SUTURE) IMPLANT
SUT SILK 3 0 SH 30 (SUTURE) IMPLANT
SUT SILK 3 0 SH CR/8 (SUTURE) IMPLANT
SUT V-LOC BARB 180 2/0GR6 GS22 (SUTURE)
SUT VIC AB 2-0 UR6 27 (SUTURE) IMPLANT
SUT VIC AB 3-0 SH 27 (SUTURE)
SUT VIC AB 3-0 SH 27XBRD (SUTURE) IMPLANT
SUTURE V-LC BRB 180 2/0GR6GS22 (SUTURE) IMPLANT
SYR 20ML LL LF (SYRINGE) ×1 IMPLANT
SYR BULB IRRIG 60ML STRL (SYRINGE) IMPLANT
TOWEL OR 17X26 10 PK STRL BLUE (TOWEL DISPOSABLE) ×1 IMPLANT
TOWEL OR NON WOVEN STRL DISP B (DISPOSABLE) ×1 IMPLANT
TRAY FOLEY MTR SLVR 16FR STAT (SET/KITS/TRAYS/PACK) ×1 IMPLANT
TUBING CONNECTING 10 (TUBING) IMPLANT
YANKAUER SUCT BULB TIP 10FT TU (MISCELLANEOUS) IMPLANT

## 2022-10-16 NOTE — Op Note (Signed)
10/16/2022  2:02 PM  PATIENT:  Kenneth Mcdowell  56 y.o. male  Patient Care Team: [name], MD as PCP - General (Pulmonary Disease) Charlott Rakes, MD (Family Medicine) [name], MD as Consulting Physician (Gastroenterology) [name], MD as Consulting Physician (General Surgery)  PRE-OPERATIVE DIAGNOSIS:  RECTAL POLYP WITH BLEEDING  POST-OPERATIVE DIAGNOSIS:   ADENOMATOUS RECTAL POLYP PROLAPSED WITH BLEEDING GRADE 3 PROLAPSING INTERNAL HEMORRHOIDS WITH BLEEDING  PROCEDURE:   PARTIAL PROCTECTOMY OF RECTAL MASS Internal hemorrhoidectomy x1 External hemorrhoidectomy x1 (en bloc) Internal hemorrhoidal ligation and pexy Fecal disimpaction Anorectal examination under anesthesia  SURGEON:  Adin Hector, MD  ANESTHESIA:   General Anorectal & Local field block (0.25% bupivacaine with epinephrine mixed with Liposomal bupivacaine (Experel) for good perioperative and postoperative pain control  EBL:  Total I/O In: 1521.7 [I.V.:550; Blood:871.7; IV Piggyback:100] Out: 375 [Urine:350; Blood:25].  See operative record  Delay start of Pharmacological VTE agent (>24hrs) due to surgical blood loss or risk of bleeding:  NO  DRAINS: NONE  SPECIMENS:   Rectal polyp (see below) Internal hemorrhoid x1  DISPOSITION OF SPECIMEN:  PATHOLOGY  COUNTS:  YES  PLAN OF CARE: Discharge home after PACU  PATIENT DISPOSITION:  PACU - hemodynamically stable.  INDICATION: Pleasant patient with struggles with hemorrhoids.  Found to have worsening rectal bleeding and 3 adenomatous polyps found.  Most distal on very low and felt not amenable to endoscopic resection.  Referred for surgical management.  Patient had worsening pain and prolapse of the mass with significant major bleeding.  Came to the office yesterday covered in blood.  Direct mission attempted but could not do it so therefore sent to ER.  Given IV iron and transfused.  Stabilized.  Recommendation made for  anorectal examination under esthesia with resection.   The anatomy & physiology of the anorectal region was discussed.  The pathophysiology of anorectal & rectal masses, polyps, hemorrhoids and differential diagnosis was discussed.  Natural history risks without surgery was discussed.   I stressed the importance of a bowel regimen to have daily soft bowel movements to minimize progression of disease.    I recommended transanal excision of the concerning region.  Open, TEM (transanal endoscopic microsurgery through rigid large anoscope), robotic transanal MIS / TAMIS approachess discussed.  Risk benefits alternatives discussed.  Risks such as bleeding, infection, urinary difficulties, injury to other organs, need for repair of tissues / organs, need for further treatment, heart attack, death, and other risks were discussed.   Risk for sphincter injury incontinence and irregular bowel movements noted.  Need for pelvic floor physical therapy in the future.  Possible local recurrence or repeated surgery.  Possible need for larger low anterior or abdominal perineal resection  needed.  I noted a good likelihood this will help address the problem.  Goals of post-operative recovery were discussed as well.  Possibility that this will not correct all symptoms was explained.  Post-operative pain, bleeding, constipation, fecal incontinence, and other problems after surgery were discussed.  We will work to minimize complications.  Questions were answered.  The patient expresses understanding & wishes to proceed with surgery.  OR FINDINGS: Chronically prolapsed distal rectal polyp right anterior very friable and bleeding.  Partial proctectomy done through transanal approach with cautery and harmonic.  Chronically irritated external hemorrhoid right anterior excised en bloc.  The resulting mass was 5x5 cm in size  Pin placement on pathology specimen: Proximal margin: Pink Distal margin:Black Right lateral: White Left  anterior:  Yellow  The closure rests 0-5 cm from the anal verge in the right anterior location.  Longitudinal closure done  Patient with persistently inflamed left lateral internal hemorrhoid partially prolapsed out.  Ligation pexy and hemorrhoidectomy done.  DESCRIPTION:   Informed consent was confirmed. Patient underwent general anesthesia without difficulty.  Foley catheter sterilely placed.  Patient was placed into  lithotomy positioning.   Patient had already received a second unit of packed red cells (first 1 done in the ER ) before I began surgery.  Had a third unit pRBCs transfused during surgery.  The perianal region was prepped and draped in sterile fashion. Surgical time-out confirmed our plan.  Patient was disimpacted with large volume of stool.  Some irrigation and cleared out to see the rectum better.  Patient had thickened chronically irritated prolapsing mass consistent with adenomatous polyp involving about a quarter of the circumference.  Was able to get it partially distended but it was contiguous with a large external hemorrhoid.  I did digital rectal examination and then transitioned over to anoscopy to get a sense of the anatomy.  Findings noted above.   I proceeded to do resection of the mass with the use of a Parks anal dilator.  I used touch point cautery to mark 1 cm margins laterally and proximally.  I used cautery to excise the external hemorrhoid and I longitudinal biconcave fashion.  Freddrick March that off the internal sphincter.  I then did a full-thickness excision of the rectal polyp using a harmonic scalpel.  It seemed to be more longitudinal so did a longitudinal biconcave excision.  We assured hemostasis.  I pinned the polyp and en bloc external hemorrhoid with pins on cork as noted above and walked that down to pathology.  Discussed with the histology processing team to show orientation.  I went back and scrubbed in.  I closed this partial proctectomy wound.  It actually  seemed to come to get better longitudinally.  He had a rather redundant rectum somewhat stretched out and I was able to do this using 2-0 Vicryl in a running fashion starting in the mid/distal rectal junction and running that down logically to close the wound.  Came down to the anal verge.  Transition to interrupted horizontal mattress 2-0 Vicryl suture.  Switched over to 3-0 chromic horizontal mattress suture again in a radial fashion to close the wound down.  I did this over a large Programmer, systems.    I redid anoscopy examination.  Patient still had a very large left lateral inflamed internal hemorrhoid.  I decided to do ligation pexy and limited longitudinal excision since there was evidence of prolapse and irritation and bleeding there as well.  I used a 2-0 Vicryl suture on a UR-6 needle in a figure-of-eight fashion 6 cm proximal to the anal verge.  I started at the largest hemorrhoid pile.  Because of redundant hemorrhoidal tissue too bulky to merely ligate or pexy, I excised the excess internal hemorrhoid pile longitudinally in a fusiform biconcave fashion, sparing the anal canal to avoid narrowing.  I then ran that stitch longitudinally more distally to close the hemorrhoidectomy wound to the anal verge over a large Parks self-retaining anal retractor to avoid narrowing of the anal canal.  I then tied that stitch down to cause a hemorrhoidopexy.   His right posterior hemorrhoidal pile did not seem particularly irritated and I wish to avoid being too aggressive ligation pexy given the need for partial proctectomy  and hemorrhoidectomy already.  I redid anoscopy and examination.  I noticed oozing at the partial proctectomy closure and controlled that with a 2-0 Vicryl figure-of-eight suture to good result.  I did reinspection.  Hemostasis was good.  At completion of this, all hemorrhoids had been removed or reduced into the rectum.  There is no more prolapse.  Internal & external  anatomy was more more normal.  Hemostasis was good. Check on hemoglobin noted it was in the mid nines now.  No shock.  No pressors.  Later the anorectal block.  Fluffed gauze was on-laid over the perianal region.  No packing done.  Patient is being extubated go to go to the recovery room.  I had discussed postop care in detail with the patient in the preop holding area.  Instructions for post-operative recovery and prescriptions are written. I discussed operative findings, updated the patient's status, discussed probable steps to recovery, and gave postoperative recommendations to the patient's mother, Mellody Life .  Recommendations were made.  Questions were answered.  She expressed understanding & appreciation.  Adin Hector, M.D., F.A.C.S. Gastrointestinal and Minimally Invasive Surgery [facility] [address], [place], [place] [address] [phone] Main / Paging

## 2022-10-16 NOTE — ED Notes (Signed)
On attempt to swallow flagyl, pt. Vomits pills. Unable to swallow due to taste.

## 2022-10-16 NOTE — Anesthesia Postprocedure Evaluation (Signed)
Anesthesia Post Note  Patient: Kenneth Mcdowell  Procedure(s) Performed: Celedonio Miyamoto PARTIAL PROCTECTOMY OF RECTAL MASS (Rectum)     Patient location during evaluation: PACU Anesthesia Type: General Level of consciousness: awake and alert Pain management: pain level controlled Vital Signs Assessment: post-procedure vital signs reviewed and stable Respiratory status: spontaneous breathing, nonlabored ventilation, respiratory function stable and patient connected to nasal cannula oxygen Cardiovascular status: blood pressure returned to baseline and stable Postop Assessment: no apparent nausea or vomiting Anesthetic complications: no  No notable events documented.  Last Vitals:  Vitals:   10/16/22 1530 10/16/22 1545  BP: (!) 155/110 (!) 141/88  Pulse: 89 89  Resp: 17 18  Temp:    SpO2: 99% 96%    Last Pain:  Vitals:   10/16/22 1445  TempSrc:   PainSc: 9                  Rameen Gohlke L Asuncion Shibata

## 2022-10-16 NOTE — Progress Notes (Signed)
Kenneth Mcdowell [id number] 09-Nov-1966  CARE TEAM:  PCP: [name], MD  Outpatient Care Team: Patient Care Team: [name], MD as PCP - General (Pulmonary Disease) Charlott Rakes, MD (Family Medicine) [name], MD as Consulting Physician (Gastroenterology)  Inpatient Treatment Team: Treatment Team: Attending Provider: [name], Md, MD; Consulting Physician: [name], MD; Consulting Physician: [name], Md, MD; Utilization Review: [name], RN; Technician: [name]; Registered Nurse: [name], RN   Problem List:   Principal Problem:   Prolapsed rectal polyp with bleeding Active Problems:   Acute lower GI bleeding   Adenomatous polyp of rectum   GERD (gastroesophageal reflux disease)   Bipolar disorder ([place])   ADHD   History of multiple trauma   Adjustment disorder with mixed anxiety and depressed mood   Tobacco chew use   Chronic right shoulder pain   History of cocaine use   Day of Surgery    Assessment  Chronically prolapsed large adenomatous rectal polyp with due to on chronic bleeding.  [facility] Stay = 1 days)  Plan:  -Hemoglobin is dropped down rather borderline.  I recommended being transfused unit of blood.  Discussed with anesthesia who agrees.  Discussed with ER department.  RN is working with blood bank to try and get it done urgently so we can get him transfused and surgery later this morning.  The patient could have gotten bowel prep to get cleaned out a little bit as well as get some topical agents to help cover soothe and protect the bleeding.  He is refused all this according to nursing.  OR later this morning.  Anorectal examination under esthesia with probably TEM partial proctectomy of chronically bleeding prolapsed rectal mass.  Discussed with the patient.  Discussed with the ER nurse.  Discussed with the anesthesia team.  Discussed with the OR team.  The anatomy & physiology of the anorectal  region was discussed.  The pathophysiology of anorectal & rectal masses, polyps, hemorrhoids and differential diagnosis was discussed.  Natural history risks without surgery was discussed.   I stressed the importance of a bowel regimen to have daily soft bowel movements to minimize progression of disease.    I recommended transanal excision of the concerning region.  Open, TEM (transanal endoscopic microsurgery through rigid large anoscope), robotic transanal MIS / TAMIS approachess discussed.  Risk benefits alternatives discussed.  Risks such as bleeding, infection, urinary difficulties, injury to other organs, need for repair of tissues / organs, need for further treatment, heart attack, death, and other risks were discussed.   Risk for sphincter injury incontinence and irregular bowel movements noted.  Need for pelvic floor physical therapy in the future.  Possible local recurrence or repeated surgery.  Possible need for larger low anterior or abdominal perineal resection  needed.  I noted a good likelihood this will help address the problem.  Goals of post-operative recovery were discussed as well.  Possibility that this will not correct all symptoms was explained.  Post-operative pain, bleeding, constipation, fecal incontinence, and other problems after surgery were discussed.  We will work to minimize complications.  Questions were answered.  The patient expresses understanding & wishes to proceed with surgery.    Would like to get urine drug test to make sure there are no other surprises.  He has refused that so far.  Discussed with anesthesia.  It is an urgent case and may not change things.  Might need to get it once he  gets a Foley catheter once he is asleep.  Follow his postoperative course.  May need to have internal medicine involved if he has other issues.  History of some COPD given history of penetrating chest trauma and prior smoking.  Stable on inhalers.  No evidence of hypoxia at this  time.  Hypertension most likely related to pain and discomfort and anxiety.  As needed medications as needed but try and hold off on being too aggressive since he is having bleeding.  VTE prophylaxis- SCDs.  Hold off on prophylaxis given the fact that he has active GI bleeding.  Mobilize as tolerated to help recovery  Disposition:  Disposition:  The patient is from: Home  Anticipate discharge to:  Home  Anticipated Date of Discharge is:  December 7,2023    Barriers to discharge:  Need for inpatient procedure/study and Pending Clinical improvement (more likely than not)  Patient currently is NOT MEDICALLY STABLE for discharge from the hospital from a surgery standpoint.      I reviewed nursing notes, ED provider notes, last 24 h vitals and pain scores, last 48 h intake and output, last 24 h labs and trends, and last 24 h imaging results. I have reviewed this patient's available data, including medical history, events of note, test results, etc as part of my evaluation.  A significant portion of that time was spent in counseling.  Care during the described time interval was provided by me.  This care required moderate level of medical decision making.  10/16/2022    Subjective: (Chief complaint)  Not able to get the patient direct admitted.  Therefore sent to the ER.  Sleeping in ER room.  He had refused bowel prep and topical agents.  Nursing just outside room trying to work with him.  Having some oozing but not had to change the dressing overnight.  Pain is not too bad.  He has been able to urinate.  Objective:  Vital signs:  Vitals:   10/16/22 0003 10/16/22 0400 10/16/22 0442 10/16/22 0515  BP:  117/76  135/84  Pulse:  100  97  Resp:  18  15  Temp: 98.3 F (36.8 C)  98.8 F (37.1 C)   TempSrc: Oral  Oral   SpO2:  94%  98%    Last BM Date : 10/15/22  Intake/Output   Yesterday:  No intake/output data recorded. This shift:  No intake/output data  recorded.  Bowel function:  Flatus: YES  BM:  No  Drain: (No drain)   Physical Exam:  General: Pt awakens to be alert in mild acute distress Eyes: PERRL, normal EOM.  Sclera clear.  No icterus Neuro: CN II-XII intact w/o focal sensory/motor deficits. Lymph: No head/neck/groin lymphadenopathy Psych:  No delerium/psychosis/paranoia.  Oriented x 4 HENT: Normocephalic, Mucus membranes moist.  No thrush Neck: Supple, No tracheal deviation.  No obvious thyromegaly Chest: No pain to chest wall compression.  Good respiratory excursion.  No audible wheezing CV:  Pulses intact.  Regular rhythm.  No major extremity edema MS: Normal AROM mjr joints.  No obvious deformity Abdomen: Soft.  Nondistended.  Mildly tender at incisions only.  No evidence of peritonitis.  No incarcerated hernias.  Patient has diaper with old blood within it. 's physical exam this morning.  Ext:   No deformity.  No mjr edema.  No cyanosis Skin: No petechiae / purpurea.  No major sores.  Warm and dry    Results:   Cultures: No results found for this or  any previous visit (from the past 720 hour(s)).  Labs: Results for orders placed or performed during the hospital encounter of 10/15/22 (from the past 48 hour(s))  CBC with Differential     Status: Abnormal   Collection Time: 10/15/22  4:23 PM  Result Value Ref Range   WBC 7.2 4.0 - 10.5 K/uL   RBC 2.60 (L) 4.22 - 5.81 MIL/uL   Hemoglobin 7.5 (L) 13.0 - 17.0 g/dL   HCT 23.6 (L) 39.0 - 52.0 %   MCV 90.8 80.0 - 100.0 fL   MCH 28.8 26.0 - 34.0 pg   MCHC 31.8 30.0 - 36.0 g/dL   RDW 12.6 11.5 - 15.5 %   Platelets 300 150 - 400 K/uL   nRBC 0.0 0.0 - 0.2 %   Neutrophils Relative % 72 %   Neutro Abs 5.1 1.7 - 7.7 K/uL   Lymphocytes Relative 17 %   Lymphs Abs 1.3 0.7 - 4.0 K/uL   Monocytes Relative 10 %   Monocytes Absolute 0.7 0.1 - 1.0 K/uL   Eosinophils Relative 1 %   Eosinophils Absolute 0.1 0.0 - 0.5 K/uL   Basophils Relative 0 %   Basophils Absolute  0.0 0.0 - 0.1 K/uL   Immature Granulocytes 0 %   Abs Immature Granulocytes 0.02 0.00 - 0.07 K/uL    Comment: Performed at [facility], [place] [address] [place], [place] [address]  Basic metabolic panel     Status: Abnormal   Collection Time: 10/15/22  4:23 PM  Result Value Ref Range   Sodium 136 135 - 145 mmol/L   Potassium 3.8 3.5 - 5.1 mmol/L   Chloride 105 98 - 111 mmol/L   CO2 23 22 - 32 mmol/L   Glucose, Bld 104 (H) 70 - 99 mg/dL    Comment: Glucose reference range applies only to samples taken after fasting for at least 8 hours.   BUN 10 6 - 20 mg/dL   Creatinine, Ser 0.75 0.61 - 1.24 mg/dL   Calcium 8.6 (L) 8.9 - 10.3 mg/dL   GFR, Estimated >60 >60 mL/min    Comment: (NOTE) Calculated using the CKD-EPI Creatinine Equation (2021)    Anion gap 8 5 - 15    Comment: Performed at [facility], [place] [address] [place], [place] [address]  Type and screen [place]     Status: None   Collection Time: 10/15/22  5:45 PM  Result Value Ref Range   ABO/RH(D) A POS    Antibody Screen NEG    Sample Expiration      10/18/2022,2359 Performed at [facility], [place], [place] [address], [place], [place] [address]   Protime-INR     Status: None   Collection Time: 10/16/22 12:12 AM  Result Value Ref Range   Prothrombin Time 14.3 11.4 - 15.2 seconds   INR 1.1 0.8 - 1.2    Comment: (NOTE) INR goal varies based on device and disease states. Performed at [facility], [place] [address], [place], [place] [address]   Hemoglobin     Status: Abnormal   Collection Time: 10/16/22 12:12 AM  Result Value Ref Range   Hemoglobin 7.1 (L) 13.0 - 17.0 g/dL    Comment: Performed at [facility], [place] [address] [place], [place] [address]  Prepare RBC (crossmatch)     Status: None   Collection Time: 10/16/22  7:29 AM  Result Value Ref Range   Order Confirmation  ORDER PROCESSED BY BLOOD  BANK Performed at [facility], [place] [address] [place], [place] [address]     Imaging / Studies: No results found.  Medications / Allergies: per chart  Antibiotics: Anti-infectives (From admission, onward)    Start     Dose/Rate Route Frequency Ordered Stop   10/16/22 1300  metroNIDAZOLE (FLAGYL) tablet 1,000 mg  Status:  Discontinued       Note to Pharmacy: Take 2 pills (='1000mg'$ ) by mouth at 1pm, 3pm, and 10pm the day before your colorectal operation   1,000 mg Oral 3 times per day on Tue 10/16/22 0718 10/16/22 0724   10/16/22 1300  neomycin (MYCIFRADIN) tablet 1,000 mg  Status:  Discontinued        1,000 mg Oral 3 times per day on Tue 10/16/22 0718 10/16/22 0724   10/16/22 1200  cefoTEtan (CEFOTAN) 2 g in sodium chloride 0.9 % 100 mL IVPB        2 g 200 mL/hr over 30 Minutes Intravenous On call to O.R. 10/15/22 1830 10/17/22 0559   10/16/22 0200  neomycin (MYCIFRADIN) tablet 1,000 mg       See Hyperspace for full Linked Orders Report.   1,000 mg Oral 3 times per day 10/15/22 1830 10/17/22 0159   10/16/22 0200  metroNIDAZOLE (FLAGYL) tablet 1,000 mg       See Hyperspace for full Linked Orders Report.   1,000 mg Oral 3 times per day 10/15/22 1830 10/17/22 0159         Note: Portions of this report may have been transcribed using voice recognition software. Every effort was made to ensure accuracy; however, inadvertent computerized transcription errors may be present.   Any transcriptional errors that result from this process are unintentional.    Adin Hector, MD, FACS, MASCRS Esophageal, Gastrointestinal & Colorectal Surgery Robotic and Minimally Invasive Surgery  [facility]. [address] [place], [place] [address] [phone] Fax [phone] Main  CONTACT INFORMATION:  Weekday (9AM-5PM): Call [facility] main office at [phone]  Weeknight (5PM-9AM) or Weekend/Holiday: Check  [email or web address] (password " [email or web address]") for General Surgery [facility] coverage  (Please, do not use SecureChat as it is not reliable communication to reach operating surgeons for immediate patient care given surgeries/outpatient duties/clinic/cross-coverage/off post-call which would lead to a delay in care.  Epic staff messaging available for outptient concerns, but may not be answered for 48 hours or more).     10/16/2022  8:36 AM

## 2022-10-16 NOTE — Anesthesia Procedure Notes (Signed)
Procedure Name: Intubation Date/Time: 10/16/2022 12:16 PM  Performed by: Lollie Sails, CRNAPre-anesthesia Checklist: Patient identified, Emergency Drugs available, Suction available, Patient being monitored and Timeout performed Patient Re-evaluated:Patient Re-evaluated prior to induction Oxygen Delivery Method: Circle system utilized Preoxygenation: Pre-oxygenation with 100% oxygen Induction Type: IV induction and Rapid sequence Laryngoscope Size: Miller and 3 Grade View: Grade I Tube type: Oral Tube size: 8.0 mm Number of attempts: 1 Airway Equipment and Method: Stylet Placement Confirmation: ETT inserted through vocal cords under direct vision, positive ETCO2 and breath sounds checked- equal and bilateral Secured at: 24 cm Tube secured with: Tape Dental Injury: Teeth and Oropharynx as per pre-operative assessment

## 2022-10-16 NOTE — Discharge Instructions (Signed)
##############################################  ANORECTAL SURGERY:  POST OPERATIVE INSTRUCTIONS  ######################################################################  EAT Start with a pureed / full liquid diet After 24 hours, gradually transition to a high fiber diet.    CONTROL PAIN Control pain so you can tolerate bowel movements,  walk, sleep, tolerate sneezing/coughing, and go up/down stairs.   HAVE A BOWEL MOVEMENT DAILY Keep your bowels regular to avoid problems.   Taking a fiber supplement every day to keep bowels soft.   Try a laxative to override constipation. Use an antidairrheal to slow down diarrhea.   Call if not better after 2 tries  WALK Walk an hour a day.  Control your pain to do that.   CALL IF YOU HAVE PROBLEMS/CONCERNS Call if you are still struggling despite following these instructions. Call if you have concerns not answered by these instructions  ######################################################################    Take your usually prescribed home medications unless otherwise directed.  DIET: Follow a light bland diet & liquids the first 24 hours after arrival home, such as soup, liquids, starches, etc.  Be sure to drink plenty of fluids.  Quickly advance to a usual solid diet within a few days.  Avoid fast food or heavy meals as your are more likely to get nauseated or have irregular bowels.  A low-fat, high-fiber diet for the rest of your life is ideal.  PAIN CONTROL: Expect swelling and discomfort in the anus/rectal area. Pain is best controlled by a usual combination of many methods TOGETHER: Warm baths/soaks or Ice packs Over the counter pain medication Prescription pain medications Topical creams    Warm water baths or ice packs (30-60 minutes up to 8 times a day, especially after bowel meovements) will help. Use ice for the first few days to help decrease swelling and bruising, then switch to heat such as warm towels, sitz baths, warm  baths, warm showers, etc to help relax tight/sore spots and speed recovery.  Some people prefer to use ice alone, heat alone, alternating between ice & heat.  Experiment to what works for you.    It is helpful to take an over-the-counter pain medication continuously for the first few weeks.  Choose one of the following that works best for you: Naproxen (Aleve, etc)  Two 252m tabs twice a day Ibuprofen (Advil, etc) Three 2070mtabs four times a day (every meal & bedtime) Acetaminophen (Tylenol, etc) 500-65044mour times a day (every meal & bedtime)  A  prescription for pain medication (such as oxycodone, hydrocodone, etc) should be given to you upon discharge.  Take your pain medication as prescribed.  If you are having problems/concerns with the prescription medicine (does not control pain, nausea, vomiting, rash, itching, etc), please call us [phone] see if we need to switch you to a different pain medicine that will work better for you and/or control your side effect better. If you need a refill on your pain medication, please contact your pharmacy.  They will contact our office to request authorization. Prescriptions will not be filled after 5 pm or on week-ends.  If can take up to 48 hours for it to be filled & ready so avoid waiting until you are down to thel ast pill.  A topical cream (Dibucaine) or a prescription for a cream (such as diltiazem 2% gel) may be given to you.  Many people find relief with topical creams.  Some people find it burns too much.  Experiment.  If it helps, use it.  If it burns, don't  using it.  You also may receive a prescription for diazepam, a muscle relaxant to help you to be able to urinate and defecate more easily.  It is safe to take a few doses with the other medications as long as you are not planning to drive or do anything intense.  Hopefully this can minimize the chance of needing a Foley catheter into your bladder     KEEP YOUR BOWELS  REGULAR The goal is one soft bowel movement a day Avoid getting constipated.  Between the surgery and the pain medications, it is common to experience some constipation.  Increasing fluid intake and taking a fiber supplement (such as Metamucil, Citrucel, FiberCon, MiraLax, etc) 2-4 times a day regularly will usually help prevent this problem from occurring.  A mild laxative (prune juice, Milk of Magnesia, MiraLax, etc) should be taken according to package directions if there are no bowel movements after 48 hours. Watch out for diarrhea.  If you have many loose bowel movements, simplify your diet to bland foods & liquids for a few days.  Stop any stool softeners and decrease your fiber supplement.  Switching to mild anti-diarrheal medications (Kayopectate, Pepto Bismol) can help.  Can try an imodium/loperamide dose.  If this worsens or does not improve, please call us.  Wound Care   a. You have some fluffed gauze on top of the anus to help catch drainage and bleeding.  THERE IS NO PACKING INSIDE THE RECTUM -Let the gauze fall off with the first bowel movement or shower.  It is okay to reinforce or replace as needed.  Bleeding is common at first and occasionally tapers off   b. Place soft cotton balls on the anus/wounds and use an absorbent pad in your underwear as needed to catch any drainage and help keep the area.  Try to use cotton balls or pads over regular gauze or toilet paper as gauze will stick and pull, causing pain.  Cotton will come off more easily.   c. Keep the area clean and dry.  Bathe / shower every day.  Keep the area clean by showering / bathing over the incision / wound.   It is okay to soak an open wound to help wash it.  Consider using a squeeze bottle filled with warm water to gently wash the anal area.  Wet wipes or showers / gentle washing after bowel movements is often less traumatic than regular toilet paper.  Use a Sitz Bath 4-8 times a day for relief  A sitz bath is a warm  water bath taken in the sitting position that covers only the hips and buttocks. It may be used for either healing or hygiene purposes. Sitz baths are also used to relieve pain, itching, or muscle spasms.  Gently cleaned the area and the heat will help lower spasm and offer better pain control.    Fill the bathtub half full with warm water. Sit in the water and open the drain a little. Turn on the warm water to keep the tub half full. Keep the water running constantly. Soak in the water for 15 to 20 minutes. After the sitz bath, pat the affected area dry first.   d. You will often notice bleeding, especially with bowel movements.  This should slow down by the end of the first week of surgery.  Sitting on an ice pack can help.   e. Expect some drainage.  You often will have some blood or yellow drainage with open wounds.  Sometimes she will get a little leaking of liquid stool until the incision/wounds have fully close down.  This should slow down by the end of the first week of surgery, but you will have occasional bleeding or drainage up to a few months after surgery.  Wear an absorbent pad or soft cotton gauze in your underwear until the drainage stops.  ACTIVITIES as tolerated:    You may resume regular (light) daily activities beginning the next day--such as daily self-care, walking, climbing stairs--gradually increasing activities as tolerated.  If you can walk 30 minutes without difficulty, it is safe to try more intense activity such as jogging, treadmill, bicycling, low-impact aerobics, swimming, etc. Save the most intensive and strenuous activity for last such as sit-ups, heavy lifting, contact sports, etc  Refrain from any heavy lifting or straining until you are off narcotics for pain control.   DO NOT PUSH THROUGH PAIN.  Let pain be your guide: If it hurts to do something, don't do it.  Pain is your body warning you to avoid that activity for another week until the pain goes down. You  may drive when you are no longer taking prescription pain medication, you can comfortably sit for long periods of time, and you can safely maneuver your car and apply brakes. You may have sexual intercourse when it is comfortable.   FOLLOW UP in our office Please call [facility] at [phone] to set up an appointment to see your surgeon in the office for a follow-up appointment approximately 3 weeks after your surgery. Make sure that you call for this appointment the day you arrive home to ensure a convenient appointment time.  8. IF YOU HAVE DISABILITY OR FAMILY LEAVE FORMS, BRING THEM TO THE OFFICE FOR PROCESSING.  DO NOT GIVE THEM TO YOUR DOCTOR.        WHEN TO CALL us [phone]: Poor pain control Reactions / problems with new medications (rash/itching, nausea, etc)  Fever over 101.5 F (38.5 C) Inability to urinate Nausea and/or vomiting Worsening swelling or bruising Continued bleeding from incision. Increased pain, redness, or drainage from the incision  The clinic staff is available to answer your questions during regular business hours (8:30am-5pm).  Please don't hesitate to call and ask to speak to one of our nurses for clinical concerns.   A surgeon from [facility] is always on call at the hospitals   If you have a medical emergency, go to the nearest emergency room or call 911.    [facility], [address], [place], [place], [place]  [address] ? MAIN: [phone] ? TOLL FREE: [phone] ? FAX [phone] [email or web address]  ####################################################

## 2022-10-16 NOTE — Transfer of Care (Signed)
Immediate Anesthesia Transfer of Care Note  Patient: Kenneth Mcdowell  Procedure(s) Performed: Celedonio Miyamoto PARTIAL PROCTECTOMY OF RECTAL MASS (Rectum)  Patient Location: PACU  Anesthesia Type:General  Level of Consciousness: awake, drowsy, and responds to stimulation  Airway & Oxygen Therapy: Patient Spontanous Breathing and Patient connected to face mask oxygen  Post-op Assessment: Report given to RN, Post -op Vital signs reviewed and stable, and B/P 142/97  Post vital signs: Reviewed and stable  Last Vitals:  Vitals Value Taken Time  BP    Temp    Pulse 85 10/16/22 1412  Resp 18 10/16/22 1412  SpO2 100 % 10/16/22 1412  Vitals shown include unvalidated device data.  Last Pain:  Vitals:   10/16/22 1130  TempSrc: Oral  PainSc:          Complications: No notable events documented.

## 2022-10-16 NOTE — Anesthesia Preprocedure Evaluation (Addendum)
Anesthesia Evaluation  Patient identified by MRN, date of birth, ID band Patient awake    Reviewed: Allergy & Precautions, NPO status , Patient's Chart, lab work & pertinent test results  Airway Mallampati: II  TM Distance: >3 FB Neck ROM: Full    Dental  (+) Edentulous Upper, Edentulous Lower, Dental Advisory Given   Pulmonary asthma , COPD,  COPD inhaler, former smoker   Pulmonary exam normal breath sounds clear to auscultation       Cardiovascular hypertension, Normal cardiovascular exam+ dysrhythmias Atrial Fibrillation  Rhythm:Regular Rate:Normal  TTE 2018 - Left ventricle: The cavity size was normal. Wall thickness was    normal. Systolic function was normal. The estimated ejection    fraction was in the range of 60% to 65%. Wall motion was normal;    there were no regional wall motion abnormalities. The study is    not technically sufficient to allow evaluation of LV diastolic    function.  - Aortic valve: Trileaflet; mildly thickened, mildly calcified    leaflets.     Neuro/Psych  Headaches PSYCHIATRIC DISORDERS   Bipolar Disorder      GI/Hepatic ,GERD  ,,(+)     substance abuse  cocaine use  Endo/Other  negative endocrine ROS    Renal/GU negative Renal ROS  negative genitourinary   Musculoskeletal negative musculoskeletal ROS (+)  narcotic dependent  Abdominal   Peds  (+) ADHD Hematology  (+) Blood dyscrasia, anemia Lab Results      Component                Value               Date                      WBC                      4.9                 10/16/2022                HGB                      6.8 (LL)            10/16/2022                HCT                      21.3 (L)            10/16/2022                MCV                      91.4                10/16/2022                PLT                      266                 10/16/2022              Anesthesia Other Findings    Reproductive/Obstetrics  Anesthesia Physical Anesthesia Plan  ASA: 3 and emergent  Anesthesia Plan: General   Post-op Pain Management: Tylenol PO (pre-op)*   Induction: Intravenous and Rapid sequence  PONV Risk Score and Plan: 2 and Midazolam, Dexamethasone and Ondansetron  Airway Management Planned: Oral ETT  Additional Equipment:   Intra-op Plan:   Post-operative Plan: Extubation in OR  Informed Consent: I have reviewed the patients History and Physical, chart, labs and discussed the procedure including the risks, benefits and alternatives for the proposed anesthesia with the patient or authorized representative who has indicated his/her understanding and acceptance.     Dental advisory given  Plan Discussed with: CRNA  Anesthesia Plan Comments:        Anesthesia Quick Evaluation

## 2022-10-16 NOTE — H&P (View-Only) (Signed)
Kenneth Mcdowell [id number] 1966-09-21  CARE TEAM:  PCP: [name], MD  Outpatient Care Team: Patient Care Team: [name], MD as PCP - General (Pulmonary Disease) Charlott Rakes, MD (Family Medicine) [name], MD as Consulting Physician (Gastroenterology)  Inpatient Treatment Team: Treatment Team: Attending Provider: [name], Md, MD; Consulting Physician: [name], MD; Consulting Physician: [name], Md, MD; Utilization Review: [name], RN; Technician: [name]; Registered Nurse: [name], RN   Problem List:   Principal Problem:   Prolapsed rectal polyp with bleeding Active Problems:   Acute lower GI bleeding   Adenomatous polyp of rectum   GERD (gastroesophageal reflux disease)   Bipolar disorder ([name])   ADHD   History of multiple trauma   Adjustment disorder with mixed anxiety and depressed mood   Tobacco chew use   Chronic right shoulder pain   History of cocaine use   Day of Surgery    Assessment  Chronically prolapsed large adenomatous rectal polyp with due to on chronic bleeding.  [facility] Stay = 1 days)  Plan:  -Hemoglobin is dropped down rather borderline.  I recommended being transfused unit of blood.  Discussed with anesthesia who agrees.  Discussed with ER department.  RN is working with blood bank to try and get it done urgently so we can get him transfused and surgery later this morning.  The patient could have gotten bowel prep to get cleaned out a little bit as well as get some topical agents to help cover soothe and protect the bleeding.  He is refused all this according to nursing.  OR later this morning.  Anorectal examination under esthesia with probably TEM partial proctectomy of chronically bleeding prolapsed rectal mass.  Discussed with the patient.  Discussed with the ER nurse.  Discussed with the anesthesia team.  Discussed with the OR team.  The anatomy & physiology of the anorectal  region was discussed.  The pathophysiology of anorectal & rectal masses, polyps, hemorrhoids and differential diagnosis was discussed.  Natural history risks without surgery was discussed.   I stressed the importance of a bowel regimen to have daily soft bowel movements to minimize progression of disease.    I recommended transanal excision of the concerning region.  Open, TEM (transanal endoscopic microsurgery through rigid large anoscope), robotic transanal MIS / TAMIS approachess discussed.  Risk benefits alternatives discussed.  Risks such as bleeding, infection, urinary difficulties, injury to other organs, need for repair of tissues / organs, need for further treatment, heart attack, death, and other risks were discussed.   Risk for sphincter injury incontinence and irregular bowel movements noted.  Need for pelvic floor physical therapy in the future.  Possible local recurrence or repeated surgery.  Possible need for larger low anterior or abdominal perineal resection  needed.  I noted a good likelihood this will help address the problem.  Goals of post-operative recovery were discussed as well.  Possibility that this will not correct all symptoms was explained.  Post-operative pain, bleeding, constipation, fecal incontinence, and other problems after surgery were discussed.  We will work to minimize complications.  Questions were answered.  The patient expresses understanding & wishes to proceed with surgery.    Would like to get urine drug test to make sure there are no other surprises.  He has refused that so far.  Discussed with anesthesia.  It is an urgent case and may not change things.  Might need to get it once he  gets a Foley catheter once he is asleep.  Follow his postoperative course.  May need to have internal medicine involved if he has other issues.  History of some COPD given history of penetrating chest trauma and prior smoking.  Stable on inhalers.  No evidence of hypoxia at this  time.  Hypertension most likely related to pain and discomfort and anxiety.  As needed medications as needed but try and hold off on being too aggressive since he is having bleeding.  VTE prophylaxis- SCDs.  Hold off on prophylaxis given the fact that he has active GI bleeding.  Mobilize as tolerated to help recovery  Disposition:  Disposition:  The patient is from: Home  Anticipate discharge to:  Home  Anticipated Date of Discharge is:  December 7,2023    Barriers to discharge:  Need for inpatient procedure/study and Pending Clinical improvement (more likely than not)  Patient currently is NOT MEDICALLY STABLE for discharge from the hospital from a surgery standpoint.      I reviewed nursing notes, ED provider notes, last 24 h vitals and pain scores, last 48 h intake and output, last 24 h labs and trends, and last 24 h imaging results. I have reviewed this patient's available data, including medical history, events of note, test results, etc as part of my evaluation.  A significant portion of that time was spent in counseling.  Care during the described time interval was provided by me.  This care required moderate level of medical decision making.  10/16/2022    Subjective: (Chief complaint)  Not able to get the patient direct admitted.  Therefore sent to the ER.  Sleeping in ER room.  He had refused bowel prep and topical agents.  Nursing just outside room trying to work with him.  Having some oozing but not had to change the dressing overnight.  Pain is not too bad.  He has been able to urinate.  Objective:  Vital signs:  Vitals:   10/16/22 0003 10/16/22 0400 10/16/22 0442 10/16/22 0515  BP:  117/76  135/84  Pulse:  100  97  Resp:  18  15  Temp: 98.3 F (36.8 C)  98.8 F (37.1 C)   TempSrc: Oral  Oral   SpO2:  94%  98%    Last BM Date : 10/15/22  Intake/Output   Yesterday:  No intake/output data recorded. This shift:  No intake/output data  recorded.  Bowel function:  Flatus: YES  BM:  No  Drain: (No drain)   Physical Exam:  General: Pt awakens to be alert in mild acute distress Eyes: PERRL, normal EOM.  Sclera clear.  No icterus Neuro: CN II-XII intact w/o focal sensory/motor deficits. Lymph: No head/neck/groin lymphadenopathy Psych:  No delerium/psychosis/paranoia.  Oriented x 4 HENT: Normocephalic, Mucus membranes moist.  No thrush Neck: Supple, No tracheal deviation.  No obvious thyromegaly Chest: No pain to chest wall compression.  Good respiratory excursion.  No audible wheezing CV:  Pulses intact.  Regular rhythm.  No major extremity edema MS: Normal AROM mjr joints.  No obvious deformity Abdomen: Soft.  Nondistended.  Mildly tender at incisions only.  No evidence of peritonitis.  No incarcerated hernias.  Patient has diaper with old blood within it. 's physical exam this morning.  Ext:   No deformity.  No mjr edema.  No cyanosis Skin: No petechiae / purpurea.  No major sores.  Warm and dry    Results:   Cultures: No results found for this or  any previous visit (from the past 720 hour(s)).  Labs: Results for orders placed or performed during the hospital encounter of 10/15/22 (from the past 48 hour(s))  CBC with Differential     Status: Abnormal   Collection Time: 10/15/22  4:23 PM  Result Value Ref Range   WBC 7.2 4.0 - 10.5 K/uL   RBC 2.60 (L) 4.22 - 5.81 MIL/uL   Hemoglobin 7.5 (L) 13.0 - 17.0 g/dL   HCT 23.6 (L) 39.0 - 52.0 %   MCV 90.8 80.0 - 100.0 fL   MCH 28.8 26.0 - 34.0 pg   MCHC 31.8 30.0 - 36.0 g/dL   RDW 12.6 11.5 - 15.5 %   Platelets 300 150 - 400 K/uL   nRBC 0.0 0.0 - 0.2 %   Neutrophils Relative % 72 %   Neutro Abs 5.1 1.7 - 7.7 K/uL   Lymphocytes Relative 17 %   Lymphs Abs 1.3 0.7 - 4.0 K/uL   Monocytes Relative 10 %   Monocytes Absolute 0.7 0.1 - 1.0 K/uL   Eosinophils Relative 1 %   Eosinophils Absolute 0.1 0.0 - 0.5 K/uL   Basophils Relative 0 %   Basophils Absolute  0.0 0.0 - 0.1 K/uL   Immature Granulocytes 0 %   Abs Immature Granulocytes 0.02 0.00 - 0.07 K/uL    Comment: Performed at [facility], [place] [address] [place], [place] [address]  Basic metabolic panel     Status: Abnormal   Collection Time: 10/15/22  4:23 PM  Result Value Ref Range   Sodium 136 135 - 145 mmol/L   Potassium 3.8 3.5 - 5.1 mmol/L   Chloride 105 98 - 111 mmol/L   CO2 23 22 - 32 mmol/L   Glucose, Bld 104 (H) 70 - 99 mg/dL    Comment: Glucose reference range applies only to samples taken after fasting for at least 8 hours.   BUN 10 6 - 20 mg/dL   Creatinine, Ser 0.75 0.61 - 1.24 mg/dL   Calcium 8.6 (L) 8.9 - 10.3 mg/dL   GFR, Estimated >60 >60 mL/min    Comment: (NOTE) Calculated using the CKD-EPI Creatinine Equation (2021)    Anion gap 8 5 - 15    Comment: Performed at [facility], [place] [address] [place], [place] [address]  Type and screen [place]     Status: None   Collection Time: 10/15/22  5:45 PM  Result Value Ref Range   ABO/RH(D) A POS    Antibody Screen NEG    Sample Expiration      10/18/2022,2359 Performed at [facility], [place] [address], [place], [place] [address]   Protime-INR     Status: None   Collection Time: 10/16/22 12:12 AM  Result Value Ref Range   Prothrombin Time 14.3 11.4 - 15.2 seconds   INR 1.1 0.8 - 1.2    Comment: (NOTE) INR goal varies based on device and disease states. Performed at [facility], [place] [address], [place], [place] [address]   Hemoglobin     Status: Abnormal   Collection Time: 10/16/22 12:12 AM  Result Value Ref Range   Hemoglobin 7.1 (L) 13.0 - 17.0 g/dL    Comment: Performed at [facility], [place] [address] [place], [place] [address]  Prepare RBC (crossmatch)     Status: None   Collection Time: 10/16/22  7:29 AM  Result Value Ref Range   Order Confirmation  ORDER PROCESSED BY BLOOD  BANK Performed at [facility], [place] [address] [place], [place] [address]     Imaging / Studies: No results found.  Medications / Allergies: per chart  Antibiotics: Anti-infectives (From admission, onward)    Start     Dose/Rate Route Frequency Ordered Stop   10/16/22 1300  metroNIDAZOLE (FLAGYL) tablet 1,000 mg  Status:  Discontinued       Note to Pharmacy: Take 2 pills (='1000mg'$ ) by mouth at 1pm, 3pm, and 10pm the day before your colorectal operation   1,000 mg Oral 3 times per day on Tue 10/16/22 0718 10/16/22 0724   10/16/22 1300  neomycin (MYCIFRADIN) tablet 1,000 mg  Status:  Discontinued        1,000 mg Oral 3 times per day on Tue 10/16/22 0718 10/16/22 0724   10/16/22 1200  cefoTEtan (CEFOTAN) 2 g in sodium chloride 0.9 % 100 mL IVPB        2 g 200 mL/hr over 30 Minutes Intravenous On call to O.R. 10/15/22 1830 10/17/22 0559   10/16/22 0200  neomycin (MYCIFRADIN) tablet 1,000 mg       See Hyperspace for full Linked Orders Report.   1,000 mg Oral 3 times per day 10/15/22 1830 10/17/22 0159   10/16/22 0200  metroNIDAZOLE (FLAGYL) tablet 1,000 mg       See Hyperspace for full Linked Orders Report.   1,000 mg Oral 3 times per day 10/15/22 1830 10/17/22 0159         Note: Portions of this report may have been transcribed using voice recognition software. Every effort was made to ensure accuracy; however, inadvertent computerized transcription errors may be present.   Any transcriptional errors that result from this process are unintentional.    Adin Hector, MD, FACS, MASCRS Esophageal, Gastrointestinal & Colorectal Surgery Robotic and Minimally Invasive Surgery  [facility]. [address] [place], [place] [address] [phone] Fax [phone] Main  CONTACT INFORMATION:  Weekday (9AM-5PM): Call [facility] main office at [phone]  Weeknight (5PM-9AM) or Weekend/Holiday: Check  [email or web address] (password " [email or web address]") for General Surgery [facility] coverage  (Please, do not use SecureChat as it is not reliable communication to reach operating surgeons for immediate patient care given surgeries/outpatient duties/clinic/cross-coverage/off post-call which would lead to a delay in care.  Epic staff messaging available for outptient concerns, but may not be answered for 48 hours or more).     10/16/2022  8:36 AM

## 2022-10-16 NOTE — Progress Notes (Signed)
Tried to direct admit him but apparently administration says there were no beds.  Patient still with heavy bleeding from his bulky adenomatous rectal polyp that is chronically prolapsed out and not reducible..  We recommend he go to the emergency department.  Patient went to preop short stay instead.  They called his.  Recommended he get redirected to the ER.  He needs blood work to see if he needs transfusion preop and make sure there are no other issues going on.  Urine drug stick, EKG, etc.
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
## 2022-10-17 ENCOUNTER — Encounter (HOSPITAL_COMMUNITY): Payer: Self-pay | Admitting: Surgery

## 2022-10-17 DIAGNOSIS — K642 Third degree hemorrhoids: Secondary | ICD-10-CM | POA: Insufficient documentation

## 2022-10-17 LAB — URINE CULTURE: Culture: NO GROWTH

## 2022-10-17 LAB — CBC
HCT: 33.4 % — ABNORMAL LOW (ref 39.0–52.0)
Hemoglobin: 10.9 g/dL — ABNORMAL LOW (ref 13.0–17.0)
MCH: 28.6 pg (ref 26.0–34.0)
MCHC: 32.6 g/dL (ref 30.0–36.0)
MCV: 87.7 fL (ref 80.0–100.0)
Platelets: 276 10*3/uL (ref 150–400)
RBC: 3.81 MIL/uL — ABNORMAL LOW (ref 4.22–5.81)
RDW: 14.7 % (ref 11.5–15.5)
WBC: 12.9 10*3/uL — ABNORMAL HIGH (ref 4.0–10.5)
nRBC: 0 % (ref 0.0–0.2)

## 2022-10-17 LAB — POTASSIUM: Potassium: 4 mmol/L (ref 3.5–5.1)

## 2022-10-17 LAB — CREATININE, SERUM
Creatinine, Ser: 0.78 mg/dL (ref 0.61–1.24)
GFR, Estimated: >60 mL/min (ref >60)

## 2022-10-17 MED ORDER — METHOCARBAMOL 500 MG PO TABS
500.0000 mg | ORAL_TABLET | Freq: Four times a day (QID) | ORAL | Status: DC
  Administered 2022-10-17: 500 mg via ORAL
  Filled 2022-10-17: qty 1

## 2022-10-17 MED ORDER — TAB-A-VITE/IRON PO TABS
1.0000 | ORAL_TABLET | Freq: Every day | ORAL | Status: DC
  Filled 2022-10-17: qty 1

## 2022-10-17 MED ORDER — GABAPENTIN 300 MG PO CAPS: 300.0000 mg | ORAL_CAPSULE | Freq: Four times a day (QID) | ORAL | 2 refills | Status: DC

## 2022-10-17 MED ORDER — CHLORHEXIDINE GLUCONATE CLOTH 2 % EX PADS: 6.0000 | MEDICATED_PAD | Freq: Every day | CUTANEOUS | Status: DC

## 2022-10-17 MED ORDER — PANTOPRAZOLE SODIUM 40 MG PO TBEC
40.0000 mg | DELAYED_RELEASE_TABLET | Freq: Every day | ORAL | Status: DC
  Administered 2022-10-17: 40 mg via ORAL
  Filled 2022-10-17: qty 1

## 2022-10-17 MED ORDER — OXYCODONE HCL 5 MG PO TABS: 5.0000 mg | ORAL_TABLET | ORAL | Status: DC | PRN

## 2022-10-17 MED ORDER — METHOCARBAMOL 1000 MG/10ML IJ SOLN
500.0000 mg | Freq: Four times a day (QID) | INTRAVENOUS | Status: DC | PRN
  Filled 2022-10-17: qty 5

## 2022-10-17 NOTE — Progress Notes (Signed)
Patient called this RN to his room requested for IVs to be removed, he stated, "I have already urinated and had two bowel movements, Im leaving, you can take out my IVs or I will take them out and walk out"  Patient signed AMA paper, and IVs removed.

## 2022-10-17 NOTE — Progress Notes (Addendum)
Kenneth Mcdowell [id number] January 29, 1966  CARE TEAM:  PCP: [name], MD  Outpatient Care Team: Patient Care Team: [name], MD as PCP - General (Pulmonary Disease) Charlott Rakes, MD (Family Medicine) [name], MD as Consulting Physician (Gastroenterology) [name], MD as Consulting Physician (General Surgery)  Inpatient Treatment Team: Treatment Team: Attending Provider: Nolon Nations, MD; Consulting Physician: [name], MD; Consulting Physician: [name], Md, MD; Registered Nurse: Roselind Rily, RN; Technician: Jasper Riling, NT; Charge Nurse: Steward Ros, RN; Utilization Review: [name], RN; Pharmacist: Lenis Noon, [facility]   Problem List:   Principal Problem:   Prolapsed rectal polyp with bleeding Active Problems:   Acute lower GI bleeding   Adenomatous polyp of rectum   GERD (gastroesophageal reflux disease)   Bipolar disorder ([place])   ADHD   History of multiple trauma   Adjustment disorder with mixed anxiety and depressed mood   Tobacco chew use   Chronic right shoulder pain   History of cocaine use   1 Day Post-Op    10/16/2022  POST-OPERATIVE DIAGNOSIS:   ADENOMATOUS RECTAL POLYP PROLAPSED WITH BLEEDING GRADE 3 PROLAPSING INTERNAL HEMORRHOIDS WITH BLEEDING   PROCEDURE:   PARTIAL PROCTECTOMY OF RECTAL MASS Internal hemorrhoidectomy x1 External hemorrhoidectomy x1 (en bloc) Internal hemorrhoidal ligation and pexy Fecal disimpaction Anorectal examination under anesthesia   SURGEON:  Adin Hector, MD  OR FINDINGS:  Chronically prolapsed distal rectal polyp right anterior very friable and bleeding.  Partial proctectomy done through transanal approach with cautery and harmonic.  Chronically irritated external hemorrhoid right anterior excised en bloc.  The resulting mass was 5x5 cm in size   Pin placement on pathology specimen: Proximal margin: Pink Distal margin:Black Right lateral: White Left  anterior:  Yellow   The closure rests 0-5 cm from the anal verge in the right anterior location.  Longitudinal closure done   Patient with persistently inflamed left lateral internal hemorrhoid partially prolapsed out.  Ligation pexy and hemorrhoidectomy done  Assessment  Stabilizing  [facility] Stay = 2 days)  Plan:  -Follow-up on pathology  -Improved pain control.  Increase gabapentin.  Add methocarbanol.  Continue sitz baths to help.  Oxycodone as needed.  Keep interval Dilaudid increase.  Scheduled sitz bath's.  Topical Analpram as needed.  -Stop IV fluids as previously ordered -remove Foley catheter as previously ordered. -Advance to solid diet. -Anxiolysis. -h/o chest traumam with COPD - stable inhalers - follow -History of polysubstance abuse with benzodiazepines and cocaine.  Claims abstinence and urine drug seeing is confirming of that.  Low-dose THC & tobacco.  Follow  -keep from smoking.  Nicotine patch for now Question of some bipolar and ADHD.  Not on any active medications at the time as far as I am aware.  Mental status stable.  Follow. -GERD - PPI & Maalox PRN. -Father passed away the day before he was admitted.  See if spiritual care can be of some help to him. -VTE prophylaxis- SCDs, etc -mobilize as tolerated to help recovery  Disposition:  Disposition:  The patient is from: Home  Anticipate discharge to:  Home  Anticipated Date of Discharge is:  December 6,2023    Barriers to discharge:  Pending Clinical improvement (more likely than not)  Patient currently is close to being stable for discharge from the hospital from a surgery standpoint.      I reviewed nursing notes, ED provider notes, last 24 h vitals and pain scores, last  48 h intake and output, last 24 h labs and trends, and last 24 h imaging results. I have reviewed this patient's available data, including medical history, events of note, test results, etc as part of my evaluation.  A  significant portion of that time was spent in counseling.  Care during the described time interval was provided by me.  This care required moderate level of medical decision making.  10/17/2022    Subjective: (Chief complaint)  Patient on floor.  Having severe pain needing IV narcotics.  Tolerating liquids.  Wanted to advance diet.  Nursing just outside room.  Objective:  Vital signs:  Vitals:   10/16/22 1945 10/16/22 2102 10/17/22 0139 10/17/22 0552  BP: 135/86 122/74 124/78 (!) 144/108  Pulse: 93 91 87 78  Resp: 18     Temp: 99 F (37.2 C) 98.6 F (37 C) 99.1 F (37.3 C) 97.7 F (36.5 C)  TempSrc: Oral Oral Oral Oral  SpO2: 97% 97% 100% 96%    Last BM Date : 10/16/22  Intake/Output   Yesterday:  12/05 0701 - 12/06 0700 In: 3343.2 [P.O.:600; I.V.:1771.6; Blood:871.7; IV Piggyback:100] Out: 2825 [Urine:2800; Blood:25] This shift:  No intake/output data recorded.  Bowel function:  Flatus: YES  BM:  YES  Drain: (No drain)   Physical Exam:  General: Pt awake/alert in mild acute distress - improved Eyes: PERRL, normal EOM.  Sclera clear.  No icterus Neuro: CN II-XII intact w/o focal sensory/motor deficits. Lymph: No head/neck/groin lymphadenopathy Psych:  No delerium/psychosis/paranoia.  Oriented x 4 HENT: Normocephalic, Mucus membranes moist.  No thrush Neck: Supple, No tracheal deviation.  No obvious thyromegaly Chest: No pain to chest wall compression.  Good respiratory excursion.  No audible wheezing CV:  Pulses intact.  Regular rhythm.  No major extremity edema MS: Normal AROM mjr joints.  No obvious deformity Abdomen: Soft.  Nondistended.  Mildly tender at incisions only.  No evidence of peritonitis.  No incarcerated hernias. GU: Normal external male genitalia.  Foley catheter in place with clear Rectal:  no active bleeding. Ext:   No deformity.  No mjr edema.  No cyanosis Skin: No petechiae / purpurea.  No major sores.  Warm and  dry    Results:   Cultures: No results found for this or any previous visit (from the past 720 hour(s)).  Labs: Results for orders placed or performed during the hospital encounter of 10/15/22 (from the past 48 hour(s))  CBC with Differential     Status: Abnormal   Collection Time: 10/15/22  4:23 PM  Result Value Ref Range   WBC 7.2 4.0 - 10.5 K/uL   RBC 2.60 (L) 4.22 - 5.81 MIL/uL   Hemoglobin 7.5 (L) 13.0 - 17.0 g/dL   HCT 23.6 (L) 39.0 - 52.0 %   MCV 90.8 80.0 - 100.0 fL   MCH 28.8 26.0 - 34.0 pg   MCHC 31.8 30.0 - 36.0 g/dL   RDW 12.6 11.5 - 15.5 %   Platelets 300 150 - 400 K/uL   nRBC 0.0 0.0 - 0.2 %   Neutrophils Relative % 72 %   Neutro Abs 5.1 1.7 - 7.7 K/uL   Lymphocytes Relative 17 %   Lymphs Abs 1.3 0.7 - 4.0 K/uL   Monocytes Relative 10 %   Monocytes Absolute 0.7 0.1 - 1.0 K/uL   Eosinophils Relative 1 %   Eosinophils Absolute 0.1 0.0 - 0.5 K/uL   Basophils Relative 0 %   Basophils Absolute 0.0 0.0 - 0.1  K/uL   Immature Granulocytes 0 %   Abs Immature Granulocytes 0.02 0.00 - 0.07 K/uL    Comment: Performed at [facility], [place] [address] [place], [place] [address]  Basic metabolic panel     Status: Abnormal   Collection Time: 10/15/22  4:23 PM  Result Value Ref Range   Sodium 136 135 - 145 mmol/L   Potassium 3.8 3.5 - 5.1 mmol/L   Chloride 105 98 - 111 mmol/L   CO2 23 22 - 32 mmol/L   Glucose, Bld 104 (H) 70 - 99 mg/dL    Comment: Glucose reference range applies only to samples taken after fasting for at least 8 hours.   BUN 10 6 - 20 mg/dL   Creatinine, Ser 0.75 0.61 - 1.24 mg/dL   Calcium 8.6 (L) 8.9 - 10.3 mg/dL   GFR, Estimated >60 >60 mL/min    Comment: (NOTE) Calculated using the CKD-EPI Creatinine Equation (2021)    Anion gap 8 5 - 15    Comment: Performed at [facility], [place] [address], [place], [place] [address]  Type and screen [place]     Status: None (Preliminary result)    Collection Time: 10/15/22  5:45 PM  Result Value Ref Range   ABO/RH(D) A POS    Antibody Screen NEG    Sample Expiration 10/18/2022,2359    Unit Number [id number]    Blood Component Type RED CELLS,LR    Unit division 00    Status of Unit ISSUED    Transfusion Status OK TO TRANSFUSE    Crossmatch Result Compatible    Unit Number [id number]    Blood Component Type RED CELLS,LR    Unit division 00    Status of Unit ISSUED    Transfusion Status OK TO TRANSFUSE    Crossmatch Result Compatible    Unit Number [id number]    Blood Component Type RED CELLS,LR    Unit division 00    Status of Unit ISSUED    Transfusion Status OK TO TRANSFUSE    Crossmatch Result Compatible    Unit Number [id number]    Blood Component Type RED CELLS,LR    Unit division 00    Status of Unit ALLOCATED    Transfusion Status OK TO TRANSFUSE    Crossmatch Result      Compatible Performed at [place] [address] [place], [place] [address]    Unit Number [id number]    Blood Component Type RED CELLS,LR    Unit division 00    Status of Unit ALLOCATED    Transfusion Status OK TO TRANSFUSE    Crossmatch Result Compatible   Protime-INR     Status: None   Collection Time: 10/16/22 12:12 AM  Result Value Ref Range   Prothrombin Time 14.3 11.4 - 15.2 seconds   INR 1.1 0.8 - 1.2    Comment: (NOTE) INR goal varies based on device and disease states. Performed at [facility], [place] [address], [place], [place] [address]   Hemoglobin     Status: Abnormal   Collection Time: 10/16/22 12:12 AM  Result Value Ref Range   Hemoglobin 7.1 (L) 13.0 - 17.0 g/dL    Comment: Performed at [facility], [place] [address] [place], [place] [address]  Prepare RBC (crossmatch)     Status: None   Collection Time: 10/16/22  7:29 AM  Result Value Ref Range   Order Confirmation  ORDER PROCESSED BY BLOOD BANK Performed at [facility], [place] [address] [place], [place] [address]   CBC     Status: Abnormal   Collection Time: 10/16/22  8:03 AM  Result Value Ref Range   WBC 4.9 4.0 - 10.5 K/uL   RBC 2.33 (L) 4.22 - 5.81 MIL/uL   Hemoglobin 6.8 (LL) 13.0 - 17.0 g/dL    Comment: REPEATED TO VERIFY THIS CRITICAL RESULT HAS VERIFIED AND BEEN CALLED TO TAMBLIN,H. RN BY NICOLE MCCOY ON 12 05 2023 AT 0846, AND HAS BEEN READ BACK. CRITICAL RESULT VERIFIED    HCT 21.3 (L) 39.0 - 52.0 %   MCV 91.4 80.0 - 100.0 fL   MCH 29.2 26.0 - 34.0 pg   MCHC 31.9 30.0 - 36.0 g/dL   RDW 12.9 11.5 - 15.5 %   Platelets 266 150 - 400 K/uL   nRBC 0.0 0.0 - 0.2 %    Comment: Performed at [facility], [place] [address] [place], [place] [address]  Rapid urine drug screen (hospital performed)     Status: Abnormal   Collection Time: 10/16/22 10:47 AM  Result Value Ref Range   Opiates NONE DETECTED NONE DETECTED   Cocaine NONE DETECTED NONE DETECTED   Benzodiazepines NONE DETECTED NONE DETECTED   Amphetamines NONE DETECTED NONE DETECTED   Tetrahydrocannabinol POSITIVE (A) NONE DETECTED   Barbiturates NONE DETECTED NONE DETECTED    Comment: (NOTE) DRUG SCREEN FOR MEDICAL PURPOSES ONLY.  IF CONFIRMATION IS NEEDED FOR ANY PURPOSE, NOTIFY LAB WITHIN 5 DAYS.  LOWEST DETECTABLE LIMITS FOR URINE DRUG SCREEN Drug Class                     Cutoff (ng/mL) Amphetamine and metabolites    1000 Barbiturate and metabolites    200 Benzodiazepine                 200 Opiates and metabolites        300 Cocaine and metabolites        300 THC                            50 Performed at [facility], [place] [address], [place],  [address]   POCT I-Stat EG7     Status: Abnormal   Collection Time: 10/16/22  1:33 PM  Result Value Ref Range   pH, Ven 7.290 7.25 - 7.43   pCO2, Ven 48.5 44 - 60 mmHg   pO2, Ven 108 (H) 32 - 45 mmHg   Bicarbonate 23.4 20.0 - 28.0 mmol/L   TCO2 25 22 - 32 mmol/L   O2 Saturation  98 %   Acid-base deficit 3.0 (H) 0.0 - 2.0 mmol/L   Sodium 136 135 - 145 mmol/L   Potassium 4.0 3.5 - 5.1 mmol/L   Calcium, Ion 1.17 1.15 - 1.40 mmol/L   HCT 29.0 (L) 39.0 - 52.0 %   Hemoglobin 9.9 (L) 13.0 - 17.0 g/dL   Patient temperature 36.8 C    Sample type VENOUS   CBC     Status: Abnormal   Collection Time: 10/17/22  4:43 AM  Result Value Ref Range   WBC 12.9 (H) 4.0 - 10.5 K/uL   RBC 3.81 (L) 4.22 - 5.81 MIL/uL   Hemoglobin 10.9 (L) 13.0 - 17.0 g/dL    Comment: REPEATED TO VERIFY POST TRANSFUSION SPECIMEN    HCT 33.4 (L)  39.0 - 52.0 %   MCV 87.7 80.0 - 100.0 fL   MCH 28.6 26.0 - 34.0 pg   MCHC 32.6 30.0 - 36.0 g/dL   RDW 14.7 11.5 - 15.5 %   Platelets 276 150 - 400 K/uL   nRBC 0.0 0.0 - 0.2 %    Comment: Performed at [facility], [place] [address], [place], [place] [address]  Potassium     Status: None   Collection Time: 10/17/22  4:43 AM  Result Value Ref Range   Potassium 4.0 3.5 - 5.1 mmol/L    Comment: Performed at [facility], [place] [address], [place], [place] [address]  Creatinine, serum     Status: None   Collection Time: 10/17/22  4:43 AM  Result Value Ref Range   Creatinine, Ser 0.78 0.61 - 1.24 mg/dL   GFR, Estimated >60 >60 mL/min    Comment: (NOTE) Calculated using the CKD-EPI Creatinine Equation (2021) Performed at [facility], [place] [address], [place], [place] [address]     Imaging / Studies: No results found.  Medications / Allergies: per chart  Antibiotics: Anti-infectives (From admission, onward)    Start     Dose/Rate Route Frequency Ordered Stop   10/16/22 1300  metroNIDAZOLE (FLAGYL) tablet 1,000 mg  Status:  Discontinued       Note to Pharmacy: Take 2 pills (='1000mg'$ ) by mouth at 1pm, 3pm, and 10pm the day before your colorectal operation   1,000 mg Oral 3 times per day on Tue 10/16/22 0718 10/16/22 0724   10/16/22 1300  neomycin (MYCIFRADIN) tablet 1,000 mg  Status:  Discontinued         1,000 mg Oral 3 times per day on Tue 10/16/22 0718 10/16/22 0724   10/16/22 1200  cefoTEtan (CEFOTAN) 2 g in sodium chloride 0.9 % 100 mL IVPB        2 g 200 mL/hr over 30 Minutes Intravenous On call to O.R. 10/15/22 1830 10/16/22 1237   10/16/22 0200  neomycin (MYCIFRADIN) tablet 1,000 mg       See Hyperspace for full Linked Orders Report.   1,000 mg Oral 3 times per day 10/15/22 1830 10/16/22 1035   10/16/22 0200  metroNIDAZOLE (FLAGYL) tablet 1,000 mg       See Hyperspace for full Linked Orders Report.   1,000 mg Oral 3 times per day 10/15/22 1830 10/16/22 1035         Note: Portions of this report may have been transcribed using voice recognition software. Every effort was made to ensure accuracy; however, inadvertent computerized transcription errors may be present.   Any transcriptional errors that result from this process are unintentional.    Adin Hector, MD, FACS, MASCRS Esophageal, Gastrointestinal & Colorectal Surgery Robotic and Minimally Invasive Surgery  [facility]. [address] [place],  [address] [phone] Fax [phone] Main  CONTACT INFORMATION:  Weekday (9AM-5PM): Call [facility] main office at [phone]  Weeknight (5PM-9AM) or Weekend/Holiday: Check [email or web address] (password " [email or web address]") for General Surgery [facility] coverage  (Please, do not use SecureChat as it is not reliable communication to reach operating surgeons for immediate patient care given surgeries/outpatient duties/clinic/cross-coverage/off post-call which would lead to a delay in care.  Epic staff messaging available for outptient concerns, but may not be answered for 48 hours or more).     10/17/2022  7:43 AM

## 2022-10-17 NOTE — Progress Notes (Signed)
  Transition of Care [facility]) Screening Note   Patient Details  Name: Kenneth Mcdowell Date of Birth: 08-Dec-1965   Transition of Care [facility]) CM/SW Contact:    Lennart Pall, LCSW Phone Number: 10/17/2022, 11:05 AM    Transition of Care Department [facility]) has reviewed patient and no TOC needs have been identified at this time. We will continue to monitor patient advancement through interdisciplinary progression rounds. If new patient transition needs arise, please place a TOC consult.

## 2022-10-18 ENCOUNTER — Telehealth: Payer: Self-pay

## 2022-10-18 NOTE — Telephone Encounter (Signed)
Transition Care Management Follow-up Telephone Call Date of discharge and from where: 10/17/2022, [facility]- left AMA How have you been since you were released from the hospital? He said he has a lot of rectal pain and has not picked up his pain medication at the pharmacy yet.  He stated he has been moving his bowels and he has not had any rectal bleeding.  Any questions or concerns? No  Items Reviewed: Did the pt receive and understand the discharge instructions provided?  Left AMA Medications obtained and verified?  He said he has no medications, he said his mother will pick them up for him today.  Other? No  Any new allergies since your discharge? No  Dietary orders reviewed? No Do you have support at home? Yes   Home Care and Equipment/Supplies: Were home health services ordered? no If so, what is the name of the agency? N/a  Has the agency set up a time to come to the patient's home? not applicable Were any new equipment or medical supplies ordered?  No What is the name of the medical supply agency? N/a Were you able to get the supplies/equipment? not applicable Do you have any questions related to the use of the equipment or supplies? No  Functional Questionnaire: (I = Independent and D = Dependent) ADLs: independent  Follow up appointments reviewed:  PCP Hospital f/u appt confirmed? Yes  Scheduled to see Dr Joya Gaskins- 11/13/2021.  [facility] confirmed?  None scheduled at this time  Are transportation arrangements needed? No  If their condition worsens, is the pt aware to call PCP or go to the Emergency Dept.? Yes Was the patient provided with contact information for the PCP's office or ED? Yes Was to pt encouraged to call back with questions or concerns? Yes

## 2022-10-18 NOTE — Discharge Summary (Signed)
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 (5PM-9AM) or Weekend/Holiday: Check [email or web address] (password " [email or web address]") for General Surgery [facility] coverage  (Please, do not use SecureChat as it is not reliable communication to reach operating surgeons for immediate patient care given surgeries/outpatient duties/clinic/cross-coverage/off post-call which would lead to a delay in care.  Epic staff messaging available for outptient  concerns, but may not be answered for 48 hours or more).     10/18/2022, 11:27 AM

## 2022-10-19 LAB — TYPE AND SCREEN
ABO/RH(D): A POS
Antibody Screen: NEGATIVE
Unit division: 0
Unit division: 0
Unit division: 0
Unit division: 0
Unit division: 0

## 2022-10-19 LAB — BPAM RBC
Blood Product Expiration Date: 202312242359
Blood Product Expiration Date: 202312272359
Blood Product Expiration Date: 202312272359
Blood Product Expiration Date: 202312272359
Blood Product Expiration Date: 202312272359
ISSUE DATE / TIME: 202312050855
ISSUE DATE / TIME: 202312051127
ISSUE DATE / TIME: 202312051127
ISSUE DATE / TIME: 202312051131
ISSUE DATE / TIME: 202312051131
Unit Type and Rh: 6200
Unit Type and Rh: 6200
Unit Type and Rh: 6200
Unit Type and Rh: 6200
Unit Type and Rh: 6200

## 2022-10-19 LAB — SURGICAL PATHOLOGY

## 2022-10-24 ENCOUNTER — Encounter: Payer: Self-pay | Admitting: Gastroenterology

## 2022-10-31 DIAGNOSIS — M5136 Other intervertebral disc degeneration, lumbar region: Secondary | ICD-10-CM | POA: Diagnosis not present

## 2022-10-31 DIAGNOSIS — M545 Low back pain, unspecified: Secondary | ICD-10-CM | POA: Diagnosis not present
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
## 2022-11-13 ENCOUNTER — Ambulatory Visit: Payer: Medicaid Other | Admitting: Critical Care Medicine

## 2022-11-26 ENCOUNTER — Other Ambulatory Visit: Payer: Self-pay | Admitting: Critical Care Medicine

## 2022-11-26 DIAGNOSIS — J4489 Other specified chronic obstructive pulmonary disease: Secondary | ICD-10-CM

## 2022-11-27 MED ORDER — ALBUTEROL SULFATE HFA 108 (90 BASE) MCG/ACT IN AERS: 2.0000 | INHALATION_SPRAY | Freq: Four times a day (QID) | RESPIRATORY_TRACT | 0 refills | Status: DC | PRN

## 2023-01-04 ENCOUNTER — Ambulatory Visit (HOSPITAL_BASED_OUTPATIENT_CLINIC_OR_DEPARTMENT_OTHER): Payer: Medicaid Other | Admitting: Orthopaedic Surgery
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
## 2023-01-15 ENCOUNTER — Ambulatory Visit: Payer: Medicaid Other | Admitting: Critical Care Medicine
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
## 2023-04-02 ENCOUNTER — Encounter: Payer: Medicaid Other | Admitting: Critical Care Medicine

## 2023-06-18 ENCOUNTER — Other Ambulatory Visit: Payer: Self-pay | Admitting: Critical Care Medicine

## 2023-06-29 ENCOUNTER — Other Ambulatory Visit: Payer: Self-pay | Admitting: Critical Care Medicine

## 2023-06-29 DIAGNOSIS — J4489 Other specified chronic obstructive pulmonary disease: Secondary | ICD-10-CM
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
## 2023-07-16 ENCOUNTER — Ambulatory Visit: Payer: Medicaid Other | Attending: Critical Care Medicine | Admitting: Critical Care Medicine

## 2023-07-16 ENCOUNTER — Encounter: Payer: Self-pay | Admitting: Critical Care Medicine

## 2023-07-16 VITALS — BP 130/83 | HR 74 | Ht 75.0 in | Wt 177.8 lb

## 2023-07-16 DIAGNOSIS — I10 Essential (primary) hypertension: Secondary | ICD-10-CM

## 2023-07-16 DIAGNOSIS — J4489 Other specified chronic obstructive pulmonary disease: Secondary | ICD-10-CM | POA: Diagnosis not present

## 2023-07-16 DIAGNOSIS — Z720 Tobacco use: Secondary | ICD-10-CM

## 2023-07-16 DIAGNOSIS — F1190 Opioid use, unspecified, uncomplicated: Secondary | ICD-10-CM

## 2023-07-16 DIAGNOSIS — F17290 Nicotine dependence, other tobacco product, uncomplicated: Secondary | ICD-10-CM

## 2023-07-16 DIAGNOSIS — J4551 Severe persistent asthma with (acute) exacerbation: Secondary | ICD-10-CM | POA: Diagnosis not present

## 2023-07-16 MED ORDER — SPIRIVA RESPIMAT 2.5 MCG/ACT IN AERS: INHALATION_SPRAY | RESPIRATORY_TRACT | 11 refills | Status: DC

## 2023-07-16 MED ORDER — AZITHROMYCIN 250 MG PO TABS: ORAL_TABLET | ORAL | 0 refills | Status: DC

## 2023-07-16 MED ORDER — FLUTICASONE-SALMETEROL 115-21 MCG/ACT IN AERO: 2.0000 | INHALATION_SPRAY | Freq: Two times a day (BID) | RESPIRATORY_TRACT | 12 refills | Status: DC

## 2023-07-16 MED ORDER — ALBUTEROL SULFATE HFA 108 (90 BASE) MCG/ACT IN AERS: 2.0000 | INHALATION_SPRAY | Freq: Four times a day (QID) | RESPIRATORY_TRACT | 0 refills | Status: DC | PRN

## 2023-07-16 NOTE — Assessment & Plan Note (Signed)
Hypertension controlled currently not on medicines will observe

## 2023-07-16 NOTE — Assessment & Plan Note (Signed)
Now on Suboxone

## 2023-07-16 NOTE — Assessment & Plan Note (Signed)
Patient given nicotine cessation advice

## 2023-07-16 NOTE — Patient Instructions (Addendum)
Chest xray today Labs today Start spiriva two puff  daily Start Advair inhaler two puff twice daily  Return primary care 5 months

## 2023-07-16 NOTE — Assessment & Plan Note (Signed)
Mild acute exacerbation with acute bronchitis give a course of azithromycin and add Spiriva to the patient's Advair

## 2023-07-17 LAB — COMPREHENSIVE METABOLIC PANEL
ALT: 19 IU/L (ref 0–44)
AST: 20 IU/L (ref 0–40)
Albumin: 4.5 g/dL (ref 3.8–4.9)
Alkaline Phosphatase: 76 IU/L (ref 44–121)
BUN/Creatinine Ratio: 15 (ref 9–20)
BUN: 15 mg/dL (ref 6–24)
Bilirubin Total: 0.3 mg/dL (ref 0.0–1.2)
CO2: 26 mmol/L (ref 20–29)
Calcium: 9.9 mg/dL (ref 8.7–10.2)
Chloride: 98 mmol/L (ref 96–106)
Creatinine, Ser: 1.01 mg/dL (ref 0.76–1.27)
Globulin, Total: 2.3 g/dL (ref 1.5–4.5)
Glucose: 85 mg/dL (ref 70–99)
Potassium: 5 mmol/L (ref 3.5–5.2)
Sodium: 140 mmol/L (ref 134–144)
Total Protein: 6.8 g/dL (ref 6.0–8.5)
eGFR: 87 mL/min/{1.73_m2} (ref >59)

## 2023-07-17 LAB — CBC WITH DIFFERENTIAL/PLATELET
Basophils Absolute: 0 10*3/uL (ref 0.0–0.2)
Basos: 0 %
EOS (ABSOLUTE): 0.2 10*3/uL (ref 0.0–0.4)
Eos: 4 %
Hematocrit: 40.4 % (ref 37.5–51.0)
Hemoglobin: 13 g/dL (ref 13.0–17.7)
Immature Grans (Abs): 0 10*3/uL (ref 0.0–0.1)
Immature Granulocytes: 0 %
Lymphocytes Absolute: 1.2 10*3/uL (ref 0.7–3.1)
Lymphs: 19 %
MCH: 28.4 pg (ref 26.6–33.0)
MCHC: 32.2 g/dL (ref 31.5–35.7)
MCV: 88 fL (ref 79–97)
Monocytes Absolute: 0.6 10*3/uL (ref 0.1–0.9)
Monocytes: 11 %
Neutrophils Absolute: 3.9 10*3/uL (ref 1.4–7.0)
Neutrophils: 66 %
Platelets: 218 10*3/uL (ref 150–450)
RBC: 4.58 x10E6/uL (ref 4.14–5.80)
RDW: 13.8 % (ref 11.6–15.4)
WBC: 5.9 10*3/uL (ref 3.4–10.8)

## 2023-07-17 NOTE — Progress Notes (Signed)
Let the patient know blood counts are normal liver kidneys are normal waiting on chest x-ray

## 2023-07-18 ENCOUNTER — Telehealth: Payer: Self-pay

## 2023-07-18 NOTE — Telephone Encounter (Signed)
Pt was called and is aware of results, DOB was confirmed.  ?

## 2023-07-18 NOTE — Telephone Encounter (Signed)
-----   Message from Shan Levans sent at 07/17/2023  7:48 AM EDT ----- Let the patient know blood counts are normal liver kidneys are normal waiting on chest x-ray

## 2023-10-14 DIAGNOSIS — R9431 Abnormal electrocardiogram [ECG] [EKG]: Secondary | ICD-10-CM | POA: Diagnosis not present

## 2023-10-14 DIAGNOSIS — Z131 Encounter for screening for diabetes mellitus: Secondary | ICD-10-CM | POA: Diagnosis not present

## 2023-10-14 DIAGNOSIS — Z1159 Encounter for screening for other viral diseases: Secondary | ICD-10-CM | POA: Diagnosis not present

## 2023-10-14 DIAGNOSIS — R000 Tachycardia, unspecified: Secondary | ICD-10-CM | POA: Diagnosis not present

## 2023-10-14 DIAGNOSIS — Z125 Encounter for screening for malignant neoplasm of prostate: Secondary | ICD-10-CM | POA: Diagnosis not present

## 2023-10-14 DIAGNOSIS — F319 Bipolar disorder, unspecified: Secondary | ICD-10-CM | POA: Diagnosis not present

## 2023-10-14 DIAGNOSIS — Z13220 Encounter for screening for lipoid disorders: Secondary | ICD-10-CM | POA: Diagnosis not present

## 2023-10-14 DIAGNOSIS — Z79899 Other long term (current) drug therapy: Secondary | ICD-10-CM | POA: Diagnosis not present

## 2023-10-14 DIAGNOSIS — Z114 Encounter for screening for human immunodeficiency virus [HIV]: Secondary | ICD-10-CM | POA: Diagnosis not present

## 2023-10-16 DIAGNOSIS — Z79899 Other long term (current) drug therapy: Secondary | ICD-10-CM | POA: Diagnosis not present

## 2023-10-21 ENCOUNTER — Other Ambulatory Visit: Payer: Self-pay | Admitting: Critical Care Medicine

## 2023-10-21 DIAGNOSIS — J4489 Other specified chronic obstructive pulmonary disease: Secondary | ICD-10-CM

## 2023-10-22 DIAGNOSIS — Z79899 Other long term (current) drug therapy: Secondary | ICD-10-CM | POA: Diagnosis not present

## 2023-10-22 DIAGNOSIS — R9431 Abnormal electrocardiogram [ECG] [EKG]: Secondary | ICD-10-CM | POA: Diagnosis not present

## 2023-10-22 DIAGNOSIS — F319 Bipolar disorder, unspecified: Secondary | ICD-10-CM | POA: Diagnosis not present

## 2023-12-20 ENCOUNTER — Telehealth: Payer: Self-pay

## 2023-12-20 NOTE — Telephone Encounter (Signed)
 Copied from CRM [id number]. Topic: Appointments - Appointment Scheduling >> Dec 20, 2023 10:29 AM Jinnie BROCKS wrote: Patient/patient representative is calling for Transfer of care. His former Dr. Retired (Dr. Brien). He is looking to seek a new provider. Patient also stated that he needs his medication very badly. Patient has new appt. On Feb. 9 @ 8:40am

## 2023-12-20 NOTE — Telephone Encounter (Signed)
 Spoke with patient and got him scheduled with Adina Crandall for an earlier appt to be seen. He stated that he is out of all his inhalers and informed him to contact previous office for refills. Patient stated that he has been out this long and will wait until his appt with [facility].

## 2023-12-23 ENCOUNTER — Ambulatory Visit: Payer: Medicaid Other | Admitting: Internal Medicine

## 2023-12-26 ENCOUNTER — Ambulatory Visit: Payer: Medicaid Other | Admitting: Nurse Practitioner

## 2023-12-26 ENCOUNTER — Encounter: Payer: Self-pay | Admitting: Nurse Practitioner

## 2023-12-26 VITALS — BP 130/84 | HR 86 | Temp 98.2°F | Ht 73.5 in | Wt 164.4 lb

## 2023-12-26 DIAGNOSIS — Z131 Encounter for screening for diabetes mellitus: Secondary | ICD-10-CM | POA: Diagnosis not present

## 2023-12-26 DIAGNOSIS — Z125 Encounter for screening for malignant neoplasm of prostate: Secondary | ICD-10-CM

## 2023-12-26 DIAGNOSIS — Z13220 Encounter for screening for lipoid disorders: Secondary | ICD-10-CM | POA: Diagnosis not present

## 2023-12-26 DIAGNOSIS — Z87828 Personal history of other (healed) physical injury and trauma: Secondary | ICD-10-CM | POA: Diagnosis not present

## 2023-12-26 DIAGNOSIS — Z87891 Personal history of nicotine dependence: Secondary | ICD-10-CM | POA: Diagnosis not present

## 2023-12-26 DIAGNOSIS — I10 Essential (primary) hypertension: Secondary | ICD-10-CM | POA: Diagnosis not present

## 2023-12-26 DIAGNOSIS — J4489 Other specified chronic obstructive pulmonary disease: Secondary | ICD-10-CM

## 2023-12-26 DIAGNOSIS — Z833 Family history of diabetes mellitus: Secondary | ICD-10-CM | POA: Diagnosis not present

## 2023-12-26 LAB — LIPID PANEL
Cholesterol: 166 mg/dL (ref 0–200)
HDL: 60.6 mg/dL (ref >39.00)
LDL Cholesterol: 92 mg/dL (ref 0–99)
NonHDL: 105.05
Total CHOL/HDL Ratio: 3
Triglycerides: 64 mg/dL (ref 0.0–149.0)
VLDL: 12.8 mg/dL (ref 0.0–40.0)

## 2023-12-26 LAB — HEMOGLOBIN A1C: Hgb A1c MFr Bld: 5.8 % (ref 4.6–6.5)

## 2023-12-26 LAB — COMPREHENSIVE METABOLIC PANEL
ALT: 10 U/L (ref 0–53)
AST: 10 U/L (ref 0–37)
Albumin: 4.8 g/dL (ref 3.5–5.2)
Alkaline Phosphatase: 65 U/L (ref 39–117)
BUN: 9 mg/dL (ref 6–23)
CO2: 28 meq/L (ref 19–32)
Calcium: 9.5 mg/dL (ref 8.4–10.5)
Chloride: 104 meq/L (ref 96–112)
Creatinine, Ser: 0.88 mg/dL (ref 0.40–1.50)
GFR: 96.06 mL/min (ref >60.00)
Glucose, Bld: 99 mg/dL (ref 70–99)
Potassium: 4.4 meq/L (ref 3.5–5.1)
Sodium: 140 meq/L (ref 135–145)
Total Bilirubin: 0.7 mg/dL (ref 0.2–1.2)
Total Protein: 7.3 g/dL (ref 6.0–8.3)

## 2023-12-26 LAB — PSA: PSA: 0.67 ng/mL (ref 0.10–4.00)

## 2023-12-26 LAB — CBC
HCT: 48 % (ref 39.0–52.0)
Hemoglobin: 15.8 g/dL (ref 13.0–17.0)
MCHC: 32.9 g/dL (ref 30.0–36.0)
MCV: 89.7 fL (ref 78.0–100.0)
Platelets: 226 10*3/uL (ref 150.0–400.0)
RBC: 5.35 Mil/uL (ref 4.22–5.81)
RDW: 15 % (ref 11.5–15.5)
WBC: 8.1 10*3/uL (ref 4.0–10.5)

## 2023-12-26 LAB — TSH: TSH: 1.13 u[IU]/mL (ref 0.35–5.50)

## 2023-12-26 MED ORDER — SPIRIVA RESPIMAT 2.5 MCG/ACT IN AERS: INHALATION_SPRAY | RESPIRATORY_TRACT | 11 refills | Status: DC

## 2023-12-26 MED ORDER — FLUTICASONE-SALMETEROL 115-21 MCG/ACT IN AERO: 2.0000 | INHALATION_SPRAY | Freq: Two times a day (BID) | RESPIRATORY_TRACT | 12 refills | Status: DC

## 2023-12-26 MED ORDER — ALBUTEROL SULFATE HFA 108 (90 BASE) MCG/ACT IN AERS: 1.0000 | INHALATION_SPRAY | Freq: Four times a day (QID) | RESPIRATORY_TRACT | 0 refills | Status: DC | PRN

## 2023-12-26 NOTE — Assessment & Plan Note (Signed)
Patient on Advair, Spiriva, albuterol as needed.  Patient currently uncontrolled patient medications for extended period time refills provided today
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
## 2023-12-26 NOTE — Patient Instructions (Signed)
Nice to see you today I will be in touch with the labs once I have them Follow up with me in 3 months, sooner if you need me

## 2023-12-26 NOTE — Assessment & Plan Note (Signed)
Pending urine microscopy rule out microscopic hematuria

## 2023-12-26 NOTE — Assessment & Plan Note (Signed)
Patient currently maintained on lifestyle modifications only.  Blood pressure within normal limits.  Stable at this juncture

## 2023-12-31 ENCOUNTER — Encounter: Payer: Self-pay | Admitting: Nurse Practitioner

## 2024-01-28 ENCOUNTER — Encounter: Payer: Medicaid Other | Admitting: General Practice

## 2024-03-18 ENCOUNTER — Encounter (HOSPITAL_COMMUNITY): Payer: Self-pay

## 2024-03-25 ENCOUNTER — Ambulatory Visit: Payer: Medicaid Other | Admitting: Nurse Practitioner

## 2024-03-25 ENCOUNTER — Encounter: Payer: Self-pay | Admitting: Nurse Practitioner

## 2024-03-25 VITALS — BP 140/98 | HR 99 | Temp 98.0°F | Ht 73.5 in | Wt 169.4 lb

## 2024-03-25 DIAGNOSIS — J4489 Other specified chronic obstructive pulmonary disease: Secondary | ICD-10-CM | POA: Diagnosis not present

## 2024-03-25 DIAGNOSIS — F99 Mental disorder, not otherwise specified: Secondary | ICD-10-CM | POA: Diagnosis not present

## 2024-03-25 DIAGNOSIS — F5105 Insomnia due to other mental disorder: Secondary | ICD-10-CM | POA: Insufficient documentation

## 2024-03-25 DIAGNOSIS — I10 Essential (primary) hypertension: Secondary | ICD-10-CM

## 2024-03-25 MED ORDER — PREDNISONE 20 MG PO TABS: 40.0000 mg | ORAL_TABLET | Freq: Every day | ORAL | 0 refills | Status: AC

## 2024-03-25 MED ORDER — ALBUTEROL SULFATE HFA 108 (90 BASE) MCG/ACT IN AERS: 1.0000 | INHALATION_SPRAY | Freq: Four times a day (QID) | RESPIRATORY_TRACT | 1 refills | Status: DC | PRN

## 2024-03-25 NOTE — Assessment & Plan Note (Signed)
 Patient had not picked up Advair  or Spiriva .  He had been using albuterol  inhaler many times a day.  Call pharmacy and especially inhalers ready for patient.  Also do steroid burst of prednisone 

## 2024-03-25 NOTE — Telephone Encounter (Signed)
 Called [facility] and spoke with Fabio Holts, pharmacist.  Seth Daniel message back to [facility] concerning the medications he had questions about.  See mychart message for info.

## 2024-03-25 NOTE — Assessment & Plan Note (Signed)
 Patient has tried many therapies including melatonin, trazodone , Seroquel , Depakote.  Patient is not interested in psychiatry referral.

## 2024-03-25 NOTE — Patient Instructions (Signed)
 Nice to see you today I have refilled the inhalers and sent some prednisone  in  Follow up with me in 3 months, sooner if you need me

## 2024-03-25 NOTE — Assessment & Plan Note (Addendum)
 Patient currently maintained on lifestyle modifications only.  Patient's blood pressure elevated on initial check and recheck.  Will continue monitoring at this juncture

## 2024-03-25 NOTE — Progress Notes (Signed)
 Established Patient Office Visit  Subjective   Patient ID: Kenneth Mcdowell, male    DOB: 1966-07-22  Age: 58 y.o. MRN: [id number]  Chief Complaint  Patient presents with   Follow-up    COPD inhaler recheck. Pt complains of feeling worse. States he is exhausted all the time. States he cannot do his routine activities without having to use the inhaler. Pt requests for prednisone  again.    Medication Management    Pt states that he has not been asleep in 48 hours. States that stress and COPD make it hard.     HPI  COPD: Patient was seen by me On 12/26/2023.  Imaging x-rays maintained on Advair  2 puffs twice daily Spiriva  2 puffs daily he has been out of medications for months when he is seeing me been using Primatene  Mist 5-6 times a day over-the-counter.  We did refill medications and patient is here for follow-up. States that he was not able to get his maintenance inhaler. States that he is usinf the albuterol  frequently. States that he is needing some prednisone .   Stress: stats that he has ha lot of stress at home. His brother had heart surgery and is not doing well. States that his mother is getting the beginnings of demintia   States that he has been having trouble sleeping. He is having trouble turning his mind off. 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 He has tried melatonin in the past and it did not help. He has tried trazodone  and seroqeul and depakote that was not effective. States that he has seen pyschiartry in the past and went through that whole process and "would prefer to be crazy". He is not interested in a referral     Review of Systems  Constitutional:  Negative for chills and fever.  Respiratory:  Positive for shortness of breath.   Cardiovascular:  Negative for chest pain.  Neurological:  Negative for headaches.  Psychiatric/Behavioral:  Negative for hallucinations and suicidal ideas. The patient has insomnia.       Objective:     BP (!) 140/98    Pulse 99   Temp 98 F (36.7 C) (Oral)   Ht 6' 1.5" (1.867 m)   Wt 169 lb 6.4 oz (76.8 kg)   SpO2 96%   BMI 22.05 kg/m    Physical Exam Vitals and nursing note reviewed.  Constitutional:      Appearance: Normal appearance.  Cardiovascular:     Rate and Rhythm: Normal rate and regular rhythm.     Heart sounds: Normal heart sounds.  Pulmonary:     Effort: Pulmonary effort is normal.     Breath sounds: Wheezing (Upper lobes) present.  Neurological:     Mental Status: He is alert.      No results found for any visits on 03/25/24.    The 10-year ASCVD risk score (Arnett DK, et al., 2019) is: 5.8%    Assessment & Plan:   Problem List Items Addressed This Visit       Cardiovascular and Mediastinum   Primary hypertension - Primary   Patient currently maintained on lifestyle modifications only.  Patient's blood pressure elevated on initial check and recheck.  Will continue monitoring at this juncture        Respiratory   COPD with asthma [facility])   Patient had not picked up Advair  or Spiriva .  He had been using albuterol  inhaler many times a day.  Call pharmacy and especially inhalers ready  for patient.  Also do steroid burst of prednisone       Relevant Medications   albuterol  (VENTOLIN  HFA) 108 (90 Base) MCG/ACT inhaler   predniSONE  (DELTASONE ) 20 MG tablet     Other   Insomnia due to other mental disorder   Patient has tried many therapies including melatonin, trazodone , Seroquel , Depakote.  Patient is not interested in psychiatry referral.       Return in about 3 months (around 06/25/2024) for COPD/HTN.    Margarie Shay, NP

## 2024-04-28 ENCOUNTER — Other Ambulatory Visit: Payer: Self-pay | Admitting: Critical Care Medicine

## 2024-06-22 ENCOUNTER — Other Ambulatory Visit: Payer: Self-pay | Admitting: Nurse Practitioner

## 2024-06-22 DIAGNOSIS — J4489 Other specified chronic obstructive pulmonary disease: Secondary | ICD-10-CM

## 2024-07-14 ENCOUNTER — Ambulatory Visit (INDEPENDENT_AMBULATORY_CARE_PROVIDER_SITE_OTHER)
Admission: RE | Admit: 2024-07-14 | Discharge: 2024-07-14 | Disposition: A | Discharge status: Home / Self Care | Source: Ambulatory Visit | Attending: Nurse Practitioner | Admitting: Nurse Practitioner

## 2024-07-14 ENCOUNTER — Ambulatory Visit (INDEPENDENT_AMBULATORY_CARE_PROVIDER_SITE_OTHER): Admitting: Nurse Practitioner

## 2024-07-14 VITALS — BP 120/78 | HR 81 | Temp 97.6°F | Ht 73.5 in | Wt 171.0 lb

## 2024-07-14 DIAGNOSIS — J4551 Severe persistent asthma with (acute) exacerbation: Secondary | ICD-10-CM

## 2024-07-14 DIAGNOSIS — J4489 Other specified chronic obstructive pulmonary disease: Secondary | ICD-10-CM

## 2024-07-14 DIAGNOSIS — R059 Cough, unspecified: Secondary | ICD-10-CM | POA: Diagnosis not present

## 2024-07-14 DIAGNOSIS — R051 Acute cough: Secondary | ICD-10-CM | POA: Diagnosis not present

## 2024-07-14 LAB — POC COVID19 BINAXNOW: SARS Coronavirus 2 Ag: NEGATIVE

## 2024-07-14 MED ORDER — DOXYCYCLINE HYCLATE 100 MG PO TABS: 100.0000 mg | ORAL_TABLET | Freq: Two times a day (BID) | ORAL | 0 refills | Status: AC

## 2024-07-14 MED ORDER — TRELEGY ELLIPTA 200-62.5-25 MCG/ACT IN AEPB: 1.0000 | INHALATION_SPRAY | Freq: Every day | RESPIRATORY_TRACT | 1 refills | Status: DC

## 2024-07-14 NOTE — Progress Notes (Signed)
 Established Patient Office Visit  Subjective   Patient ID: Kenneth Mcdowell, male    DOB: 09-04-1966  Age: 58 y.o. MRN: [id number]  Chief Complaint  Patient presents with   Follow-up    COPD/HTN. Pt complains of chest feeling raw and raspy states that it hurts when he cough. Complains of constant SOB and is tired all the time. Pt states he feels slight nausea.     HPI  Discussed the use of AI scribe software for clinical note transcription with the patient, who gave verbal consent to proceed.  History of Present Illness Kenneth Mcdowell is a 58 year old male with COPD who presents with persistent shortness of breath and cough.  He uses Advair  and Spiriva  inhalers as prescribed, taking one puff in the morning and one in the evening, and carries a rescue inhaler. He uses his rescue inhaler, albuterol , frequently, approximately five to eight times a day.  He experiences shortness of breath primarily with movement, stating that he becomes 'exhausted really, really fast' when active, but feels better when sitting still. He reports wheezing and a raw, raspy feeling in his chest, which started the day before the visit. He experiences a sore throat when coughing, but there is no change in sputum color, which remains white. He reports that his shortness of breath has not decreased or increased  He feels fatigued and has a slight headache, along with nausea that has not led to vomiting. No fever, chills, sore throat, chest pain, or any new body aches. He has not been around anyone who is sick recently.  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 He has been employed at the same place for 38 years, and his brother, who is also his boss, accommodates his health needs at work.     Review of Systems  Constitutional:  Positive for malaise/fatigue. Negative for chills and fever.  HENT:  Negative for ear  discharge, ear pain and sore throat.   Respiratory:  Positive for cough, sputum production, shortness of breath and wheezing.   Cardiovascular:  Negative for chest pain.  Gastrointestinal:  Positive for nausea. Negative for abdominal pain, diarrhea and vomiting.  Neurological:  Positive for headaches.      Objective:     BP 120/78   Pulse 81   Temp 97.6 F (36.4 C) (Oral)   Ht 6' 1.5 (1.867 m)   Wt 171 lb (77.6 kg)   SpO2 98%   BMI 22.25 kg/m    Physical Exam Vitals and nursing note reviewed.  Constitutional:      Appearance: Normal appearance.  HENT:     Right Ear: Tympanic membrane, ear canal and external ear normal.     Left Ear: Tympanic membrane, ear canal and external ear normal.     Nose:     Right Sinus: No maxillary sinus tenderness or frontal sinus tenderness.     Left Sinus: No maxillary sinus tenderness or frontal sinus tenderness.     Mouth/Throat:     Mouth: Mucous membranes are moist.     Pharynx: Oropharynx is clear.  Cardiovascular:     Rate and Rhythm: Normal rate and regular rhythm.     Heart sounds: Normal heart sounds.  Pulmonary:     Effort: Pulmonary effort is normal.     Breath sounds: Normal breath sounds.  Abdominal:     Comments: Decreased bilateral lower lobes  Neurological:     Mental Status: He is alert.      No results found for any visits on 07/14/24.    The 10-year ASCVD risk score (Arnett DK, et al., 2019) is: 4.9%    Assessment & Plan:   Problem List Items Addressed This Visit       Respiratory   COPD with asthma (HCC)   Relevant Medications   Fluticasone -Umeclidin-Vilant (TRELEGY ELLIPTA ) 200-62.5-25 MCG/ACT AEPB   Other Relevant Orders   Ambulatory referral to Pulmonology   DG Chest 2 View (Completed)   Severe persistent reactive airway disease with acute exacerbation   Relevant Medications   Fluticasone -Umeclidin-Vilant (TRELEGY ELLIPTA ) 200-62.5-25 MCG/ACT AEPB   Other Relevant Orders   DG Chest 2 View  (Completed)   Other Visit Diagnoses       Acute cough    -  Primary   Relevant Orders   POC COVID-19 BinaxNow   DG Chest 2 View (Completed)     Assessment and Plan Assessment & Plan Chronic obstructive pulmonary disease with asthma and acute cough Albuterol  use indicates poor COPD control. Dyspnea, wheezing, and sore throat suggest exacerbation. Negative COVID test. Possible respiratory infection. - Order chest x-ray. - Switch inhaler to Trelegy, one puff daily, pending insurance. - Refer to pulmonologist in [place]. - Schedule follow-up in six weeks or sooner if seen by pulmonologist. - Prescribe doxycycline  100 mg twice daily for seven days.   Return in about 6 weeks (around 08/25/2024) for COPD/Asthama .    Kenneth Crandall, NP

## 2024-07-14 NOTE — Patient Instructions (Signed)
 Nice to see you today  I will be in touch with the xray once I have ti   I am stopping the Adviar and sprivia and starting Trelegy.  Follow up with me in 6 weeks, sooner if you need me

## 2024-07-15 ENCOUNTER — Telehealth: Payer: Self-pay

## 2024-07-15 ENCOUNTER — Other Ambulatory Visit (HOSPITAL_COMMUNITY): Payer: Self-pay

## 2024-07-15 NOTE — Telephone Encounter (Signed)
 Pharmacy Patient Advocate Encounter   Received notification from Onbase that prior authorization for Trelegy Ellipta  200 is required/requested.   Insurance verification completed.   The patient is insured through [employer] .   Per test claim: PA required; PA submitted to above mentioned insurance via Latent Key/confirmation #/[id number] Status is pending

## 2024-07-15 NOTE — Telephone Encounter (Signed)
 Pharmacy Patient Advocate Encounter  Received notification from [employer] that Prior Authorization for Trelegy Ellipta  200 has been APPROVED from 07/15/24 to 07/15/25. Ran test claim, Copay is $4.00. This test claim was processed through [facility]- copay amounts may vary at other pharmacies due to pharmacy/plan contracts, or as the patient moves through the different stages of their insurance plan.     PA #/Case ID/Reference #: [id number]

## 2024-07-16 ENCOUNTER — Other Ambulatory Visit (HOSPITAL_COMMUNITY): Payer: Self-pay

## 2024-07-16 ENCOUNTER — Ambulatory Visit: Payer: Self-pay | Admitting: Nurse Practitioner

## 2024-08-25 ENCOUNTER — Ambulatory Visit: Admitting: Nurse Practitioner
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
## 2024-08-26 ENCOUNTER — Ambulatory Visit

## 2024-08-26 ENCOUNTER — Telehealth: Payer: Self-pay

## 2024-08-26 VITALS — BP 124/86 | HR 78 | Ht 73.0 in | Wt 174.0 lb

## 2024-08-26 DIAGNOSIS — J4489 Other specified chronic obstructive pulmonary disease: Secondary | ICD-10-CM | POA: Diagnosis not present

## 2024-08-26 DIAGNOSIS — J449 Chronic obstructive pulmonary disease, unspecified: Secondary | ICD-10-CM

## 2024-08-26 MED ORDER — NEBULIZER/TUBING/MOUTHPIECE KIT: 1.0000 | PACK | Freq: Once | 10 refills | Status: AC

## 2024-08-26 MED ORDER — ALBUTEROL SULFATE HFA 108 (90 BASE) MCG/ACT IN AERS
1.0000 | INHALATION_SPRAY | Freq: Four times a day (QID) | RESPIRATORY_TRACT | 12 refills | Status: AC | PRN
Start: 2024-08-26 — End: ?

## 2024-08-26 MED ORDER — TRELEGY ELLIPTA 200-62.5-25 MCG/ACT IN AEPB: 1.0000 | INHALATION_SPRAY | Freq: Every day | RESPIRATORY_TRACT | 12 refills | Status: AC

## 2024-08-26 NOTE — Assessment & Plan Note (Signed)
 COPD Exacerbations in the last year.   Chronic bronchitis phenotype  58 years old with history of COPD/asthma for many years.  He had recent flares requiring prednisone  a month ago.  His cough and sputum production has been a little more frequent. Patient ran out of medications and I refilled all his inhalers today.  He uses nebulizer at home as well. I will order PFTs, a CT chest to have a baseline.   Patient does not meet lung cancer screening, however he had some abnormalities in prior CT scan that I would follow up. Patient stopped smoking cigarettes 30 years ago, no vaping, however sometimes inhale a pipe 1-2 times a day.  Orders:   Fluticasone -Umeclidin-Vilant (TRELEGY ELLIPTA ) 200-62.5-25 MCG/ACT AEPB; Inhale 1 puff into the lungs daily.   albuterol  (VENTOLIN  HFA) 108 (90 Base) MCG/ACT inhaler; Inhale 1-2 puffs into the lungs every 6 (six) hours as needed for wheezing or shortness of breath.   Pulmonary Function Test; Future   CT CHEST HIGH RESOLUTION; Future

## 2024-08-26 NOTE — Telephone Encounter (Signed)
 Disregard encounter

## 2024-09-08 ENCOUNTER — Ambulatory Visit (HOSPITAL_COMMUNITY)
Admission: RE | Admit: 2024-09-08 | Discharge: 2024-09-08 | Disposition: A | Discharge status: Home / Self Care | Source: Ambulatory Visit

## 2024-09-08 DIAGNOSIS — J4489 Other specified chronic obstructive pulmonary disease: Secondary | ICD-10-CM | POA: Diagnosis not present

## 2024-09-08 DIAGNOSIS — J449 Chronic obstructive pulmonary disease, unspecified: Secondary | ICD-10-CM | POA: Insufficient documentation

## 2024-09-11 ENCOUNTER — Ambulatory Visit: Payer: Self-pay

## 2024-09-11 DIAGNOSIS — R911 Solitary pulmonary nodule: Secondary | ICD-10-CM

## 2024-12-21 ENCOUNTER — Ambulatory Visit: Payer: Medicaid Other | Admitting: Family Medicine

## 2024-12-23 ENCOUNTER — Ambulatory Visit (HOSPITAL_COMMUNITY)

## 2024-12-28 ENCOUNTER — Ambulatory Visit
